# Patient Record
Sex: Male | Born: 1980
Health system: Southern US, Community
[De-identification: ages and names within clinical notes are randomized; demographics above are authoritative.]

## PROBLEM LIST (undated history)

## (undated) DIAGNOSIS — M109 Gout, unspecified: Secondary | ICD-10-CM

## (undated) DIAGNOSIS — K589 Irritable bowel syndrome without diarrhea: Secondary | ICD-10-CM

## (undated) DIAGNOSIS — Z87442 Personal history of urinary calculi: Secondary | ICD-10-CM

## (undated) DIAGNOSIS — M2550 Pain in unspecified joint: Secondary | ICD-10-CM

## (undated) DIAGNOSIS — I10 Essential (primary) hypertension: Secondary | ICD-10-CM

## (undated) DIAGNOSIS — G4730 Sleep apnea, unspecified: Secondary | ICD-10-CM

## (undated) DIAGNOSIS — N289 Disorder of kidney and ureter, unspecified: Secondary | ICD-10-CM

## (undated) DIAGNOSIS — Z8739 Personal history of other diseases of the musculoskeletal system and connective tissue: Secondary | ICD-10-CM

## (undated) DIAGNOSIS — F419 Anxiety disorder, unspecified: Secondary | ICD-10-CM

## (undated) DIAGNOSIS — N200 Calculus of kidney: Secondary | ICD-10-CM

## (undated) DIAGNOSIS — R600 Localized edema: Secondary | ICD-10-CM

## (undated) DIAGNOSIS — M5126 Other intervertebral disc displacement, lumbar region: Secondary | ICD-10-CM

## (undated) HISTORY — DX: Sleep apnea, unspecified: G47.30

## (undated) HISTORY — DX: Irritable bowel syndrome, unspecified: K58.9

## (undated) HISTORY — DX: Disorder of kidney and ureter, unspecified: N28.9

## (undated) HISTORY — DX: Essential (primary) hypertension: I10

## (undated) HISTORY — DX: Anxiety disorder, unspecified: F41.9

## (undated) HISTORY — PX: KIDNEY STONE SURGERY: SHX686

## (undated) HISTORY — DX: Calculus of kidney: N20.0

## (undated) HISTORY — DX: Pain in unspecified joint: M25.50

## (undated) HISTORY — DX: Personal history of other diseases of the musculoskeletal system and connective tissue: Z87.39

## (undated) HISTORY — PX: BACK SURGERY: SHX140

## (undated) HISTORY — DX: Gout, unspecified: M10.9

## (undated) HISTORY — DX: Localized edema: R60.0

---

## 2008-01-14 ENCOUNTER — Emergency Department (HOSPITAL_COMMUNITY): Admission: EM | Admit: 2008-01-14 | Discharge: 2008-01-14 | Payer: Self-pay | Admitting: Emergency Medicine

## 2008-01-22 ENCOUNTER — Ambulatory Visit (HOSPITAL_COMMUNITY): Admission: RE | Admit: 2008-01-22 | Discharge: 2008-01-22 | Payer: Self-pay | Admitting: Internal Medicine

## 2008-07-19 ENCOUNTER — Ambulatory Visit (HOSPITAL_COMMUNITY): Admission: RE | Admit: 2008-07-19 | Discharge: 2008-07-19 | Payer: Self-pay | Admitting: Family Medicine

## 2008-08-01 ENCOUNTER — Encounter (HOSPITAL_COMMUNITY): Admission: RE | Admit: 2008-08-01 | Discharge: 2008-08-31 | Payer: Self-pay | Admitting: Neurosurgery

## 2008-11-04 ENCOUNTER — Emergency Department (HOSPITAL_COMMUNITY): Admission: EM | Admit: 2008-11-04 | Discharge: 2008-11-04 | Payer: Self-pay | Admitting: Emergency Medicine

## 2009-01-19 ENCOUNTER — Emergency Department (HOSPITAL_COMMUNITY): Admission: EM | Admit: 2009-01-19 | Discharge: 2009-01-19 | Payer: Self-pay | Admitting: Emergency Medicine

## 2009-04-25 ENCOUNTER — Ambulatory Visit (HOSPITAL_COMMUNITY): Admission: RE | Admit: 2009-04-25 | Discharge: 2009-04-25 | Payer: Self-pay | Admitting: Neurosurgery

## 2009-11-04 ENCOUNTER — Ambulatory Visit (HOSPITAL_COMMUNITY): Admission: RE | Admit: 2009-11-04 | Discharge: 2009-11-04 | Payer: Self-pay | Admitting: Family Medicine

## 2010-03-30 ENCOUNTER — Other Ambulatory Visit: Payer: Self-pay | Admitting: Dental General Practice

## 2010-03-30 ENCOUNTER — Other Ambulatory Visit (HOSPITAL_COMMUNITY)
Admission: RE | Admit: 2010-03-30 | Discharge: 2010-03-30 | Disposition: A | Discharge status: Home / Self Care | Payer: Commercial Managed Care - PPO | Source: Ambulatory Visit | Attending: Dental General Practice | Admitting: Dental General Practice

## 2010-03-30 DIAGNOSIS — D1039 Benign neoplasm of other parts of mouth: Secondary | ICD-10-CM | POA: Insufficient documentation

## 2012-03-21 ENCOUNTER — Ambulatory Visit (HOSPITAL_COMMUNITY)
Admission: RE | Admit: 2012-03-21 | Discharge: 2012-03-21 | Disposition: A | Discharge status: Home / Self Care | Payer: 59 | Source: Ambulatory Visit | Attending: Family Medicine | Admitting: Family Medicine

## 2012-03-21 ENCOUNTER — Other Ambulatory Visit: Payer: Self-pay | Admitting: Family Medicine

## 2012-03-21 DIAGNOSIS — R9389 Abnormal findings on diagnostic imaging of other specified body structures: Secondary | ICD-10-CM | POA: Insufficient documentation

## 2012-03-21 DIAGNOSIS — R300 Dysuria: Secondary | ICD-10-CM | POA: Insufficient documentation

## 2012-03-21 DIAGNOSIS — N200 Calculus of kidney: Secondary | ICD-10-CM | POA: Insufficient documentation

## 2012-03-21 DIAGNOSIS — R319 Hematuria, unspecified: Secondary | ICD-10-CM | POA: Insufficient documentation

## 2012-06-26 ENCOUNTER — Other Ambulatory Visit: Payer: Self-pay | Admitting: *Deleted

## 2012-06-26 DIAGNOSIS — N200 Calculus of kidney: Secondary | ICD-10-CM

## 2012-06-27 ENCOUNTER — Encounter: Payer: Self-pay | Admitting: *Deleted

## 2012-06-30 LAB — STONE ANALYSIS

## 2012-07-06 ENCOUNTER — Telehealth: Payer: Self-pay | Admitting: Family Medicine

## 2012-07-06 NOTE — Telephone Encounter (Signed)
Message left to call office

## 2012-07-06 NOTE — Telephone Encounter (Signed)
Pt is calling to check on kidney stone that was sent off to see if lab had gotten back with you yet because he is concerned on what he needs to change in his diet.

## 2013-03-15 ENCOUNTER — Encounter: Payer: Self-pay | Admitting: Family Medicine

## 2013-03-15 ENCOUNTER — Ambulatory Visit (INDEPENDENT_AMBULATORY_CARE_PROVIDER_SITE_OTHER): Payer: 59 | Admitting: Family Medicine

## 2013-03-15 VITALS — BP 134/82 | Temp 99.9°F | Ht 69.0 in | Wt 263.0 lb

## 2013-03-15 DIAGNOSIS — B9789 Other viral agents as the cause of diseases classified elsewhere: Secondary | ICD-10-CM

## 2013-03-15 DIAGNOSIS — J209 Acute bronchitis, unspecified: Secondary | ICD-10-CM

## 2013-03-15 DIAGNOSIS — B349 Viral infection, unspecified: Secondary | ICD-10-CM

## 2013-03-15 MED ORDER — BENZONATATE 200 MG PO CAPS: 200.0000 mg | ORAL_CAPSULE | Freq: Three times a day (TID) | ORAL | Status: DC | PRN

## 2013-03-15 MED ORDER — HYDROCODONE-HOMATROPINE 5-1.5 MG/5ML PO SYRP: 5.0000 mL | ORAL_SOLUTION | Freq: Three times a day (TID) | ORAL | Status: DC | PRN

## 2013-03-15 MED ORDER — AZITHROMYCIN 250 MG PO TABS
ORAL_TABLET | ORAL | Status: DC
Start: 2013-03-15 — End: 2013-03-19

## 2013-03-15 NOTE — Progress Notes (Signed)
   Subjective:    Patient ID: [name] West, male    DOB: Apr 01, 1980, 33 y.o.   MRN: [id number]  Cough This is a new problem. The current episode started in the past 7 days. Associated symptoms include a fever. Associated symptoms comments: congestion.   Some wheeze Tight in lungs No chills No n v Mild headache   Review of Systems  Constitutional: Positive for fever.  Respiratory: Positive for cough.    He denies headaches wheezing difficulty breathing he denies shortness of breath he does relate fever    Objective:   Physical Exam Eardrums normal throat is normal neck supple lungs clear there is some upper bronchial chest congestion noted.       Assessment & Plan:  Viral syndrome with secondary bronchitis antibiotics prescribed warning signs discussed  Cough medication prescribed cautioned drowsiness best to use syrup only at nighttime

## 2013-03-19 ENCOUNTER — Ambulatory Visit (INDEPENDENT_AMBULATORY_CARE_PROVIDER_SITE_OTHER): Payer: 59 | Admitting: Family Medicine

## 2013-03-19 ENCOUNTER — Encounter: Payer: Self-pay | Admitting: Family Medicine

## 2013-03-19 VITALS — BP 138/90 | Temp 98.7°F | Ht 69.0 in | Wt 264.0 lb

## 2013-03-19 DIAGNOSIS — S39012A Strain of muscle, fascia and tendon of lower back, initial encounter: Secondary | ICD-10-CM

## 2013-03-19 DIAGNOSIS — M542 Cervicalgia: Secondary | ICD-10-CM

## 2013-03-19 DIAGNOSIS — J209 Acute bronchitis, unspecified: Secondary | ICD-10-CM

## 2013-03-19 MED ORDER — METHOCARBAMOL 500 MG PO TABS: 500.0000 mg | ORAL_TABLET | Freq: Four times a day (QID) | ORAL | Status: DC | PRN

## 2013-03-19 MED ORDER — LEVOFLOXACIN 500 MG PO TABS: 500.0000 mg | ORAL_TABLET | Freq: Every day | ORAL | Status: DC

## 2013-03-19 NOTE — Progress Notes (Signed)
   Subjective:    Patient ID: [name] West, male    DOB: July 24, 1980, 33 y.o.   MRN: [id number]  HPI Pt is here today for a f/u on 3/5. He was seen for bronchitis.   Last night he had a low grade fever. Please see below  He does now have back pain from coughing so much. The cough is getting better though.  He also relates straining a muscle in his back causes pain discomfort spasms. Denies any injury otherwise. States it happened innocently.   Review of Systems Denies high fevers chills sweats he does relate congestion coughing drainage. He had flulike illness last week him through the weekend increased congestion and coughing no wheezing or difficulty breathing    Objective:   Physical Exam Eardrums normal sinus normal throat normal neck supple lungs clear heart regular low back mild tenderness slight decreased range of motion due to pain       Assessment & Plan:  Muscle strain/ligament strain-muscle relaxer was prescribed. He will use this it should help cautioned drowsiness. Stretching exercises recommended as well. He was warned that muscle relaxers is not something he should be taking ongoing.  Mild progressive bronchitis with sinus infections which antibiotics should gradually get better with this.

## 2014-01-02 ENCOUNTER — Ambulatory Visit: Payer: 59 | Admitting: Family Medicine

## 2014-09-13 ENCOUNTER — Ambulatory Visit (INDEPENDENT_AMBULATORY_CARE_PROVIDER_SITE_OTHER): Payer: PRIVATE HEALTH INSURANCE | Admitting: Nurse Practitioner

## 2014-09-13 ENCOUNTER — Encounter: Payer: Self-pay | Admitting: Nurse Practitioner

## 2014-09-13 VITALS — BP 132/76 | Ht 69.0 in | Wt 283.4 lb

## 2014-09-13 DIAGNOSIS — R609 Edema, unspecified: Secondary | ICD-10-CM | POA: Diagnosis not present

## 2014-09-13 DIAGNOSIS — R002 Palpitations: Secondary | ICD-10-CM

## 2014-09-13 DIAGNOSIS — R5383 Other fatigue: Secondary | ICD-10-CM

## 2014-09-13 DIAGNOSIS — Z13220 Encounter for screening for lipoid disorders: Secondary | ICD-10-CM

## 2014-09-13 DIAGNOSIS — F419 Anxiety disorder, unspecified: Secondary | ICD-10-CM | POA: Diagnosis not present

## 2014-09-13 DIAGNOSIS — R0683 Snoring: Secondary | ICD-10-CM | POA: Diagnosis not present

## 2014-09-13 DIAGNOSIS — F430 Acute stress reaction: Secondary | ICD-10-CM

## 2014-09-13 DIAGNOSIS — F411 Generalized anxiety disorder: Secondary | ICD-10-CM

## 2014-09-13 MED ORDER — CITALOPRAM HYDROBROMIDE 20 MG PO TABS: ORAL_TABLET | ORAL | Status: DC

## 2014-09-13 MED ORDER — HYDROCHLOROTHIAZIDE 25 MG PO TABS: 25.0000 mg | ORAL_TABLET | Freq: Every day | ORAL | Status: DC

## 2014-09-13 MED ORDER — CLONAZEPAM 0.5 MG PO TABS: ORAL_TABLET | ORAL | Status: DC

## 2014-09-14 ENCOUNTER — Encounter: Payer: Self-pay | Admitting: Nurse Practitioner

## 2014-09-14 DIAGNOSIS — F430 Acute stress reaction: Secondary | ICD-10-CM

## 2014-09-14 DIAGNOSIS — F411 Generalized anxiety disorder: Secondary | ICD-10-CM | POA: Insufficient documentation

## 2014-09-14 NOTE — Progress Notes (Signed)
Subjective:  Presents for c/o trouble going to sleep. May take several hours. Wakes up 1-2 times per night but goes back to sleep easily. Fatigue during the day especially in the afternoon. Job requires significant driving. Wife present today. Loud snoring with pauses. Steady weight gain over past 5 years. Under tremendous stress with his job. Has a recent episode where he got upset one morning at work, immediately became flushed with pounding heart rate and mildly increased heart rate which lasted for hours. Later in the day had good news and symptoms immediately subsided. No CP/ischemic type pain or SOB with activity. Edema lower legs at end of work day. No cough or orthopnea. No acid reflux or abd pain. Mild emotional lability. No added salt to diet but eats out frequently. His father probably has heart disease but has refused work up. [place] questionnaire indicates symptoms of OSA.   Objective:   BP 132/76 mmHg  Ht 5\' 9"  (1.753 m)  Wt 283 lb 6 oz (128.538 kg)  BMI 41.83 kg/m2 NAD. Alert, oriented. Significant central obesity noted with large waist circumference. Lungs clear. Heart RRR. No murmur or gallop. EKG normal. Carotids no bruits or thrills. Lower extremities no edema. Abdomen obese, non distended, non tender.   Assessment:  Problem List Items Addressed This Visit      Other   Anxiety as acute reaction to exceptional stress   Relevant Medications   citalopram (CELEXA) 20 MG tablet   Other Relevant Orders   EKG 12-Lead    Other Visit Diagnoses    Palpitations    -  Primary    Relevant Orders    EKG 12-Lead    Peripheral edema        Relevant Orders    Hepatic function panel    Basic metabolic panel    Snoring        Relevant Orders    Ambulatory referral to Sleep Studies    TSH    Other fatigue        Relevant Orders    Ambulatory referral to Sleep Studies    Lipid screening        Relevant Orders    Lipid panel      Plan:  Meds ordered this encounter  Medications   . citalopram (CELEXA) 20 MG tablet    Sig: 1/2 tab po qhs x 6 d then one po qhs    Dispense:  30 tablet    Refill:  0    Order Specific Question:  Supervising Provider    Answer:  Mikey Kirschner [[id number]]  . hydrochlorothiazide (HYDRODIURIL) 25 MG tablet    Sig: Take 1 tablet (25 mg total) by mouth daily. For fluid and BP    Dispense:  30 tablet    Refill:  2    Order Specific Question:  Supervising Provider    Answer:  Mikey Kirschner [[id number]]  . clonazePAM (KLONOPIN) 0.5 MG tablet    Sig: 1/2-1 tab po BID prn anxiety    Dispense:  30 tablet    Refill:  0    Order Specific Question:  Supervising Provider    Answer:  Mikey Kirschner [[id number]]   Discussed importance of weight loss esp around abd area. Limit sodium in diet. Discussed importance of stress reduction. Referred for HBST for probable OSA. Cautioned patient about risk of falling asleep while driving with untreated OSA.  Return in about 1 month (around 10/13/2014).

## 2014-09-15 LAB — BASIC METABOLIC PANEL
BUN / CREAT RATIO: 13 (ref 8–19)
BUN: 12 mg/dL (ref 6–20)
CO2: 26 mmol/L (ref 18–29)
CREATININE: 0.92 mg/dL (ref 0.76–1.27)
Calcium: 9.5 mg/dL (ref 8.7–10.2)
Chloride: 99 mmol/L (ref 97–108)
GFR calc Af Amer: 124 mL/min/{1.73_m2} (ref >59)
GFR, EST NON AFRICAN AMERICAN: 107 mL/min/{1.73_m2} (ref >59)
Glucose: 93 mg/dL (ref 65–99)
Potassium: 5 mmol/L (ref 3.5–5.2)
SODIUM: 140 mmol/L (ref 134–144)

## 2014-09-15 LAB — HEPATIC FUNCTION PANEL
ALBUMIN: 4 g/dL (ref 3.5–5.5)
ALK PHOS: 97 IU/L (ref 39–117)
ALT: 29 IU/L (ref 0–44)
AST: 18 IU/L (ref 0–40)
BILIRUBIN, DIRECT: 0.16 mg/dL (ref 0.00–0.40)
Bilirubin Total: 0.5 mg/dL (ref 0.0–1.2)
TOTAL PROTEIN: 6.6 g/dL (ref 6.0–8.5)

## 2014-09-15 LAB — LIPID PANEL
Chol/HDL Ratio: 4.7 ratio units (ref 0.0–5.0)
Cholesterol, Total: 178 mg/dL (ref 100–199)
HDL: 38 mg/dL — ABNORMAL LOW (ref >39)
LDL Calculated: 126 mg/dL — ABNORMAL HIGH (ref 0–99)
Triglycerides: 69 mg/dL (ref 0–149)
VLDL Cholesterol Cal: 14 mg/dL (ref 5–40)

## 2014-09-15 LAB — TSH: TSH: 2.18 u[IU]/mL (ref 0.450–4.500)

## 2014-09-17 ENCOUNTER — Encounter: Payer: Self-pay | Admitting: Nurse Practitioner

## 2014-10-04 ENCOUNTER — Other Ambulatory Visit (HOSPITAL_COMMUNITY): Payer: Self-pay | Admitting: Respiratory Therapy

## 2014-10-04 DIAGNOSIS — R0683 Snoring: Secondary | ICD-10-CM

## 2014-10-04 DIAGNOSIS — S058X9A Other injuries of unspecified eye and orbit, initial encounter: Secondary | ICD-10-CM

## 2014-10-14 ENCOUNTER — Encounter: Payer: Self-pay | Admitting: Nurse Practitioner

## 2014-10-14 ENCOUNTER — Ambulatory Visit (INDEPENDENT_AMBULATORY_CARE_PROVIDER_SITE_OTHER): Payer: PRIVATE HEALTH INSURANCE | Admitting: Nurse Practitioner

## 2014-10-14 VITALS — BP 122/76 | Ht 69.0 in | Wt 273.2 lb

## 2014-10-14 DIAGNOSIS — F411 Generalized anxiety disorder: Secondary | ICD-10-CM

## 2014-10-14 DIAGNOSIS — R609 Edema, unspecified: Secondary | ICD-10-CM | POA: Diagnosis not present

## 2014-10-14 DIAGNOSIS — F419 Anxiety disorder, unspecified: Secondary | ICD-10-CM | POA: Diagnosis not present

## 2014-10-14 DIAGNOSIS — F430 Acute stress reaction: Principal | ICD-10-CM

## 2014-10-14 MED ORDER — HYDROCHLOROTHIAZIDE 25 MG PO TABS: 25.0000 mg | ORAL_TABLET | Freq: Every day | ORAL | Status: DC

## 2014-10-14 MED ORDER — CITALOPRAM HYDROBROMIDE 20 MG PO TABS: ORAL_TABLET | ORAL | Status: DC

## 2014-10-14 MED ORDER — CLONAZEPAM 0.5 MG PO TABS: ORAL_TABLET | ORAL | Status: DC

## 2014-10-14 NOTE — Progress Notes (Signed)
Subjective:  Presents for recheck. Doing well on Celexa. Has only taken 2 klonopin at the very beginning before the Celexa started working. No further palpitations. No CP/ischemic type pain or SOB. Sleeping better. Has improved his diet. Has been called about his HBST but has not responded at this point.   Objective:   BP 122/76 mmHg  Ht 5\' 9"  (1.753 m)  Wt 273 lb 4 oz (123.945 kg)  BMI 40.33 kg/m2 NAD. Alert, oriented. Lungs clear. Heart RRR. Weight loss 10 lbs since last visit.   Assessment:  Problem List Items Addressed This Visit      Other   Anxiety as acute reaction to exceptional stress - Primary   Relevant Medications   citalopram (CELEXA) 20 MG tablet    Other Visit Diagnoses    Peripheral edema          Plan:  Meds ordered this encounter  Medications  . citalopram (CELEXA) 20 MG tablet    Sig: one po qhs    Dispense:  30 tablet    Refill:  5    Order Specific Question:  Supervising Provider    Answer:  Mikey Kirschner [[id number]]  . clonazePAM (KLONOPIN) 0.5 MG tablet    Sig: 1/2-1 tab po BID prn anxiety    Dispense:  30 tablet    Refill:  0    Order Specific Question:  Supervising Provider    Answer:  Mikey Kirschner [[id number]]  . hydrochlorothiazide (HYDRODIURIL) 25 MG tablet    Sig: Take 1 tablet (25 mg total) by mouth daily. For fluid and BP    Dispense:  30 tablet    Refill:  5    Order Specific Question:  Supervising Provider    Answer:  Mikey Kirschner [[id number]]   Continue current meds and weight loss efforts.  Return in about 6 months (around 04/14/2015) for recheck. Call back sooner if needed.

## 2015-03-12 ENCOUNTER — Other Ambulatory Visit: Payer: Self-pay | Admitting: *Deleted

## 2015-03-12 MED ORDER — OSELTAMIVIR PHOSPHATE 75 MG PO CAPS: ORAL_CAPSULE | ORAL | Status: DC

## 2015-04-14 ENCOUNTER — Encounter: Payer: Self-pay | Admitting: Family Medicine

## 2015-04-14 ENCOUNTER — Ambulatory Visit (INDEPENDENT_AMBULATORY_CARE_PROVIDER_SITE_OTHER): Payer: PRIVATE HEALTH INSURANCE | Admitting: Family Medicine

## 2015-04-14 VITALS — BP 126/78 | Ht 69.0 in | Wt 269.5 lb

## 2015-04-14 DIAGNOSIS — F419 Anxiety disorder, unspecified: Secondary | ICD-10-CM

## 2015-04-14 DIAGNOSIS — R609 Edema, unspecified: Secondary | ICD-10-CM | POA: Diagnosis not present

## 2015-04-14 DIAGNOSIS — F411 Generalized anxiety disorder: Secondary | ICD-10-CM

## 2015-04-14 DIAGNOSIS — F430 Acute stress reaction: Secondary | ICD-10-CM

## 2015-04-14 MED ORDER — CLONAZEPAM 0.5 MG PO TABS: ORAL_TABLET | ORAL | Status: DC

## 2015-04-14 MED ORDER — CITALOPRAM HYDROBROMIDE 20 MG PO TABS: ORAL_TABLET | ORAL | Status: DC

## 2015-04-14 MED ORDER — HYDROCHLOROTHIAZIDE 25 MG PO TABS: 25.0000 mg | ORAL_TABLET | Freq: Every day | ORAL | Status: DC

## 2015-04-14 NOTE — Progress Notes (Signed)
Subjective:    Patient ID: [name] West, male    DOB: 1980/10/22, 35 y.o.   MRN: [id number]  Anxiety Presents for follow-up visit. Patient reports no chest pain or confusion. Symptoms occur occasionally. The severity of symptoms is mild. The quality of sleep is fair.   Compliance with medications is 76-100%.   Patient states that he has no concerns at this time.   Stress levels overall doing well trying to eat healthy try to lose weight try to exercise some in addition to this sleeping okay denies any chest tightness pressure pain shortness breath. Patient does use HCTZ for pedal edema states his pressure and edema is been doing well  Review of Systems  Constitutional: Negative for activity change, appetite change and fatigue.  HENT: Negative for congestion.   Respiratory: Negative for cough.   Cardiovascular: Negative for chest pain.  Gastrointestinal: Negative for abdominal pain.  Endocrine: Negative for polydipsia and polyphagia.  Neurological: Negative for weakness.  Psychiatric/Behavioral: Negative for confusion.       Objective:   Physical Exam  Constitutional: He appears well-nourished. No distress.  Cardiovascular: Normal rate, regular rhythm and normal heart sounds.   No murmur heard. Pulmonary/Chest: Effort normal and breath sounds normal. No respiratory distress.  Musculoskeletal: He exhibits no edema.  Lymphadenopathy:    He has no cervical adenopathy.  Neurological: He is alert.  Psychiatric: His behavior is normal.  Vitals reviewed.         Assessment & Plan:  Pedal edema-HCTZ helping patient taking it every day but not taking potassium check met 7  Intermittent dizziness only happened twice precautions on how to avoid this were discussed. Follow-up if ongoing troubles.  Hyperlipidemia recheck in the fall  Anxiety issues Celexa-takes daily- and Klonopin-does not use often- doing well continue these

## 2015-04-21 ENCOUNTER — Other Ambulatory Visit: Payer: Self-pay | Admitting: Family Medicine

## 2015-04-21 LAB — BASIC METABOLIC PANEL
BUN: 14 mg/dL (ref 7–25)
CHLORIDE: 105 mmol/L (ref 98–110)
CO2: 27 mmol/L (ref 20–31)
CREATININE: 0.73 mg/dL (ref 0.60–1.35)
Calcium: 8.9 mg/dL (ref 8.6–10.3)
GLUCOSE: 99 mg/dL (ref 65–99)
Potassium: 4.5 mmol/L (ref 3.5–5.3)
Sodium: 140 mmol/L (ref 135–146)

## 2015-10-14 ENCOUNTER — Ambulatory Visit: Payer: PRIVATE HEALTH INSURANCE | Admitting: Family Medicine

## 2015-11-07 ENCOUNTER — Ambulatory Visit (INDEPENDENT_AMBULATORY_CARE_PROVIDER_SITE_OTHER): Payer: 59 | Admitting: Family Medicine

## 2015-11-07 ENCOUNTER — Encounter: Payer: Self-pay | Admitting: Family Medicine

## 2015-11-07 VITALS — BP 130/82 | Ht 69.0 in | Wt 267.4 lb

## 2015-11-07 DIAGNOSIS — E7849 Other hyperlipidemia: Secondary | ICD-10-CM

## 2015-11-07 DIAGNOSIS — E784 Other hyperlipidemia: Secondary | ICD-10-CM

## 2015-11-07 DIAGNOSIS — Z87442 Personal history of urinary calculi: Secondary | ICD-10-CM

## 2015-11-07 DIAGNOSIS — G4733 Obstructive sleep apnea (adult) (pediatric): Secondary | ICD-10-CM

## 2015-11-07 MED ORDER — CLONAZEPAM 0.5 MG PO TABS: ORAL_TABLET | ORAL | 5 refills | Status: DC

## 2015-11-07 MED ORDER — CITALOPRAM HYDROBROMIDE 20 MG PO TABS: ORAL_TABLET | ORAL | 5 refills | Status: DC

## 2015-11-07 NOTE — Progress Notes (Signed)
   Subjective:    Patient ID: [name] West, male    DOB: Jun 04, 1980, 35 y.o.   MRN: [id number]  Anxiety  Presents for follow-up visit.    Patient would like to discuss HCTZ, and discuss blood pressure being elevated during periods of pain and have sleep study rescheduled, and tonsil stones.  He states his moods overall doing good denies being depressed states medication helps keep him even Patient does take take HCTZ to prevent kidney stones he has calcium oxalate stones we talked at length about how to prevent knees Patient states blood pressure does go up during times of pain but other times blood pressures good Patient does state he gets stones in his, so region that bothers him but does not cause any pain or discomfort he just wonders if this is normal Patient does state he pauses breathing when he sleeps plus also snores he had recent surgery and they told him during recovery he had apnea spells. Patient needs sleep study. Never got the other sleep study.   Review of Systems     Objective:   Physical Exam  Lungs clear no crackles heart regular pulse normal moderate obesity extremities no edema skin warm dry      Assessment & Plan:  Tonsillar stones-supportive measures only no need for any type of surgery or antibiotics gargle when necessary  Probable sleep apnea him in his history. Recommend sleep study split protocol if possible more than likely will need CPAP  Kidney stones-we went over dietary measures adequate fluid intake plus continue HCTZ lab work ordered  Patient was told is normal for blood pressure go up during times of pain, I do not recommend antihypertensive  Stress-related issues doing well overall moods are doing good continue current measures  Obesity patient was encouraged to lose weight exercise watch diet  This patient would benefit from a sleep study in the facility. Not home sleep study.

## 2015-11-11 ENCOUNTER — Encounter: Payer: Self-pay | Admitting: Family Medicine

## 2015-11-11 NOTE — Progress Notes (Signed)
So be it 

## 2015-11-14 ENCOUNTER — Encounter: Payer: Self-pay | Admitting: Family Medicine

## 2015-11-14 ENCOUNTER — Other Ambulatory Visit: Payer: Self-pay | Admitting: Family Medicine

## 2015-11-14 LAB — BASIC METABOLIC PANEL WITH GFR
BUN: 13 mg/dL (ref 7–25)
CO2: 26 mmol/L (ref 20–31)
Calcium: 8.9 mg/dL (ref 8.6–10.3)
Chloride: 103 mmol/L (ref 98–110)
Creat: 1.01 mg/dL (ref 0.60–1.35)
Glucose, Bld: 91 mg/dL (ref 65–99)
Potassium: 4.2 mmol/L (ref 3.5–5.3)
Sodium: 140 mmol/L (ref 135–146)

## 2015-11-14 LAB — HEPATIC FUNCTION PANEL
ALT: 18 U/L (ref 9–46)
AST: 16 U/L (ref 10–40)
Albumin: 4 g/dL (ref 3.6–5.1)
Alkaline Phosphatase: 91 U/L (ref 40–115)
Bilirubin, Direct: 0.1 mg/dL
Indirect Bilirubin: 0.5 mg/dL (ref 0.2–1.2)
Total Bilirubin: 0.6 mg/dL (ref 0.2–1.2)
Total Protein: 6.8 g/dL (ref 6.1–8.1)

## 2015-11-14 LAB — LIPID PANEL
Cholesterol: 173 mg/dL (ref 125–200)
HDL: 40 mg/dL
LDL Cholesterol: 121 mg/dL
Total CHOL/HDL Ratio: 4.3 ratio
Triglycerides: 60 mg/dL
VLDL: 12 mg/dL

## 2016-01-15 ENCOUNTER — Other Ambulatory Visit (HOSPITAL_BASED_OUTPATIENT_CLINIC_OR_DEPARTMENT_OTHER): Payer: Self-pay

## 2016-01-15 DIAGNOSIS — R0683 Snoring: Secondary | ICD-10-CM

## 2016-01-15 DIAGNOSIS — G4730 Sleep apnea, unspecified: Secondary | ICD-10-CM

## 2016-02-01 NOTE — Procedures (Deleted)
NAME: [name] West DATE OF BIRTH:  [birth year] MEDICAL RECORD NUMBER [id number]  LOCATION: [facility]  PHYSICIAN: Deetta Perla  DATE OF STUDY: 02/01/2016  SLEEP STUDY TYPE: Out of Center Sleep Test                REFERRING PHYSICIAN: Kathyrn Drown, MD  INDICATION FOR STUDY: ***  EPWORTH SLEEPINESS SCORE:   HEIGHT:    WEIGHT:      There is no height or weight on file to calculate BMI.  NECK SIZE:   in.  MEDICATIONS: ***  IMPRESSION:  ***    RECOMMENDATION:  ***   [name], [place], American Board of Sleep Medicine  ELECTRONICALLY SIGNED ON:  02/01/2016, 7:43 PM [place] PH: [phone]   FX: [phone] [place]

## 2016-02-15 ENCOUNTER — Ambulatory Visit: Payer: Commercial Managed Care - PPO

## 2016-02-22 ENCOUNTER — Ambulatory Visit: Payer: 59 | Attending: Family Medicine

## 2016-02-22 DIAGNOSIS — R0683 Snoring: Secondary | ICD-10-CM | POA: Diagnosis not present

## 2016-02-22 DIAGNOSIS — G4733 Obstructive sleep apnea (adult) (pediatric): Secondary | ICD-10-CM | POA: Insufficient documentation

## 2016-02-22 DIAGNOSIS — G4730 Sleep apnea, unspecified: Secondary | ICD-10-CM

## 2016-02-27 ENCOUNTER — Telehealth: Payer: Self-pay | Admitting: Family Medicine

## 2016-02-27 MED ORDER — AMOXICILLIN 500 MG PO CAPS: 500.0000 mg | ORAL_CAPSULE | Freq: Three times a day (TID) | ORAL | 0 refills | Status: DC

## 2016-02-27 NOTE — Telephone Encounter (Signed)
Nurse's-I did discuss this case with the wife patient having head congestion drainage but no fever or chills some sore throat. Hard to know for certain what the patient has more than likely a virus. I did let the family know that we would send a prescription to [facility]-amoxicillin 500 mg 1 3 times a day for 10 days-please put on the note to pharmacy for them to have prescription on hold for the next 7 days and family will call if they need. Family also knows to follow-up with [place] if having further trouble

## 2016-02-27 NOTE — Telephone Encounter (Signed)
Wife called, she was here with her son Eulas Post on Monday and Dr. Nicki Reaper said to call if other family members got sick too.  Mali having congestion, and sorethroat. Doesn't think any fever.  Wasn't sure if he was getting flu or strep?  Can we call in rx to [facility].  Wife can be reached at [phone].

## 2016-02-27 NOTE — Telephone Encounter (Signed)
Son has the flu and strep-[place]

## 2016-02-27 NOTE — Telephone Encounter (Signed)
Prescription sent electronically to pharmacy. Patient notified. 

## 2016-03-19 NOTE — Procedures (Unsigned)
  [place] A. Merlene Laughter, MD     [email or web address]             HOME SLEEP STUDY  LOCATION: [facility]    LOCATION: SLEEP LAB FACILITY: [facility]   PHYSICIAN: Bianco Cange A. Merlene Laughter, M.D.   DATE OF STUDY: 02/22/16    INDICATIONS: Snoring and fatigue   MEDICATIONS:  Prior to Admission medications   Medication Sig Start Date End Date Taking? Authorizing Provider  amoxicillin (AMOXIL) 500 MG capsule Take 1 capsule (500 mg total) by mouth 3 (three) times daily. 02/27/16   Kathyrn Drown, MD  citalopram (CELEXA) 20 MG tablet one po qhs 11/07/15   Kathyrn Drown, MD  clonazePAM (KLONOPIN) 0.5 MG tablet 1/2-1 tab po BID prn anxiety 11/07/15   Kathyrn Drown, MD  hydrochlorothiazide (HYDRODIURIL) 25 MG tablet Take 1 tablet (25 mg total) by mouth daily. For fluid and BP 04/14/15   Kathyrn Drown, MD  methocarbamol (ROBAXIN) 500 MG tablet Take 1 tablet (500 mg total) by mouth every 6 (six) hours as needed for muscle spasms. Patient not taking: Reported on 11/07/2015 03/19/13   Kathyrn Drown, MD          RESPIRATORY DATA:  Baseline oxygen saturation is 99 %. The lowest saturation is 82 %. The diagnostic AHI is 20.5.   IMPRESSION:  Moderate sleep apnea is noted. A formal CPAP titration is recommended.   Thanks for this referral.  Ladislaus Repsher A. Merlene Laughter, M.D. Diplomat, Tax adviser of Sleep Medicine.     Delano Metz, MD Diplomate, American Board of Sleep Medicine.

## 2016-04-22 ENCOUNTER — Telehealth: Payer: Self-pay | Admitting: Family Medicine

## 2016-04-22 DIAGNOSIS — G4730 Sleep apnea, unspecified: Secondary | ICD-10-CM

## 2016-04-22 NOTE — Telephone Encounter (Signed)
See results under encounters

## 2016-04-22 NOTE — Telephone Encounter (Signed)
'  s sleep study show sleep apnea. It is recommended for a titration study. I recommend that this be done next. Typically it takes 3 weeks to get the results of that. If the patient has not heard within 4 weeks of his tests please have the patient call back. So he can get the results of the tests.

## 2016-04-22 NOTE — Telephone Encounter (Signed)
Pt is wanting to know the results to his sleep study. Please call pt after 3 due to work hours.

## 2016-04-23 NOTE — Telephone Encounter (Signed)
Results discussed with patient. Patient advised sleep study show sleep apnea. It is recommended for a titration study. Dr Nicki Reaper recommends that this be done next. Typically it takes 3 weeks to get the results of that. If the patient has not heard within 4 weeks of his tests please have the patient call back. So he can get the results of the tests.  Patient verbalized understanding. Referral for titration study ordered in [facility].

## 2016-05-07 ENCOUNTER — Ambulatory Visit (INDEPENDENT_AMBULATORY_CARE_PROVIDER_SITE_OTHER): Payer: 59 | Admitting: Family Medicine

## 2016-05-07 ENCOUNTER — Encounter: Payer: Self-pay | Admitting: Family Medicine

## 2016-05-07 VITALS — BP 134/90 | Ht 69.0 in | Wt 268.2 lb

## 2016-05-07 DIAGNOSIS — F411 Generalized anxiety disorder: Secondary | ICD-10-CM | POA: Diagnosis not present

## 2016-05-07 DIAGNOSIS — E669 Obesity, unspecified: Secondary | ICD-10-CM | POA: Diagnosis not present

## 2016-05-07 DIAGNOSIS — F430 Acute stress reaction: Secondary | ICD-10-CM

## 2016-05-07 MED ORDER — SERTRALINE HCL 100 MG PO TABS: 100.0000 mg | ORAL_TABLET | Freq: Every day | ORAL | 5 refills | Status: DC

## 2016-05-07 MED ORDER — CLONAZEPAM 0.5 MG PO TABS: ORAL_TABLET | ORAL | 2 refills | Status: DC

## 2016-05-07 NOTE — Progress Notes (Signed)
   Subjective:    Patient ID: [name] West, male    DOB: [birth year], 36 y.o.   MRN: [id number]  Anxiety  Presents for follow-up visit. Symptoms occur most days.    Patient denies getting depressed. Denies being suicidal. States his overall energy level is fair. He is now working a new job starts 5:30 PM at night and goes to 4 AM Monday through Friday this causes him not to be around his children or his wife during the week which adds to stress unable to exercise try to watch how he eats Patient states Anxiety is getting worse. He relates he gets stressed at times and gets on edge. Patient not suicidal denies being depressed  Review of Systems Denies fever chills vomiting diarrhea headaches    Objective:   Physical Exam Lungs clear heart regular pulse normal BP good       Assessment & Plan:  Stress-related issues/mild anxiety-no depression-stopped Celexa. Try Zoloft 100 mg start off one half tablet a day for the first week then 1 daily if problems let us know. Klonopin only necessary. Do not use frequently caution drowsiness  Gave him information on mindfulness with anger he will be looking at adding a book  He will give [place] update in 6 weeks how this is doing he does not want to do any counseling currently follow-up in 6 months

## 2016-05-25 ENCOUNTER — Encounter: Payer: Self-pay | Admitting: Family Medicine

## 2016-05-25 ENCOUNTER — Ambulatory Visit (INDEPENDENT_AMBULATORY_CARE_PROVIDER_SITE_OTHER): Payer: 59 | Admitting: Family Medicine

## 2016-05-25 VITALS — BP 122/82 | Ht 69.0 in | Wt 268.0 lb

## 2016-05-25 DIAGNOSIS — M25572 Pain in left ankle and joints of left foot: Secondary | ICD-10-CM

## 2016-05-25 MED ORDER — HYDROCODONE-ACETAMINOPHEN 10-325 MG PO TABS: 1.0000 | ORAL_TABLET | ORAL | 0 refills | Status: DC | PRN

## 2016-05-25 MED ORDER — COLCHICINE 0.6 MG PO TABS: 0.6000 mg | ORAL_TABLET | Freq: Two times a day (BID) | ORAL | 2 refills | Status: DC

## 2016-05-25 MED ORDER — PREDNISONE 20 MG PO TABS: ORAL_TABLET | ORAL | 0 refills | Status: DC

## 2016-05-25 NOTE — Progress Notes (Signed)
   Subjective:    Patient ID: [name] West, male    DOB: 17-Feb-1980, 36 y.o.   MRN: [id number]  HPI Patient arrives with c/o left ankle pain- new onset with no know injury. Patient relates swelling in the ankle along with tenderness pain discomfort denies fever chills sweats denies any injury denies any scratches states has a family history gout. Symptoms been present over the past few days only.  Review of Systems    see above. Objective:   Physical Exam  Fine normal knee normal calf normal ankle does have mild swelling and tenderness lateral aspect foot is normal  Lungs clear heart regular    Assessment & Plan:  Gout-probable-lab work ordered, prednisone taper, colchicine when necessary, pain medication when necessary caution drowsiness home use only

## 2016-05-25 NOTE — Patient Instructions (Signed)

## 2016-05-26 LAB — CBC WITH DIFFERENTIAL/PLATELET
BASOS: 0 %
Basophils Absolute: 0 10*3/uL (ref 0.0–0.2)
EOS (ABSOLUTE): 0.3 10*3/uL (ref 0.0–0.4)
EOS: 3 %
HEMATOCRIT: 40.8 % (ref 37.5–51.0)
Hemoglobin: 13.4 g/dL (ref 13.0–17.7)
Immature Grans (Abs): 0 10*3/uL (ref 0.0–0.1)
Immature Granulocytes: 0 %
LYMPHS ABS: 2.4 10*3/uL (ref 0.7–3.1)
Lymphs: 22 %
MCH: 27.3 pg (ref 26.6–33.0)
MCHC: 32.8 g/dL (ref 31.5–35.7)
MCV: 83 fL (ref 79–97)
MONOS ABS: 0.7 10*3/uL (ref 0.1–0.9)
Monocytes: 7 %
Neutrophils Absolute: 7.4 10*3/uL — ABNORMAL HIGH (ref 1.4–7.0)
Neutrophils: 68 %
PLATELETS: 274 10*3/uL (ref 150–379)
RBC: 4.9 x10E6/uL (ref 4.14–5.80)
RDW: 14.7 % (ref 12.3–15.4)
WBC: 10.8 10*3/uL (ref 3.4–10.8)

## 2016-05-26 LAB — URIC ACID: URIC ACID: 6.8 mg/dL (ref 3.7–8.6)

## 2016-05-26 LAB — SEDIMENTATION RATE: Sed Rate: 17 mm/hr — ABNORMAL HIGH (ref 0–15)

## 2016-05-27 ENCOUNTER — Other Ambulatory Visit: Payer: Self-pay | Admitting: Family Medicine

## 2016-07-08 ENCOUNTER — Encounter (HOSPITAL_BASED_OUTPATIENT_CLINIC_OR_DEPARTMENT_OTHER): Payer: Self-pay

## 2016-07-08 DIAGNOSIS — G4733 Obstructive sleep apnea (adult) (pediatric): Secondary | ICD-10-CM

## 2016-08-04 DIAGNOSIS — L02415 Cutaneous abscess of right lower limb: Secondary | ICD-10-CM | POA: Diagnosis not present

## 2016-10-31 ENCOUNTER — Ambulatory Visit: Payer: 59 | Attending: Family Medicine | Admitting: Neurology

## 2016-10-31 DIAGNOSIS — G4733 Obstructive sleep apnea (adult) (pediatric): Secondary | ICD-10-CM

## 2016-11-13 NOTE — Procedures (Signed)
[place] A. Merlene Laughter, MD     [email or web address]             NOCTURNAL POLYSOMNOGRAPHY   LOCATION: [facility]   Patient Name: West West Study Date: 10/31/2016 Gender: Male D.O.B: June 30, 1980 Age (years): 36 Referring Provider: Sallee Lange Height (inches): 69 Interpreting Physician: Phillips Odor MD, ABSM Weight (lbs): 275 RPSGT: Rosebud Poles BMI: 41 MRN: [id number] Neck Size: 18.50 CLINICAL INFORMATION The patient is referred for a CPAP titration to treat sleep apnea.     Date of NPSG, Split Night or HST:  SLEEP STUDY TECHNIQUE As per the AASM Manual for the Scoring of Sleep and Associated Events v2.3 (April 2016) with a hypopnea requiring 4% desaturations.  The channels recorded and monitored were frontal, central and occipital EEG, electrooculogram (EOG), submentalis EMG (chin), nasal and oral airflow, thoracic and abdominal wall motion, anterior tibialis EMG, snore microphone, electrocardiogram, and pulse oximetry. Continuous positive airway pressure (CPAP) was initiated at the beginning of the study and titrated to treat sleep-disordered breathing.  MEDICATIONS Medications self-administered by patient taken the night of the study : N/A  Current Outpatient Prescriptions:  .  clonazePAM (KLONOPIN) 0.5 MG tablet, 1/2-1 tab po BID prn anxiety, Disp: 20 tablet, Rfl: 2 .  colchicine 0.6 MG tablet, Take 1 tablet (0.6 mg total) by mouth 2 (two) times daily., Disp: 20 tablet, Rfl: 2 .  hydrochlorothiazide (HYDRODIURIL) 25 MG tablet, TAKE 1 TABLET DAILY FOR FLUID AND BLOOD PRESSURE., Disp: 30 tablet, Rfl: 5 .  HYDROcodone-acetaminophen (NORCO) 10-325 MG tablet, Take 1 tablet by mouth every 4 (four) hours as needed., Disp: 35 tablet, Rfl: 0 .  methocarbamol (ROBAXIN) 500 MG tablet, Take 1 tablet (500 mg total) by mouth every 6 (six) hours as needed for muscle spasms., Disp: 30 tablet, Rfl: 1 .  predniSONE (DELTASONE) 20 MG tablet, 3qd for 3d then 2qd  for 3d then 1qd for 3d, Disp: 18 tablet, Rfl: 0 .  sertraline (ZOLOFT) 100 MG tablet, Take 1 tablet (100 mg total) by mouth daily., Disp: 30 tablet, Rfl: 5   TECHNICIAN COMMENTS Comments added by technician: Patient tolerated CPAP well. CPAP titration started at 5 cm of H20 and increased to 12 cm of H20 due to events and snoring. Suboptimal pressure was obtained due to patient inability to maintain sleep in supine position. REM rebound noticed throughout . Bradycardia noticed throughout study  Comments added by scorer: N/A RESPIRATORY PARAMETERS Optimal PAP Pressure (cm):  AHI at Optimal Pressure (/hr): N/A Overall Minimal O2 (%): 80.00 Supine % at Optimal Pressure (%): N/A Minimal O2 at Optimal Pressure (%): 80.00   SLEEP ARCHITECTURE The study was initiated at 9:43:47 PM and ended at 6:12:38 AM.  Sleep onset time was 2.8 minutes and the sleep efficiency was 96.1%. The total sleep time was 489.0 minutes.  The patient spent 1.84% of the night in stage N1 sleep, 34.76% in stage N2 sleep, 19.84% in stage N3 and 43.56% in REM.Stage REM latency was 154.5 minutes  Wake after sleep onset was 17.0. Alpha intrusion was absent. Supine sleep was 11.66%.  CARDIAC DATA The 2 lead EKG demonstrated sinus rhythm. The mean heart rate was N/A beats per minute. Other EKG findings include: None. LEG MOVEMENT DATA The total Periodic Limb Movements of Sleep (PLMS) were 0. The PLMS index was 0.00. A PLMS index of <15 is considered normal in adults.  IMPRESSIONS 1. This is a successful CPAP titration study with the patient doing well on pressures between 10-12.  I recommend 10 as [name] West pressure that is effective. 2. Abnormal sleep architecture is observed with increase in the REM percentage suggestive of REM rebound phenomena.  This can be typically seen in medication (stimulants or antidepressants)  withdrawal or possibly CPAP titration.  Delano Metz, MD Diplomate, American Board of Sleep  Medicine.  ELECTRONICALLY SIGNED ON:  11/13/2016, 6:31 PM [place] PH: [phone]   FX: [phone] [place]

## 2016-11-25 ENCOUNTER — Telehealth: Payer: Self-pay | Admitting: Family Medicine

## 2016-11-25 NOTE — Telephone Encounter (Signed)
Wife calling to see if we have to results of the sleep study he had about a month ago.  I don't see a DPR signed but patient can be reached after 5:00 at home #.

## 2016-11-25 NOTE — Telephone Encounter (Signed)
I did call regarding getting machine etc. phone number that was listed and left the message.  Sleep study was posted over the weekend.  It shows that the titration CPAP worked best at 12 cm.  I recommend a CPAP machine 12 cm setting.  Prescription was written.  Please have [place] assist patient regarding getting CPAP machine-a copy of the titration study was made

## 2016-11-26 NOTE — Telephone Encounter (Signed)
Spoke with patient's wife and informed her per Dr.Scott Luking-CPAP does not help with restless leg symptoms then we can prescribe medication for this.  I would recommend trying CPAP first.  Also it would be helpful to make sure the patient does a follow-up visit several weeks after starting CPAP machine to see how he is doing symptomatically regarding his sleep, restless legs etc.. Patient's wife verbalized understanding and stated that they would like to get the CPAP at [facility].

## 2016-11-26 NOTE — Telephone Encounter (Signed)
Spoke with patient's wife and informed her per Dr.Scott Luking-  Sleep study was posted over the weekend.  It shows that the titration CPAP worked best at 12 cm.  I recommend a CPAP machine 12 cm setting.  Prescription was written. Patient's wife verbalized understanding and asked if the CPAP would help with patient's restless leg syndrome. Patient wife stated when they did his sleep study they told him that he had restless leg syndrome. She wants to know if CPAP does not help then what can he do for his restless leg? Please advise?

## 2016-11-26 NOTE — Telephone Encounter (Signed)
If the CPAP does not help with restless leg symptoms then we can prescribe medication for this.  I would recommend trying CPAP first.  Also it would be helpful to make sure the patient does a follow-up visit several weeks after starting CPAP machine to see how he is doing symptomatically regarding his sleep, restless legs etc.

## 2016-11-29 NOTE — Telephone Encounter (Signed)
Gave order to Dr. Nicki Reaper to sign Printed all required documentation to fax once order is signed

## 2016-12-07 NOTE — Telephone Encounter (Signed)
CPAP ordered through [employer]

## 2016-12-22 ENCOUNTER — Encounter: Payer: Self-pay | Admitting: Family Medicine

## 2016-12-22 DIAGNOSIS — G4730 Sleep apnea, unspecified: Secondary | ICD-10-CM | POA: Insufficient documentation

## 2016-12-28 DIAGNOSIS — G4733 Obstructive sleep apnea (adult) (pediatric): Secondary | ICD-10-CM | POA: Diagnosis not present

## 2017-01-25 ENCOUNTER — Ambulatory Visit (INDEPENDENT_AMBULATORY_CARE_PROVIDER_SITE_OTHER): Payer: 59 | Admitting: Family Medicine

## 2017-01-25 VITALS — BP 142/100 | Ht 69.0 in | Wt 297.0 lb

## 2017-01-25 DIAGNOSIS — R29898 Other symptoms and signs involving the musculoskeletal system: Secondary | ICD-10-CM | POA: Diagnosis not present

## 2017-01-25 DIAGNOSIS — M5432 Sciatica, left side: Secondary | ICD-10-CM

## 2017-01-25 DIAGNOSIS — M5440 Lumbago with sciatica, unspecified side: Secondary | ICD-10-CM | POA: Diagnosis not present

## 2017-01-25 MED ORDER — DICLOFENAC SODIUM 75 MG PO TBEC: 75.0000 mg | DELAYED_RELEASE_TABLET | Freq: Two times a day (BID) | ORAL | 5 refills | Status: DC

## 2017-01-25 MED ORDER — PREDNISONE 20 MG PO TABS: ORAL_TABLET | ORAL | 0 refills | Status: DC

## 2017-01-25 NOTE — Progress Notes (Signed)
   Subjective:    Patient ID: [name] West, male    DOB: April 11, 1980, 37 y.o.   MRN: [id number]  Back Pain  This is a new problem. Episode onset: 5 days. The pain is present in the gluteal and lumbar spine (left leg). Pertinent negatives include no abdominal pain or weakness. Treatments tried: ibuprofen and aleve.   Patient has had back trouble for years he is seen [place] multiple different times over the years he has intermittent sciatica down the leg Over the past several days severe pain down the left leg along with weakness he states he is also had numbness in the leg and diminished range of motion because of the pain it is affecting his sleep as well as daytime activities he is tried anti-inflammatories with some success he rates the pain as a 7 out of 10 when sitting still 10 out of 10 with activity   Review of Systems  Constitutional: Negative for activity change.  Gastrointestinal: Negative for abdominal pain and vomiting.  Musculoskeletal: Positive for back pain.  Neurological: Negative for weakness.  Psychiatric/Behavioral: Negative for confusion.       Objective:   Physical Exam  Constitutional: He appears well-nourished.  Cardiovascular: Normal rate, regular rhythm and normal heart sounds.  No murmur heard. Pulmonary/Chest: Effort normal and breath sounds normal.  Musculoskeletal: He exhibits no edema.  Lymphadenopathy:    He has no cervical adenopathy.  Neurological: He is alert.  Psychiatric: His behavior is normal.  Vitals reviewed.  He has brisk patellar reflex on the left side normal on the right side he has diminished strength in the great toe extensor he is able to walk on his heels with some difficulty The right side has no weakness Positive straight leg raise on the left side      Assessment & Plan:  Sciatica with evidence of muscle weakness certainly concerning for a possibility of a severe impingement of the nerve because of the muscle weakness it is  imperative to move forward with getting a MRI as well as consultation with neurosurgery he will use anti-inflammatories does not want pain medicine

## 2017-01-25 NOTE — Patient Instructions (Signed)
DASH Eating Plan DASH stands for "Dietary Approaches to Stop Hypertension." The DASH eating plan is a healthy eating plan that has been shown to reduce high blood pressure (hypertension). It may also reduce your risk for type 2 diabetes, heart disease, and stroke. The DASH eating plan may also help with weight loss. What are tips for following this plan? General guidelines  Avoid eating more than 2,300 mg (milligrams) of salt (sodium) a day. If you have hypertension, you may need to reduce your sodium intake to 1,500 mg a day.  Limit alcohol intake to no more than 1 drink a day for nonpregnant women and 2 drinks a day for men. One drink equals 12 oz of beer, 5 oz of wine, or 1 oz of hard liquor.  Work with your health care provider to maintain a healthy body weight or to lose weight. Ask what an ideal weight is for you.  Get at least 30 minutes of exercise that causes your heart to beat faster (aerobic exercise) most days of the week. Activities may include walking, swimming, or biking.  Work with your health care provider or diet and nutrition specialist (dietitian) to adjust your eating plan to your individual calorie needs. Reading food labels  Check food labels for the amount of sodium per serving. Choose foods with less than 5 percent of the Daily Value of sodium. Generally, foods with less than 300 mg of sodium per serving fit into this eating plan.  To find whole grains, look for the word "whole" as the first word in the ingredient list. Shopping  Buy products labeled as "low-sodium" or "no salt added."  Buy fresh foods. Avoid canned foods and premade or frozen meals. Cooking  Avoid adding salt when cooking. Use salt-free seasonings or herbs instead of table salt or sea salt. Check with your health care provider or pharmacist before using salt substitutes.  Do not fry foods. Cook foods using healthy methods such as baking, boiling, grilling, and broiling instead.  Cook with  heart-healthy oils, such as olive, canola, soybean, or sunflower oil. Meal planning   Eat a balanced diet that includes: ? 5 or more servings of fruits and vegetables each day. At each meal, try to fill half of your plate with fruits and vegetables. ? Up to 6-8 servings of whole grains each day. ? Less than 6 oz of lean meat, poultry, or fish each day. A 3-oz serving of meat is about the same size as a deck of cards. One egg equals 1 oz. ? 2 servings of low-fat dairy each day. ? A serving of nuts, seeds, or beans 5 times each week. ? Heart-healthy fats. Healthy fats called Omega-3 fatty acids are found in foods such as flaxseeds and coldwater fish, like sardines, salmon, and mackerel.  Limit how much you eat of the following: ? Canned or prepackaged foods. ? Food that is high in trans fat, such as fried foods. ? Food that is high in saturated fat, such as fatty meat. ? Sweets, desserts, sugary drinks, and other foods with added sugar. ? Full-fat dairy products.  Do not salt foods before eating.  Try to eat at least 2 vegetarian meals each week.  Eat more home-cooked food and less restaurant, buffet, and fast food.  When eating at a restaurant, ask that your food be prepared with less salt or no salt, if possible. What foods are recommended? The items listed may not be a complete list. Talk with your dietitian about what   dietary choices are best for you. Grains Whole-grain or whole-wheat bread. Whole-grain or whole-wheat pasta. Brown rice. Oatmeal. Quinoa. Bulgur. Whole-grain and low-sodium cereals. Pita bread. Low-fat, low-sodium crackers. Whole-wheat flour tortillas. Vegetables Fresh or frozen vegetables (raw, steamed, roasted, or grilled). Low-sodium or reduced-sodium tomato and vegetable juice. Low-sodium or reduced-sodium tomato sauce and tomato paste. Low-sodium or reduced-sodium canned vegetables. Fruits All fresh, dried, or frozen fruit. Canned fruit in natural juice (without  added sugar). Meat and other protein foods Skinless chicken or turkey. Ground chicken or turkey. Pork with fat trimmed off. Fish and seafood. Egg whites. Dried beans, peas, or lentils. Unsalted nuts, nut butters, and seeds. Unsalted canned beans. Lean cuts of beef with fat trimmed off. Low-sodium, lean deli meat. Dairy Low-fat (1%) or fat-free (skim) milk. Fat-free, low-fat, or reduced-fat cheeses. Nonfat, low-sodium ricotta or cottage cheese. Low-fat or nonfat yogurt. Low-fat, low-sodium cheese. Fats and oils Soft margarine without trans fats. Vegetable oil. Low-fat, reduced-fat, or light mayonnaise and salad dressings (reduced-sodium). Canola, safflower, olive, soybean, and sunflower oils. Avocado. Seasoning and other foods Herbs. Spices. Seasoning mixes without salt. Unsalted popcorn and pretzels. Fat-free sweets. What foods are not recommended? The items listed may not be a complete list. Talk with your dietitian about what dietary choices are best for you. Grains Baked goods made with fat, such as croissants, muffins, or some breads. Dry pasta or rice meal packs. Vegetables Creamed or fried vegetables. Vegetables in a cheese sauce. Regular canned vegetables (not low-sodium or reduced-sodium). Regular canned tomato sauce and paste (not low-sodium or reduced-sodium). Regular tomato and vegetable juice (not low-sodium or reduced-sodium). Pickles. Olives. Fruits Canned fruit in a light or heavy syrup. Fried fruit. Fruit in cream or butter sauce. Meat and other protein foods Fatty cuts of meat. Ribs. Fried meat. Bacon. Sausage. Bologna and other processed lunch meats. Salami. Fatback. Hotdogs. Bratwurst. Salted nuts and seeds. Canned beans with added salt. Canned or smoked fish. Whole eggs or egg yolks. Chicken or turkey with skin. Dairy Whole or 2% milk, cream, and half-and-half. Whole or full-fat cream cheese. Whole-fat or sweetened yogurt. Full-fat cheese. Nondairy creamers. Whipped toppings.  Processed cheese and cheese spreads. Fats and oils Butter. Stick margarine. Lard. Shortening. Ghee. Bacon fat. Tropical oils, such as coconut, palm kernel, or palm oil. Seasoning and other foods Salted popcorn and pretzels. Onion salt, garlic salt, seasoned salt, table salt, and sea salt. Worcestershire sauce. Tartar sauce. Barbecue sauce. Teriyaki sauce. Soy sauce, including reduced-sodium. Steak sauce. Canned and packaged gravies. Fish sauce. Oyster sauce. Cocktail sauce. Horseradish that you find on the shelf. Ketchup. Mustard. Meat flavorings and tenderizers. Bouillon cubes. Hot sauce and Tabasco sauce. Premade or packaged marinades. Premade or packaged taco seasonings. Relishes. Regular salad dressings. Where to find more information:  National Heart, Lung, and Blood Institute: www.nhlbi.nih.gov  American Heart Association: www.heart.org Summary  The DASH eating plan is a healthy eating plan that has been shown to reduce high blood pressure (hypertension). It may also reduce your risk for type 2 diabetes, heart disease, and stroke.  With the DASH eating plan, you should limit salt (sodium) intake to 2,300 mg a day. If you have hypertension, you may need to reduce your sodium intake to 1,500 mg a day.  When on the DASH eating plan, aim to eat more fresh fruits and vegetables, whole grains, lean proteins, low-fat dairy, and heart-healthy fats.  Work with your health care provider or diet and nutrition specialist (dietitian) to adjust your eating plan to your individual   calorie needs. This information is not intended to replace advice given to you by your health care provider. Make sure you discuss any questions you have with your health care provider. Document Released: 12/17/2010 Document Revised: 12/22/2015 Document Reviewed: 12/22/2015 Elsevier Interactive Patient Education  2018 [employer]  

## 2017-01-28 DIAGNOSIS — G4733 Obstructive sleep apnea (adult) (pediatric): Secondary | ICD-10-CM | POA: Diagnosis not present

## 2017-01-31 ENCOUNTER — Ambulatory Visit (HOSPITAL_COMMUNITY)
Admission: RE | Admit: 2017-01-31 | Discharge: 2017-01-31 | Disposition: A | Discharge status: Home / Self Care | Payer: 59 | Source: Ambulatory Visit | Attending: Family Medicine | Admitting: Family Medicine

## 2017-01-31 DIAGNOSIS — M545 Low back pain: Secondary | ICD-10-CM | POA: Diagnosis not present

## 2017-01-31 DIAGNOSIS — M5126 Other intervertebral disc displacement, lumbar region: Secondary | ICD-10-CM | POA: Insufficient documentation

## 2017-01-31 DIAGNOSIS — R531 Weakness: Secondary | ICD-10-CM | POA: Insufficient documentation

## 2017-01-31 DIAGNOSIS — M5442 Lumbago with sciatica, left side: Secondary | ICD-10-CM | POA: Insufficient documentation

## 2017-01-31 DIAGNOSIS — R29898 Other symptoms and signs involving the musculoskeletal system: Secondary | ICD-10-CM

## 2017-01-31 DIAGNOSIS — M5440 Lumbago with sciatica, unspecified side: Secondary | ICD-10-CM

## 2017-01-31 DIAGNOSIS — M5432 Sciatica, left side: Secondary | ICD-10-CM

## 2017-02-01 ENCOUNTER — Other Ambulatory Visit: Payer: Self-pay

## 2017-02-01 DIAGNOSIS — M519 Unspecified thoracic, thoracolumbar and lumbosacral intervertebral disc disorder: Secondary | ICD-10-CM

## 2017-02-03 ENCOUNTER — Encounter: Payer: Self-pay | Admitting: Family Medicine

## 2017-02-09 ENCOUNTER — Telehealth: Payer: Self-pay | Admitting: Family Medicine

## 2017-02-09 ENCOUNTER — Encounter: Payer: Self-pay | Admitting: Family Medicine

## 2017-02-09 NOTE — Telephone Encounter (Signed)
See MyChart message

## 2017-02-09 NOTE — Telephone Encounter (Signed)
Nurses please see phone message

## 2017-02-09 NOTE — Telephone Encounter (Signed)
Pt is wanting to know if he can get a refill on the prednisone. Pt sees neuro on march 4. Please advise.

## 2017-02-10 MED ORDER — PREDNISONE 20 MG PO TABS: ORAL_TABLET | ORAL | 0 refills | Status: DC

## 2017-02-10 NOTE — Telephone Encounter (Signed)
3qd for 3d then 2qd for 3d then 1qd for 7d #22-I do not recommend prednisone beyond this length

## 2017-02-10 NOTE — Telephone Encounter (Signed)
See message.

## 2017-02-10 NOTE — Telephone Encounter (Signed)
Prescription sent electronically to pharmacy.  Patient notified an verbalized understanding

## 2017-02-28 DIAGNOSIS — G4733 Obstructive sleep apnea (adult) (pediatric): Secondary | ICD-10-CM | POA: Diagnosis not present

## 2017-03-14 ENCOUNTER — Other Ambulatory Visit: Payer: Self-pay | Admitting: Family Medicine

## 2017-03-14 ENCOUNTER — Other Ambulatory Visit: Payer: Self-pay | Admitting: Nurse Practitioner

## 2017-03-14 DIAGNOSIS — M5126 Other intervertebral disc displacement, lumbar region: Secondary | ICD-10-CM | POA: Diagnosis not present

## 2017-03-14 DIAGNOSIS — M5416 Radiculopathy, lumbar region: Secondary | ICD-10-CM | POA: Diagnosis not present

## 2017-03-14 DIAGNOSIS — M545 Low back pain: Secondary | ICD-10-CM | POA: Diagnosis not present

## 2017-03-15 NOTE — Telephone Encounter (Signed)
Patient may have this +1 additional refill the patient will need a follow-up office visit regarding this medication

## 2017-03-18 DIAGNOSIS — M5116 Intervertebral disc disorders with radiculopathy, lumbar region: Secondary | ICD-10-CM | POA: Diagnosis not present

## 2017-03-18 DIAGNOSIS — M5126 Other intervertebral disc displacement, lumbar region: Secondary | ICD-10-CM | POA: Diagnosis not present

## 2017-03-18 DIAGNOSIS — M4726 Other spondylosis with radiculopathy, lumbar region: Secondary | ICD-10-CM | POA: Diagnosis not present

## 2017-03-18 DIAGNOSIS — M5136 Other intervertebral disc degeneration, lumbar region: Secondary | ICD-10-CM | POA: Diagnosis not present

## 2017-03-28 DIAGNOSIS — G4733 Obstructive sleep apnea (adult) (pediatric): Secondary | ICD-10-CM | POA: Diagnosis not present

## 2017-04-15 DIAGNOSIS — G4733 Obstructive sleep apnea (adult) (pediatric): Secondary | ICD-10-CM | POA: Diagnosis not present

## 2017-04-26 ENCOUNTER — Ambulatory Visit (INDEPENDENT_AMBULATORY_CARE_PROVIDER_SITE_OTHER): Payer: 59 | Admitting: *Deleted

## 2017-04-26 DIAGNOSIS — Z23 Encounter for immunization: Secondary | ICD-10-CM | POA: Diagnosis not present

## 2017-04-28 DIAGNOSIS — G4733 Obstructive sleep apnea (adult) (pediatric): Secondary | ICD-10-CM | POA: Diagnosis not present

## 2017-06-28 DIAGNOSIS — G4733 Obstructive sleep apnea (adult) (pediatric): Secondary | ICD-10-CM | POA: Diagnosis not present

## 2017-07-23 ENCOUNTER — Other Ambulatory Visit: Payer: Self-pay | Admitting: Family Medicine

## 2017-07-25 NOTE — Telephone Encounter (Signed)
3 rf needs ov

## 2017-07-28 DIAGNOSIS — G4733 Obstructive sleep apnea (adult) (pediatric): Secondary | ICD-10-CM | POA: Diagnosis not present

## 2017-08-28 DIAGNOSIS — G4733 Obstructive sleep apnea (adult) (pediatric): Secondary | ICD-10-CM | POA: Diagnosis not present

## 2017-09-19 ENCOUNTER — Ambulatory Visit: Payer: 59 | Admitting: Family Medicine

## 2017-09-20 ENCOUNTER — Ambulatory Visit (INDEPENDENT_AMBULATORY_CARE_PROVIDER_SITE_OTHER): Payer: 59 | Admitting: Family Medicine

## 2017-09-20 ENCOUNTER — Encounter: Payer: Self-pay | Admitting: Family Medicine

## 2017-09-20 VITALS — BP 132/80 | Temp 99.1°F | Ht 69.0 in | Wt 300.8 lb

## 2017-09-20 DIAGNOSIS — R109 Unspecified abdominal pain: Secondary | ICD-10-CM

## 2017-09-20 NOTE — Progress Notes (Signed)
   Subjective:    Patient ID: [name] West, male    DOB: Jun 11, 1980, 37 y.o.   MRN: [id number]  Abdominal Pain  This is a new problem. Episode onset: 3 days. He has tried nothing for the symptoms.  pt sneezed real hard and felt something pop in abdomen on left side. Now every time he coughs he feels pain in that area.   Patient relates severe pain in the abdomen hurts with movement hurts with lifting denies any internal pain denies nausea vomiting diarrhea sweats chills.  Patient is heavyset. Very nice patient  Review of Systems  Gastrointestinal: Positive for abdominal pain.  Denies any chest tightness pressure pain shortness breath nausea vomiting diarrhea bloody stools.  Denies any other problems other than what stated above.     Objective:   Physical Exam  Lungs are clear respiratory rate is normal heart regular no murmurs abdomen soft there is tenderness in the left mid abdominal quadrant region no guarding or rebound.  Patient does have increased pain with certain movements and rotation as well as with Valsalva maneuver I do not find evidence of hernia  15 minutes was spent with patient today discussing healthcare issues which they came.  More than 50% of this visit-total duration of visit-was spent in counseling and coordination of care.  Please see diagnosis regarding the focus of this coordination and care     Assessment & Plan:  Very nice patient Significant abdominal muscle strain Avoid any heavy lifting over the course of next 1 to 2 weeks Could take up to a couple weeks go away I do not recommend any type of MRI or x-ray at this time I doubt any type of hernia. May use anti-inflammatory as needed over the next 7 to 14 days Follow-up if ongoing trouble

## 2017-09-28 DIAGNOSIS — G4733 Obstructive sleep apnea (adult) (pediatric): Secondary | ICD-10-CM | POA: Diagnosis not present

## 2017-10-10 ENCOUNTER — Ambulatory Visit (INDEPENDENT_AMBULATORY_CARE_PROVIDER_SITE_OTHER): Payer: 59 | Admitting: Family Medicine

## 2017-10-10 ENCOUNTER — Encounter: Payer: Self-pay | Admitting: Family Medicine

## 2017-10-10 VITALS — BP 138/88 | Temp 98.8°F | Ht 69.0 in | Wt 296.0 lb

## 2017-10-10 DIAGNOSIS — R21 Rash and other nonspecific skin eruption: Secondary | ICD-10-CM

## 2017-10-10 MED ORDER — TERBINAFINE HCL 250 MG PO TABS: ORAL_TABLET | ORAL | 0 refills | Status: DC

## 2017-10-10 MED ORDER — KETOCONAZOLE 2 % EX CREA: 1.0000 "application " | TOPICAL_CREAM | Freq: Two times a day (BID) | CUTANEOUS | 4 refills | Status: DC

## 2017-10-10 NOTE — Progress Notes (Signed)
   Subjective:    Patient ID: [name] West, male    DOB: 11-02-1980, 37 y.o.   MRN: [id number]  HPI  Patient is here today with a rash with blisters under stomach. He works outside and sweats all day long.  Works in [employer] rash   Outside dock working hard  Rash pruritic at times.  Has tried over-the-counter medications without substantial helping frustrating the patient.  Gets sweaty a lot with his job.      Review of Systems No headache, no major weight loss or weight gain, no chest pain no back pain abdominal pain no change in bowel habits complete ROS otherwise negative     Objective:   Physical Exam  Alert vitals stable, NAD. Blood pressure good on repeat. HEENT normal. Lungs clear. Heart regular rate and rhythm. Intertrigo rash present with low abdominal panniculus.  Impression tinea versus Candida rash discussed.  Ketoconazole cream.  Oral agents also prescribed due to severity.  Rationale discussed.      Assessment & Plan:

## 2017-10-20 ENCOUNTER — Encounter (INDEPENDENT_AMBULATORY_CARE_PROVIDER_SITE_OTHER): Payer: 59

## 2017-10-24 ENCOUNTER — Ambulatory Visit (INDEPENDENT_AMBULATORY_CARE_PROVIDER_SITE_OTHER): Payer: 59 | Admitting: Family Medicine

## 2017-10-24 ENCOUNTER — Encounter (INDEPENDENT_AMBULATORY_CARE_PROVIDER_SITE_OTHER): Payer: Self-pay | Admitting: Family Medicine

## 2017-10-24 VITALS — BP 130/84 | HR 55 | Temp 98.0°F | Ht 67.0 in | Wt 291.0 lb

## 2017-10-24 DIAGNOSIS — Z0289 Encounter for other administrative examinations: Secondary | ICD-10-CM

## 2017-10-24 DIAGNOSIS — I10 Essential (primary) hypertension: Secondary | ICD-10-CM

## 2017-10-24 DIAGNOSIS — R5383 Other fatigue: Secondary | ICD-10-CM

## 2017-10-24 DIAGNOSIS — Z9189 Other specified personal risk factors, not elsewhere classified: Secondary | ICD-10-CM

## 2017-10-24 DIAGNOSIS — R0602 Shortness of breath: Secondary | ICD-10-CM

## 2017-10-24 DIAGNOSIS — Z1331 Encounter for screening for depression: Secondary | ICD-10-CM | POA: Diagnosis not present

## 2017-10-24 DIAGNOSIS — Z87442 Personal history of urinary calculi: Secondary | ICD-10-CM | POA: Diagnosis not present

## 2017-10-24 DIAGNOSIS — Z6841 Body Mass Index (BMI) 40.0 and over, adult: Secondary | ICD-10-CM

## 2017-10-25 LAB — CBC WITH DIFFERENTIAL/PLATELET
BASOS ABS: 0 10*3/uL (ref 0.0–0.2)
Basos: 0 %
EOS (ABSOLUTE): 0.3 10*3/uL (ref 0.0–0.4)
Eos: 3 %
Hematocrit: 39.7 % (ref 37.5–51.0)
Hemoglobin: 12.8 g/dL — ABNORMAL LOW (ref 13.0–17.7)
IMMATURE GRANULOCYTES: 1 %
Immature Grans (Abs): 0.1 10*3/uL (ref 0.0–0.1)
LYMPHS ABS: 2.2 10*3/uL (ref 0.7–3.1)
Lymphs: 20 %
MCH: 27 pg (ref 26.6–33.0)
MCHC: 32.2 g/dL (ref 31.5–35.7)
MCV: 84 fL (ref 79–97)
MONOS ABS: 0.6 10*3/uL (ref 0.1–0.9)
Monocytes: 6 %
NEUTROS PCT: 70 %
Neutrophils Absolute: 7.9 10*3/uL — ABNORMAL HIGH (ref 1.4–7.0)
PLATELETS: 281 10*3/uL (ref 150–450)
RBC: 4.74 x10E6/uL (ref 4.14–5.80)
RDW: 14.5 % (ref 12.3–15.4)
WBC: 11.1 10*3/uL — AB (ref 3.4–10.8)

## 2017-10-25 LAB — VITAMIN B12: Vitamin B-12: 388 pg/mL (ref 232–1245)

## 2017-10-25 LAB — COMPREHENSIVE METABOLIC PANEL
ALBUMIN: 3.9 g/dL (ref 3.5–5.5)
ALK PHOS: 99 IU/L (ref 39–117)
ALT: 31 IU/L (ref 0–44)
AST: 17 IU/L (ref 0–40)
Albumin/Globulin Ratio: 1.4 (ref 1.2–2.2)
BILIRUBIN TOTAL: 0.3 mg/dL (ref 0.0–1.2)
BUN / CREAT RATIO: 18 (ref 9–20)
BUN: 13 mg/dL (ref 6–20)
CHLORIDE: 102 mmol/L (ref 96–106)
CO2: 24 mmol/L (ref 20–29)
Calcium: 9.2 mg/dL (ref 8.7–10.2)
Creatinine, Ser: 0.71 mg/dL — ABNORMAL LOW (ref 0.76–1.27)
GFR calc Af Amer: 138 mL/min/{1.73_m2} (ref >59)
GFR calc non Af Amer: 119 mL/min/{1.73_m2} (ref >59)
GLOBULIN, TOTAL: 2.7 g/dL (ref 1.5–4.5)
GLUCOSE: 84 mg/dL (ref 65–99)
Potassium: 4 mmol/L (ref 3.5–5.2)
SODIUM: 140 mmol/L (ref 134–144)
Total Protein: 6.6 g/dL (ref 6.0–8.5)

## 2017-10-25 LAB — LIPID PANEL
CHOL/HDL RATIO: 4.3 ratio (ref 0.0–5.0)
Cholesterol, Total: 163 mg/dL (ref 100–199)
HDL: 38 mg/dL — AB (ref >39)
LDL Calculated: 105 mg/dL — ABNORMAL HIGH (ref 0–99)
Triglycerides: 99 mg/dL (ref 0–149)
VLDL Cholesterol Cal: 20 mg/dL (ref 5–40)

## 2017-10-25 LAB — VITAMIN D 25 HYDROXY (VIT D DEFICIENCY, FRACTURES): Vit D, 25-Hydroxy: 18 ng/mL — ABNORMAL LOW (ref 30.0–100.0)

## 2017-10-25 LAB — TSH: TSH: 1.79 u[IU]/mL (ref 0.450–4.500)

## 2017-10-25 LAB — FOLATE: Folate: 6.5 ng/mL (ref >3.0)

## 2017-10-25 LAB — T4, FREE: FREE T4: 1.21 ng/dL (ref 0.82–1.77)

## 2017-10-25 LAB — HEMOGLOBIN A1C
ESTIMATED AVERAGE GLUCOSE: 114 mg/dL
HEMOGLOBIN A1C: 5.6 % (ref 4.8–5.6)

## 2017-10-25 LAB — T3: T3 TOTAL: 106 ng/dL (ref 71–180)

## 2017-10-25 LAB — INSULIN, RANDOM: INSULIN: 24.8 u[IU]/mL (ref 2.6–24.9)

## 2017-10-25 NOTE — Progress Notes (Signed)
.  Office: [phone]  /  Fax: [phone]   HPI:   Chief Complaint: OBESITY  [name] West (MR# [id number]) is a 37 y.o. male who presents on 10/25/2017 for obesity evaluation and treatment. Current BMI is Body mass index is 45.58 kg/m.[name] West [name] has struggled with obesity for years and has been unsuccessful in either losing weight or maintaining long term weight loss. [name] lives with his wife and two sons. [name] attended our information session and states he is currently in the action stage of change and ready to dedicate time achieving and maintaining a healthier weight. His wife will be joining our program today with him. 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 [name] states his family eats meals together he thinks his family will eat healthier with  him his desired weight loss is 96 lbs he has been heavy most of  his life he started gaining weight after high school his heaviest weight ever was 301 lbs. he skips meals frequently he is frequently drinking liquids with calories he has binge eating behaviors he struggles with emotional eating    Fatigue [name] feels his energy is lower than it should be. This has worsened with weight gain and has not worsened recently. [name] admits to daytime somnolence and admits to waking up still tired. Patient is at risk for obstructive sleep apnea. Patent has a history of symptoms of daytime fatigue, morning fatigue and hypertension. Patient generally gets 5 to 7 hours of sleep per night, and states they generally have restless sleep. Snoring is present. Apneic episodes are present. Epworth Sleepiness Score is 7  Dyspnea on exertion [name] notes increasing shortness of breath with exercising and seems to be worsening over time with weight gain. He notes getting out of breath sooner with activity than he used to. This has not gotten worse recently. [name]  denies orthopnea.  Hypertension [name] A West is a 37 y.o. male with hypertension. His blood pressure is stable on medications. [name] West denies chest pain He is attempting to work on weight loss to help control his blood pressure with the goal to control his hypertension with diet.  At risk for cardiovascular disease [name] is at a higher than average risk for cardiovascular disease due to obesity and hypertension. He currently denies any chest pain.  History of Nephrolithiasis [name] has a history of nephrolithiasis, with the last stone being very large and had to be surgically removed.  Depression Screen Isa's Food and Mood (modified PHQ-9) score was  Depression screen PHQ 2/9 10/24/2017  Decreased Interest 3  Down, Depressed, Hopeless 1  PHQ - 2 Score 4  Altered sleeping 3  Tired, decreased energy 3  Change in appetite 1  Feeling bad or failure about yourself  1  Trouble concentrating 0  Moving slowly or fidgety/restless 0  Suicidal thoughts 0  PHQ-9 Score 12  Difficult doing work/chores Not difficult at all    ALLERGIES: No Known Allergies  MEDICATIONS: Current Outpatient Medications on File Prior to Visit  Medication Sig Dispense Refill  . diclofenac (VOLTAREN) 75 MG EC tablet Take 1 tablet (75 mg total) by mouth 2 (two) times daily. 60 tablet 5  . hydrochlorothiazide (HYDRODIURIL) 25 MG tablet TAKE 1 TABLET ONCE DAILY FOR FLUID AND BLOOD PRESSURE. 30 tablet 2  . sertraline (ZOLOFT) 50 MG tablet Take 50 mg by mouth daily.  No current facility-administered medications on file prior to visit.     PAST MEDICAL HISTORY: Past Medical History:  Diagnosis Date  . Anxiety   . Gout   . HTN (hypertension)   . Hx of low back pain   . IBS (irritable bowel syndrome)   . Joint pain   . Kidney problem   . Kidney stones   . Leg edema   . Sleep apnea     PAST SURGICAL HISTORY: Past Surgical History:  Procedure Laterality Date  . BACK SURGERY    . KIDNEY STONE  SURGERY      SOCIAL HISTORY: Social History   Tobacco Use  . Smoking status: Never Smoker  . Smokeless tobacco: Never Used  Substance Use Topics  . Alcohol use: Yes    Comment: occas social  . Drug use: No    FAMILY HISTORY: Family History  Problem Relation Age of Onset  . Cancer Mother        Lung  . Hyperlipidemia Father   . Heart disease Father   . Kidney Stones Father   . Heart disease Paternal Grandfather 38       died of MI    ROS: Review of Systems  Constitutional: Positive for malaise/fatigue.  HENT: Positive for hearing loss and tinnitus.   Eyes:       + Floaters   Respiratory: Positive for shortness of breath (on exertion).   Cardiovascular: Negative for chest pain.  Gastrointestinal: Positive for diarrhea.       + Rectal Bleeding   Neurological: Positive for weakness.  Psychiatric/Behavioral: The patient has insomnia.        + Stress    PHYSICAL EXAM: Blood pressure 130/84, pulse (!) 55, temperature 98 F (36.7 C), temperature source Oral, height 5\' 7"  (1.702 m), weight 291 lb (132 kg), SpO2 98 %. Body mass index is 45.58 kg/m. Physical Exam  Constitutional: He is oriented to person, place, and time. He appears well-developed and well-nourished.  HENT:  Head: Normocephalic and atraumatic.  Nose: Nose normal.  Eyes: EOM are normal.  Neck: Normal range of motion. Neck supple. No thyromegaly present.  Cardiovascular: Regular rhythm. Bradycardia present.  Pulmonary/Chest: Effort normal. No respiratory distress.  Abdominal: Soft. There is no tenderness.  Musculoskeletal: Normal range of motion.  Neurological: He is alert and oriented to person, place, and time.  Skin: Skin is warm and dry.  Psychiatric: He has a normal mood and affect. His behavior is normal.  Vitals reviewed.   RECENT LABS AND TESTS: BMET    Component Value Date/Time   NA 140 10/24/2017 1015   K 4.0 10/24/2017 1015   CL 102 10/24/2017 1015   CO2 24 10/24/2017 1015    GLUCOSE 84 10/24/2017 1015   GLUCOSE 91 11/14/2015 0738   BUN 13 10/24/2017 1015   CREATININE 0.71 (L) 10/24/2017 1015   CREATININE 1.01 11/14/2015 0738   CALCIUM 9.2 10/24/2017 1015   GFRNONAA 119 10/24/2017 1015   GFRAA 138 10/24/2017 1015   Lab Results  Component Value Date   HGBA1C 5.6 10/24/2017   Lab Results  Component Value Date   INSULIN 24.8 10/24/2017   CBC    Component Value Date/Time   WBC 11.1 (H) 10/24/2017 1015   RBC 4.74 10/24/2017 1015   HGB 12.8 (L) 10/24/2017 1015   HCT 39.7 10/24/2017 1015   PLT 281 10/24/2017 1015   MCV 84 10/24/2017 1015   MCH 27.0 10/24/2017 1015   MCHC 32.2 10/24/2017  1015   RDW 14.5 10/24/2017 1015   LYMPHSABS 2.2 10/24/2017 1015   EOSABS 0.3 10/24/2017 1015   BASOSABS 0.0 10/24/2017 1015   Iron/TIBC/Ferritin/ %Sat No results found for: IRON, TIBC, FERRITIN, IRONPCTSAT Lipid Panel     Component Value Date/Time   CHOL 163 10/24/2017 1015   TRIG 99 10/24/2017 1015   HDL 38 (L) 10/24/2017 1015   CHOLHDL 4.3 10/24/2017 1015   CHOLHDL 4.3 11/14/2015 0738   VLDL 12 11/14/2015 0738   LDLCALC 105 (H) 10/24/2017 1015   Hepatic Function Panel     Component Value Date/Time   PROT 6.6 10/24/2017 1015   ALBUMIN 3.9 10/24/2017 1015   AST 17 10/24/2017 1015   ALT 31 10/24/2017 1015   ALKPHOS 99 10/24/2017 1015   BILITOT 0.3 10/24/2017 1015   BILIDIR 0.1 11/14/2015 0738   BILIDIR 0.16 09/14/2014 0958   IBILI 0.5 11/14/2015 0738      Component Value Date/Time   TSH 1.790 10/24/2017 1015   Vitamin D There are no recent lab results  ECG  shows NSR with a rate of 58 BPM INDIRECT CALORIMETER done today shows a VO2 of 455 and a REE of 3167. His calculated basal metabolic rate is 1660 thus his basal metabolic rate is better than expected.    ASSESSMENT AND PLAN: Other fatigue - Plan: EKG 12-Lead, CBC with Differential/Platelet, Hemoglobin A1c, Insulin, random, Lipid panel, VITAMIN D 25 Hydroxy (Vit-D Deficiency, Fractures),  Vitamin B12, Folate, T3, T4, free, TSH  Shortness of breath on exertion  Essential hypertension - Plan: Comprehensive metabolic panel  History of nephrolithiasis  At risk for heart disease  Screening for depression  Class 3 severe obesity with serious comorbidity and body mass index (BMI) of 45.0 to 49.9 in adult, unspecified obesity type (HCC)  PLAN:  Fatigue [name] was informed that his fatigue may be related to obesity, depression or many other causes. Labs will be ordered, and in the meanwhile [name] has agreed to work on diet, exercise and weight loss to help with fatigue. Proper sleep hygiene was discussed including the need for 7-8 hours of quality sleep each night. A sleep study was not ordered based on symptoms and Epworth score.  Dyspnea on exertion Bach's shortness of breath appears to be obesity related and exercise induced. He has agreed to work on weight loss and gradually increase exercise to treat his exercise induced shortness of breath. If [name] follows our instructions and loses weight without improvement of his shortness of breath, we will plan to refer to pulmonology. We will monitor this condition regularly. [name] agrees to this plan.  Hypertension We discussed sodium restriction, working on healthy weight loss, and a regular exercise program as the means to achieve improved blood pressure control. [name] agreed with this plan and agreed to follow up as directed. We will check labs and will  continue to monitor his blood pressure as well as his progress with the above lifestyle modifications. He will continue his medications as prescribed and will watch for signs of hypotension as he continues his lifestyle modifications.  Cardiovascular risk counseling [name] was given extended (15 minutes) coronary artery disease prevention counseling today. He is 37 y.o. male and has risk factors for heart disease including obesity and hypertension. We discussed intensive lifestyle  modifications today with an emphasis on specific weight loss instructions and strategies. Pt was also informed of the importance of increasing exercise and decreasing saturated fats to help prevent heart disease.  History of Nephrolithiasis  [name] is to work on increasing his H2O intake and avoid foods that encourage kidney stones.  Depression Screen [name] had a moderately positive depression screening. Depression is commonly associated with obesity and often results in emotional eating behaviors. We will monitor this closely and work on CBT to help improve the non-hunger eating patterns. Referral to Psychology may be required if no improvement is seen as he continues in our clinic.  Obesity [name] is currently in the action stage of change and his goal is to continue with weight loss efforts He has agreed to follow the Category 4 plan [name] has been instructed to work up to a goal of 150 minutes of combined cardio and strengthening exercise per week for weight loss and overall health benefits. We discussed the following Behavioral Modification Strategies today: increase H2O intake  [name] has agreed to follow up with our clinic in 2 weeks. He was informed of the importance of frequent follow up visits to maximize his success with intensive lifestyle modifications for his multiple health conditions. He was informed we would discuss his lab results at his next visit unless there is a critical issue that needs to be addressed sooner. [name] agreed to keep his next visit at the agreed upon time to discuss these results.    OBESITY BEHAVIORAL INTERVENTION VISIT  Today's visit was # 1   Starting weight: 291 lbs Starting date: 10/24/17 Today's weight : 291 lbs  Today's date: 10/24/2017 Total lbs lost to date: 0   ASK: We discussed the diagnosis of obesity with [name] A Sellen today and [name] agreed to give [place] permission to discuss obesity behavioral modification therapy today.  ASSESS: [name] has the  diagnosis of obesity and his BMI today is 93.57 [name] is in the action stage of change   ADVISE: [name] was educated on the multiple health risks of obesity as well as the benefit of weight loss to improve his health. He was advised of the need for long term treatment and the importance of lifestyle modifications to improve his current health and to decrease his risk of future health problems.  AGREE: Multiple dietary modification options and treatment options were discussed and  [name] agreed to follow the recommendations documented in the above note.  ARRANGE: [name] was educated on the importance of frequent visits to treat obesity as outlined per CMS and USPSTF guidelines and agreed to schedule his next follow up appointment today.   I, Doreene Nest, am acting as transcriptionist for Dennard Nip, MD   I have reviewed the above documentation for accuracy and completeness, and I agree with the above. -Dennard Nip, MD

## 2017-10-28 DIAGNOSIS — G4733 Obstructive sleep apnea (adult) (pediatric): Secondary | ICD-10-CM | POA: Diagnosis not present

## 2017-11-07 ENCOUNTER — Ambulatory Visit (INDEPENDENT_AMBULATORY_CARE_PROVIDER_SITE_OTHER): Payer: 59 | Admitting: Family Medicine

## 2017-11-07 VITALS — BP 126/74 | HR 59 | Temp 98.3°F | Ht 67.0 in | Wt 287.0 lb

## 2017-11-07 DIAGNOSIS — E559 Vitamin D deficiency, unspecified: Secondary | ICD-10-CM

## 2017-11-07 DIAGNOSIS — E782 Mixed hyperlipidemia: Secondary | ICD-10-CM | POA: Diagnosis not present

## 2017-11-07 DIAGNOSIS — D508 Other iron deficiency anemias: Secondary | ICD-10-CM

## 2017-11-07 DIAGNOSIS — Z9189 Other specified personal risk factors, not elsewhere classified: Secondary | ICD-10-CM | POA: Diagnosis not present

## 2017-11-07 DIAGNOSIS — R7303 Prediabetes: Secondary | ICD-10-CM

## 2017-11-07 DIAGNOSIS — Z6841 Body Mass Index (BMI) 40.0 and over, adult: Secondary | ICD-10-CM

## 2017-11-07 MED ORDER — VITAMIN D (ERGOCALCIFEROL) 1.25 MG (50000 UNIT) PO CAPS: 50000.0000 [IU] | ORAL_CAPSULE | ORAL | 0 refills | Status: DC

## 2017-11-07 MED ORDER — PANTOPRAZOLE SODIUM 40 MG PO TBEC: 40.0000 mg | DELAYED_RELEASE_TABLET | Freq: Every day | ORAL | 3 refills | Status: DC

## 2017-11-08 NOTE — Progress Notes (Signed)
Office: [phone]  /  Fax: [phone]   HPI:   Chief Complaint: OBESITY [name] West is here to discuss his progress with his obesity treatment plan. He is following the Category 4 plan and is following his eating plan approximately 90 % of the time. He states he is exercising 0 minutes 0 times per week. [name] West did very well with weight loss on the Category 4 plan. He states that hunger is controlled and he did not have any severe cravings. He struggled to eat all his dinner.   His weight is 287 lb (130.2 kg) today and has had a weight loss of 4 pounds over a period of 2 weeks since his last visit. He has lost 4 lbs since starting treatment with [place].  Hyperlipidemia Mixed [name] West has hyperlipidemia, with LDL mildly elevated and HDL decreased and triglycerides within normal limits. [name] West has been trying to improve his cholesterol levels with intensive lifestyle modification including a low saturated fat diet, exercise and weight loss. He denies any chest pain, claudication or myalgias. [name] West would like to try diet control.   Vitamin D Deficiency [name] West was informed that low vitamin D levels contributes to fatigue and are associated with obesity, breast, and colon cancer. He is not currently taking Vit D. [name] West will follow up for routine testing of vitamin D, at least 2-3 times per year. He denies any nausea, vomiting or muscle weakness.   Pre-Diabetes (New Dx) [name] West has a diagnosis of prediabetes. His HgA1c which is slightly increased but his fasting insulin is elevated. He does have some polyphagia but this has improved since with decreasing his simple carb intake. [name] West was informed this puts him at greater risk of developing diabetes. He is not taking metformin currently and continues to work on diet and exercise to decrease risk of diabetes. He denies nausea or hypoglycemia.  At risk for diabetes [name] West is at higher than averagerisk for developing diabetes due to his obesity and pre diabetes. He currently  denies polyuria or polydipsia.  Anemia [name] West has a diagnosis of anemia.  He notes no symptoms and is not on iron supplementation. [name] West eats iron rich foods regularly but he is taking Diclofenac twice daily often on an empty stomach. He has no family history of colon cancer. Denies melena and hematemesis.     ALLERGIES: No Known Allergies  MEDICATIONS: Current Outpatient Medications on File Prior to Visit  Medication Sig Dispense Refill  . diclofenac (VOLTAREN) 75 MG EC tablet Take 1 tablet (75 mg total) by mouth 2 (two) times daily. 60 tablet 5  . hydrochlorothiazide (HYDRODIURIL) 25 MG tablet TAKE 1 TABLET ONCE DAILY FOR FLUID AND BLOOD PRESSURE. 30 tablet 2  . sertraline (ZOLOFT) 50 MG tablet Take 50 mg by mouth daily.     No current facility-administered medications on file prior to visit.     PAST MEDICAL HISTORY: Past Medical History:  Diagnosis Date  . Anxiety   . Gout   . HTN (hypertension)   . Hx of low back pain   . IBS (irritable bowel syndrome)   . Joint pain   . Kidney problem   . Kidney stones   . Leg edema   . Sleep apnea     PAST SURGICAL HISTORY: Past Surgical History:  Procedure Laterality Date  . BACK SURGERY    . KIDNEY STONE SURGERY      SOCIAL HISTORY: Social History   Tobacco Use  . Smoking status: Never Smoker  . Smokeless tobacco: Never  Used  Substance Use Topics  . Alcohol use: Yes    Comment: occas social  . Drug use: No    FAMILY HISTORY: Family History  Problem Relation Age of Onset  . Cancer Mother        Lung  . Hyperlipidemia Father   . Heart disease Father   . Kidney Stones Father   . Heart disease Paternal Grandfather 55       died of MI    ROS: Review of Systems  Constitutional: Positive for weight loss.  Cardiovascular: Negative for chest pain and claudication.  Gastrointestinal: Negative for melena, nausea and vomiting.       Negative for hematochezia  Musculoskeletal: Negative for myalgias.       Negative for  muscle weakness  Endo/Heme/Allergies: Negative for polydipsia.       Negative for hypoglycemia Positive for polyphasia Negative for polyuria    PHYSICAL EXAM: Blood pressure 126/74, pulse (!) 59, temperature 98.3 F (36.8 C), temperature source Oral, height 5\' 7"  (1.702 m), weight 287 lb (130.2 kg), SpO2 99 %. Body mass index is 44.95 kg/m. Physical Exam  Constitutional: He is oriented to person, place, and time. He appears well-developed and well-nourished.  Cardiovascular: Normal rate.  Pulmonary/Chest: Effort normal.  Musculoskeletal: Normal range of motion.  Neurological: He is alert and oriented to person, place, and time.  Skin: Skin is warm and dry.  Psychiatric: He has a normal mood and affect. His behavior is normal.  Vitals reviewed.   RECENT LABS AND TESTS: BMET    Component Value Date/Time   NA 140 10/24/2017 1015   K 4.0 10/24/2017 1015   CL 102 10/24/2017 1015   CO2 24 10/24/2017 1015   GLUCOSE 84 10/24/2017 1015   GLUCOSE 91 11/14/2015 0738   BUN 13 10/24/2017 1015   CREATININE 0.71 (L) 10/24/2017 1015   CREATININE 1.01 11/14/2015 0738   CALCIUM 9.2 10/24/2017 1015   GFRNONAA 119 10/24/2017 1015   GFRAA 138 10/24/2017 1015   Lab Results  Component Value Date   HGBA1C 5.6 10/24/2017   Lab Results  Component Value Date   INSULIN 24.8 10/24/2017   CBC    Component Value Date/Time   WBC 11.1 (H) 10/24/2017 1015   RBC 4.74 10/24/2017 1015   HGB 12.8 (L) 10/24/2017 1015   HCT 39.7 10/24/2017 1015   PLT 281 10/24/2017 1015   MCV 84 10/24/2017 1015   MCH 27.0 10/24/2017 1015   MCHC 32.2 10/24/2017 1015   RDW 14.5 10/24/2017 1015   LYMPHSABS 2.2 10/24/2017 1015   EOSABS 0.3 10/24/2017 1015   BASOSABS 0.0 10/24/2017 1015   Iron/TIBC/Ferritin/ %Sat No results found for: IRON, TIBC, FERRITIN, IRONPCTSAT Lipid Panel     Component Value Date/Time   CHOL 163 10/24/2017 1015   TRIG 99 10/24/2017 1015   HDL 38 (L) 10/24/2017 1015   CHOLHDL 4.3  10/24/2017 1015   CHOLHDL 4.3 11/14/2015 0738   VLDL 12 11/14/2015 0738   LDLCALC 105 (H) 10/24/2017 1015   Hepatic Function Panel     Component Value Date/Time   PROT 6.6 10/24/2017 1015   ALBUMIN 3.9 10/24/2017 1015   AST 17 10/24/2017 1015   ALT 31 10/24/2017 1015   ALKPHOS 99 10/24/2017 1015   BILITOT 0.3 10/24/2017 1015   BILIDIR 0.1 11/14/2015 0738   BILIDIR 0.16 09/14/2014 0958   IBILI 0.5 11/14/2015 0738      Component Value Date/Time   TSH 1.790 10/24/2017 1015  TSH 2.180 09/14/2014 0958   Results for Benjamin, [name] West A (MRN [id number]) as of 11/08/2017 13:58  Ref. Range 10/24/2017 10:15  Vitamin D, 25-Hydroxy Latest Ref Range: 30.0 - 100.0 ng/mL 18.0 (L)   ASSESSMENT AND PLAN: Vitamin D deficiency - Plan: Vitamin D, Ergocalciferol, (DRISDOL) 50000 units CAPS capsule  Prediabetes  Mixed hyperlipidemia  Other iron deficiency anemia - Plan: pantoprazole (PROTONIX) 40 MG tablet  At risk for diabetes mellitus  Class 3 severe obesity with serious comorbidity and body mass index (BMI) of 45.0 to 49.9 in adult, unspecified obesity type ([place])  PLAN: Hyperlipidemia [name] West was informed of the American Heart Association Guidelines emphasizing intensive lifestyle modifications as the first line treatment for hyperlipidemia. He will work on diet and exercise and we will recheck labs in 3 months. We discussed many lifestyle modifications today in depth, and [name] West will continue to work on decreasing saturated fats such as fatty red meat, butter and many fried foods. He will also increase vegetables and lean protein in his diet and continue to work on exercise and weight loss efforts. [name] West agrees to follow up in our office in 2 weeks.   Vitamin D Deficiency [name] West was informed that low vitamin D levels contributes to fatigue and are associated with obesity, breast, and colon cancer. He agrees to start taking prescription Vit D @50 ,000 IU every week #4 with no refills and will follow up  for routine testing of vitamin D, at least 2-3 times per year. We will recheck his levels in 3 months. He was informed of the risk of over-replacement of vitamin D and agrees to not increase his dose unless he discusses this with [place] first. [name] West agrees to follow up in our office in 2 weeks.   Pre-Diabetes [name] West will continue to work on weight loss, exercise, and decreasing simple carbohydrates in his diet to help decrease the risk of diabetes. We dicussed metformin including benefits and risks. He was informed that eating too many simple carbohydrates or too many calories at one sitting increases the likelihood of GI side effects. [name] West declined metformin for now and a prescription was not written today. [name] West agreed to follow up with [place] as directed to monitor his progress. [name] West agrees to follow up with our office in 2 weeks.   Diabetes risk counselling [name] West was given extended (30 minutes) diabetes prevention counseling today. He is 37 y.o. male and has risk factors for diabetes including obesity. We discussed intensive lifestyle modifications today with an emphasis on weight loss as well as increasing exercise and decreasing simple carbohydrates in his diet.  Anemia The diagnosis of Iron deficiency anemia was discussed with [name] West and was explained in detail. He was given suggestions of iron rich foods and and iron supplement was not prescribed. Labs will be rechecked to follow up. [name] West was advised to take the Diclofenac after a full meal. He agreed to start Protonix 40 mg qd #30 with no refills. [name] West agrees to follow up in our office in 2 weeks  Obesity [name] West is currently in the action stage of change. As such, his goal is to continue with weight loss efforts He has agreed to follow the Category 4 plan [name] West has been instructed to work up to a goal of 150 minutes of combined cardio and strengthening exercise per week for weight loss and overall health benefits. We discussed the following Behavioral  Modification Stratagies today: increasing lean protein intake and decreasing simple carbohydrates   [name] West has agreed to follow up  with our clinic in 2 weeks. He was informed of the importance of frequent follow up visits to maximize his success with intensive lifestyle modifications for his multiple health conditions.   OBESITY BEHAVIORAL INTERVENTION VISIT  Today's visit was # 2   Starting weight: 291 lbs Starting date: 10/24/2017   Today's weight : Weight: 287 lb (130.2 kg)  Today's date: 11/07/2017  Total lbs lost to date: 4    ASK: We discussed the diagnosis of obesity with [name] West A Wisener today and [name] West agreed to give [place] permission to discuss obesity behavioral modification therapy today.  ASSESS: [name] West has the diagnosis of obesity and his BMI today is 56.94 [name] West is in the action stage of change   ADVISE: [name] West was educated on the multiple health risks of obesity as well as the benefit of weight loss to improve his health. He was advised of the need for long term treatment and the importance of lifestyle modifications to improve his current health and to decrease his risk of future health problems.  AGREE: Multiple dietary modification options and treatment options were discussed and  [name] West agreed to follow the recommendations documented in the above note.  ARRANGE: [name] West was educated on the importance of frequent visits to treat obesity as outlined per CMS and USPSTF guidelines and agreed to schedule his next follow up appointment today.  I, Remi Deter, CMA, am acting as transcriptionist for Dennard Nip, MD  I have reviewed the above documentation for accuracy and completeness, and I agree with the above. -Dennard Nip, MD

## 2017-11-09 ENCOUNTER — Encounter (INDEPENDENT_AMBULATORY_CARE_PROVIDER_SITE_OTHER): Payer: Self-pay | Admitting: Family Medicine

## 2017-11-14 ENCOUNTER — Other Ambulatory Visit: Payer: Self-pay | Admitting: Family Medicine

## 2017-11-21 ENCOUNTER — Ambulatory Visit (INDEPENDENT_AMBULATORY_CARE_PROVIDER_SITE_OTHER): Payer: 59 | Admitting: Family Medicine

## 2017-11-21 ENCOUNTER — Telehealth (INDEPENDENT_AMBULATORY_CARE_PROVIDER_SITE_OTHER): Payer: Self-pay | Admitting: Family Medicine

## 2017-11-21 NOTE — Telephone Encounter (Signed)
Patient's wife called.  Stated she left a msg for a nurse last week but didn't get a call back.  The pharmacy has contacted her regarding a prescription for Protonics.  She stated that medication was not discussed at their visit so she isn't sure if [place] is to take it or not.  Please advise. Thank you.

## 2017-11-21 NOTE — Telephone Encounter (Signed)
Spoke with the patient who now does recall the discussion around the Protonix being started. Informed the patient if he wanted to wait until his next appt before taking it he can. Patient states he will inform his wife of the conversation he and Dr Leafy Ro had regarding this medication.   April, [place]

## 2017-11-22 ENCOUNTER — Ambulatory Visit (INDEPENDENT_AMBULATORY_CARE_PROVIDER_SITE_OTHER): Payer: 59 | Admitting: Family Medicine

## 2017-11-22 ENCOUNTER — Encounter (INDEPENDENT_AMBULATORY_CARE_PROVIDER_SITE_OTHER): Payer: Self-pay | Admitting: Family Medicine

## 2017-11-22 VITALS — BP 150/87 | HR 78 | Temp 97.9°F | Ht 67.0 in | Wt 282.0 lb

## 2017-11-22 DIAGNOSIS — E559 Vitamin D deficiency, unspecified: Secondary | ICD-10-CM

## 2017-11-22 DIAGNOSIS — D508 Other iron deficiency anemias: Secondary | ICD-10-CM | POA: Diagnosis not present

## 2017-11-22 DIAGNOSIS — Z6841 Body Mass Index (BMI) 40.0 and over, adult: Secondary | ICD-10-CM

## 2017-11-22 DIAGNOSIS — Z9189 Other specified personal risk factors, not elsewhere classified: Secondary | ICD-10-CM | POA: Diagnosis not present

## 2017-11-22 DIAGNOSIS — R7303 Prediabetes: Secondary | ICD-10-CM | POA: Diagnosis not present

## 2017-11-22 MED ORDER — PANTOPRAZOLE SODIUM 40 MG PO TBEC: 40.0000 mg | DELAYED_RELEASE_TABLET | Freq: Every day | ORAL | 0 refills | Status: DC

## 2017-11-24 ENCOUNTER — Encounter (INDEPENDENT_AMBULATORY_CARE_PROVIDER_SITE_OTHER): Payer: Self-pay | Admitting: Family Medicine

## 2017-11-24 NOTE — Progress Notes (Signed)
Office: [phone]  /  Fax: [phone]   HPI:   Chief Complaint: OBESITY [name] West is here to discuss his progress with his obesity treatment plan. He is on the Category 4 plan and is following his eating plan approximately 85 % of the time. He states he is exercising 0 minutes 0 times per week. [name] West does not feel that he has done well on the plan. He has had recent social functions. He likes the food and he eats it all, but he gets bored.  His weight is 282 lb (127.9 kg) today and has had a weight loss of 5 pounds over a period of 3 weeks since his last visit. He has lost 9 lbs since starting treatment with [place].  Vitamin D Deficiency [name] West has a diagnosis of vitamin D deficiency. He is currently taking vit D, but is not at goal. He denies nausea, vomiting, or muscle weakness.  Iron Deficiency Anemia [name] West has a diagnosis of anemia. He had been taking diclofenac without food was found to be slightly anemic recently. [name] He was prescribed Protonix at his most recent visit but was confused about the reason for the medicine and did not begin taking it. He notes mild fatigue in the afternoon. He has a hemorrhoid and admits hematochezia occasionally. He denies chest pain, shortness of breath, or abdominal pain.  Pre-Diabetes [name] West has a diagnosis of pre-diabetes based on his elevated Hgb A1c and was informed this puts him at greater risk of developing diabetes. He is not taking metformin currently and continues to work on diet and exercise to decrease risk of diabetes. He denies hypoglycemia.  At risk for diabetes [name] West is at higher than average risk for developing diabetes due to his pre-diabetes and obesity. He currently denies polyuria or polydipsia.  ALLERGIES: No Known Allergies  MEDICATIONS: Current Outpatient Medications on File Prior to Visit  Medication Sig Dispense Refill  . diclofenac (VOLTAREN) 75 MG EC tablet Take 1 tablet (75 mg total) by mouth 2 (two) times daily. 60 tablet 5  .  hydrochlorothiazide (HYDRODIURIL) 25 MG tablet TAKE 1 TABLET ONCE DAILY FOR FLUID AND BLOOD PRESSURE. 30 tablet 4  . sertraline (ZOLOFT) 50 MG tablet Take 50 mg by mouth daily.    . Vitamin D, Ergocalciferol, (DRISDOL) 50000 units CAPS capsule Take 1 capsule (50,000 Units total) by mouth every 7 (seven) days. 4 capsule 0   No current facility-administered medications on file prior to visit.     PAST MEDICAL HISTORY: Past Medical History:  Diagnosis Date  . Anxiety   . Gout   . HTN (hypertension)   . Hx of low back pain   . IBS (irritable bowel syndrome)   . Joint pain   . Kidney problem   . Kidney stones   . Leg edema   . Sleep apnea     PAST SURGICAL HISTORY: Past Surgical History:  Procedure Laterality Date  . BACK SURGERY    . KIDNEY STONE SURGERY      SOCIAL HISTORY: Social History   Tobacco Use  . Smoking status: Never Smoker  . Smokeless tobacco: Never Used  Substance Use Topics  . Alcohol use: Yes    Comment: occas social  . Drug use: No    FAMILY HISTORY: Family History  Problem Relation Age of Onset  . Cancer Mother        Lung  . Hyperlipidemia Father   . Heart disease Father   . Kidney Stones Father   .  Heart disease Paternal Grandfather 55       died of MI    ROS: Review of Systems  Constitutional: Positive for malaise/fatigue and weight loss.  Respiratory: Negative for shortness of breath.   Cardiovascular: Negative for chest pain.  Gastrointestinal: Negative for abdominal pain, nausea and vomiting.       Positive for hematochezia.  Genitourinary:       Negative for polyuria.  Musculoskeletal:       Negative for muscle weakness.  Endo/Heme/Allergies: Negative for polydipsia.       Negative for hyperglycemia.    PHYSICAL EXAM: Blood pressure (!) 150/87, pulse 78, temperature 97.9 F (36.6 C), temperature source Oral, height 5\' 7"  (1.702 m), weight 282 lb (127.9 kg), SpO2 98 %. Body mass index is 44.17 kg/m. Physical Exam    Constitutional: He is oriented to person, place, and time. He appears well-developed and well-nourished.  Cardiovascular: Normal rate.  Pulmonary/Chest: Effort normal.  Musculoskeletal: Normal range of motion.  Neurological: He is oriented to person, place, and time.  Skin: Skin is warm and dry.  Psychiatric: He has a normal mood and affect. His behavior is normal.    RECENT LABS AND TESTS: BMET    Component Value Date/Time   NA 140 10/24/2017 1015   K 4.0 10/24/2017 1015   CL 102 10/24/2017 1015   CO2 24 10/24/2017 1015   GLUCOSE 84 10/24/2017 1015   GLUCOSE 91 11/14/2015 0738   BUN 13 10/24/2017 1015   CREATININE 0.71 (L) 10/24/2017 1015   CREATININE 1.01 11/14/2015 0738   CALCIUM 9.2 10/24/2017 1015   GFRNONAA 119 10/24/2017 1015   GFRAA 138 10/24/2017 1015   Lab Results  Component Value Date   HGBA1C 5.6 10/24/2017   Lab Results  Component Value Date   INSULIN 24.8 10/24/2017   CBC    Component Value Date/Time   WBC 11.1 (H) 10/24/2017 1015   RBC 4.74 10/24/2017 1015   HGB 12.8 (L) 10/24/2017 1015   HCT 39.7 10/24/2017 1015   PLT 281 10/24/2017 1015   MCV 84 10/24/2017 1015   MCH 27.0 10/24/2017 1015   MCHC 32.2 10/24/2017 1015   RDW 14.5 10/24/2017 1015   LYMPHSABS 2.2 10/24/2017 1015   EOSABS 0.3 10/24/2017 1015   BASOSABS 0.0 10/24/2017 1015   Iron/TIBC/Ferritin/ %Sat No results found for: IRON, TIBC, FERRITIN, IRONPCTSAT Lipid Panel     Component Value Date/Time   CHOL 163 10/24/2017 1015   TRIG 99 10/24/2017 1015   HDL 38 (L) 10/24/2017 1015   CHOLHDL 4.3 10/24/2017 1015   CHOLHDL 4.3 11/14/2015 0738   VLDL 12 11/14/2015 0738   LDLCALC 105 (H) 10/24/2017 1015   Hepatic Function Panel     Component Value Date/Time   PROT 6.6 10/24/2017 1015   ALBUMIN 3.9 10/24/2017 1015   AST 17 10/24/2017 1015   ALT 31 10/24/2017 1015   ALKPHOS 99 10/24/2017 1015   BILITOT 0.3 10/24/2017 1015   BILIDIR 0.1 11/14/2015 0738   BILIDIR 0.16 09/14/2014  0958   IBILI 0.5 11/14/2015 0738      Component Value Date/Time   TSH 1.790 10/24/2017 1015   TSH 2.180 09/14/2014 0958   Results for Tarnowski, [name] West A (MRN [id number]) as of 11/24/2017 12:07  Ref. Range 10/24/2017 10:15  Vitamin D, 25-Hydroxy Latest Ref Range: 30.0 - 100.0 ng/mL 18.0 (L)   ASSESSMENT AND PLAN: Vitamin D deficiency  Other iron deficiency anemia  Prediabetes  At risk for diabetes mellitus  Class 3 severe obesity with serious comorbidity and body mass index (BMI) of 40.0 to 44.9 in adult, unspecified obesity type ([place])  PLAN:  Vitamin D Deficiency [name] West was informed that low vitamin D levels contributes to fatigue and are associated with obesity, breast, and colon cancer. He agrees to continue to take prescription Vit D @50 ,000 IU every week and will follow up for routine testing of vitamin D, at least 2-3 times per year. He was informed of the risk of over-replacement of vitamin D and agrees to not increase his dose unless he discusses this with [place] first. We will recheck his vitamin D level in 2 months and he agrees to follow up in 2 weeks.  Iron Deficiency Anemia The diagnosis of Iron deficiency anemia was discussed with [name] West. The reason for the Protonix was explained in detail. He agrees to start Protonix. A CBC will be rechecked in 2 months. [name] West agrees to follow up in 2 weeks.  Pre-Diabetes [name] West will continue to work on weight loss, exercise, and decreasing simple carbohydrates in his diet to help decrease the risk of diabetes.He was informed that eating too many simple carbohydrates or too many calories at one sitting increases the likelihood of GI side effects. [name] West agreed to continue with his diet  follow up with [place] as directed to monitor his progress.  Diabetes risk counseling [name] West was given extended (15 minutes) diabetes prevention counseling today. He is 36 y.o. male and has risk factors for diabetes including pre-diabetes and obesity. We discussed intensive  lifestyle modifications today with an emphasis on weight loss as well as increasing exercise and decreasing simple carbohydrates in his diet.  Obesity [name] West is currently in the action stage of change. As such, his goal is to continue with weight loss efforts. He has agreed to follow the Category 4 plan. We discussed the following Behavioral Modification Strategies today: planning for success.  [name] West has agreed to follow up with our clinic in 2 weeks. He was informed of the importance of frequent follow up visits to maximize his success with intensive lifestyle modifications for his multiple health conditions.   OBESITY BEHAVIORAL INTERVENTION VISIT  Today's visit was # 3   Starting weight: 291 lbs Starting date: 10/24/17 Today's weight : Weight: 282 lb (127.9 kg)  Today's date: 11/22/2017 Total lbs lost to date: 9  ASK: We discussed the diagnosis of obesity with [name] West A Nevel today and [name] West agreed to give [place] permission to discuss obesity behavioral modification therapy today.  ASSESS: [name] West has the diagnosis of obesity and his BMI today is 44.16. [name] West is in the action stage of change.   ADVISE: [name] West was educated on the multiple health risks of obesity as well as the benefit of weight loss to improve his health. He was advised of the need for long term treatment and the importance of lifestyle modifications to improve his current health and to decrease his risk of future health problems.  AGREE: Multiple dietary modification options and treatment options were discussed and [name] West agreed to follow the recommendations documented in the above note.  ARRANGE: [name] West was educated on the importance of frequent visits to treat obesity as outlined per CMS and USPSTF guidelines and agreed to schedule his next follow up appointment today.  Lenward Chancellor, am acting as Location manager for Georgianne Fick, FNP.  I have reviewed the above documentation for accuracy and completeness, and I agree  with the above.  - Dawn Whitmire, FNP-C.

## 2017-11-28 DIAGNOSIS — G4733 Obstructive sleep apnea (adult) (pediatric): Secondary | ICD-10-CM | POA: Diagnosis not present

## 2017-12-06 ENCOUNTER — Ambulatory Visit (INDEPENDENT_AMBULATORY_CARE_PROVIDER_SITE_OTHER): Payer: 59 | Admitting: Family Medicine

## 2017-12-12 ENCOUNTER — Ambulatory Visit (INDEPENDENT_AMBULATORY_CARE_PROVIDER_SITE_OTHER): Payer: 59 | Admitting: Family Medicine

## 2017-12-12 VITALS — BP 128/80 | HR 60 | Temp 98.5°F | Ht 67.0 in | Wt 277.0 lb

## 2017-12-12 DIAGNOSIS — E559 Vitamin D deficiency, unspecified: Secondary | ICD-10-CM | POA: Diagnosis not present

## 2017-12-12 DIAGNOSIS — D508 Other iron deficiency anemias: Secondary | ICD-10-CM

## 2017-12-12 DIAGNOSIS — Z9189 Other specified personal risk factors, not elsewhere classified: Secondary | ICD-10-CM

## 2017-12-12 DIAGNOSIS — Z6841 Body Mass Index (BMI) 40.0 and over, adult: Secondary | ICD-10-CM

## 2017-12-12 MED ORDER — VITAMIN D (ERGOCALCIFEROL) 1.25 MG (50000 UNIT) PO CAPS: 50000.0000 [IU] | ORAL_CAPSULE | ORAL | 0 refills | Status: DC

## 2017-12-14 NOTE — Progress Notes (Signed)
Office: [phone]  /  Fax: [phone]   HPI:   Chief Complaint: OBESITY [name] West is here to discuss his progress with his obesity treatment plan. He is following the Category 4 plan and is following his eating plan approximately 60-65 % of the time. He states he is exercising 0 minutes 0 times per week. [name] West is down 24 lbs all together. He started his weight loss journey in mid September before coming to our clinic. His weight is 277 lb (125.6 kg) today and has had a weight loss of 5 pounds over a period of 2 weeks since his last visit. He has lost 14 lbs since starting treatment with [place].  Vitamin D deficiency [name] West has a diagnosis of vitamin D deficiency. He is currently taking prescription Vit D but is not yet at goal. She denies nausea, vomiting or muscle weakness.  At risk for osteopenia and osteoporosis [name] West is at higher risk of osteopenia and osteoporosis due to vitamin D deficiency.   Iron Deficiency Anemia [name] West has a diagnosis of iron deficiency anemia.  He was recently found to be anemic and was prescribed Protonix because he had been taking NSAIDS on an empty stomach.  He recently discontinued both the NSAIDS and the Protonix.   ALLERGIES: No Known Allergies  MEDICATIONS: Current Outpatient Medications on File Prior to Visit  Medication Sig Dispense Refill  . diclofenac (VOLTAREN) 75 MG EC tablet Take 1 tablet (75 mg total) by mouth 2 (two) times daily. 60 tablet 5  . hydrochlorothiazide (HYDRODIURIL) 25 MG tablet TAKE 1 TABLET ONCE DAILY FOR FLUID AND BLOOD PRESSURE. 30 tablet 4  . pantoprazole (PROTONIX) 40 MG tablet Take 1 tablet (40 mg total) by mouth daily. 30 tablet 0  . sertraline (ZOLOFT) 50 MG tablet Take 50 mg by mouth daily.     No current facility-administered medications on file prior to visit.     PAST MEDICAL HISTORY: Past Medical History:  Diagnosis Date  . Anxiety   . Gout   . HTN (hypertension)   . Hx of low back pain   . IBS (irritable bowel  syndrome)   . Joint pain   . Kidney problem   . Kidney stones   . Leg edema   . Sleep apnea     PAST SURGICAL HISTORY: Past Surgical History:  Procedure Laterality Date  . BACK SURGERY    . KIDNEY STONE SURGERY      SOCIAL HISTORY: Social History   Tobacco Use  . Smoking status: Never Smoker  . Smokeless tobacco: Never Used  Substance Use Topics  . Alcohol use: Yes    Comment: occas social  . Drug use: No    FAMILY HISTORY: Family History  Problem Relation Age of Onset  . Cancer Mother        Lung  . Hyperlipidemia Father   . Heart disease Father   . Kidney Stones Father   . Heart disease Paternal Grandfather 42       died of MI    ROS: Review of Systems  Constitutional: Positive for malaise/fatigue and weight loss.  Gastrointestinal: Negative for nausea and vomiting.  Musculoskeletal:       Negative for muscle weakness    PHYSICAL EXAM: Blood pressure 128/80, pulse 60, temperature 98.5 F (36.9 C), temperature source Oral, height 5\' 7"  (1.702 m), weight 277 lb (125.6 kg), SpO2 97 %. Body mass index is 43.38 kg/m. Physical Exam  Constitutional: He is oriented to person, place, and time.  He appears well-developed and well-nourished.  Cardiovascular: Normal rate.  Pulmonary/Chest: Effort normal.  Musculoskeletal: Normal range of motion.  Neurological: He is alert and oriented to person, place, and time.  Skin: Skin is warm and dry.  Psychiatric: He has a normal mood and affect. His behavior is normal.  Vitals reviewed.   RECENT LABS AND TESTS: BMET    Component Value Date/Time   NA 140 10/24/2017 1015   K 4.0 10/24/2017 1015   CL 102 10/24/2017 1015   CO2 24 10/24/2017 1015   GLUCOSE 84 10/24/2017 1015   GLUCOSE 91 11/14/2015 0738   BUN 13 10/24/2017 1015   CREATININE 0.71 (L) 10/24/2017 1015   CREATININE 1.01 11/14/2015 0738   CALCIUM 9.2 10/24/2017 1015   GFRNONAA 119 10/24/2017 1015   GFRAA 138 10/24/2017 1015   Lab Results  Component  Value Date   HGBA1C 5.6 10/24/2017   Lab Results  Component Value Date   INSULIN 24.8 10/24/2017   CBC    Component Value Date/Time   WBC 11.1 (H) 10/24/2017 1015   RBC 4.74 10/24/2017 1015   HGB 12.8 (L) 10/24/2017 1015   HCT 39.7 10/24/2017 1015   PLT 281 10/24/2017 1015   MCV 84 10/24/2017 1015   MCH 27.0 10/24/2017 1015   MCHC 32.2 10/24/2017 1015   RDW 14.5 10/24/2017 1015   LYMPHSABS 2.2 10/24/2017 1015   EOSABS 0.3 10/24/2017 1015   BASOSABS 0.0 10/24/2017 1015   Iron/TIBC/Ferritin/ %Sat No results found for: IRON, TIBC, FERRITIN, IRONPCTSAT Lipid Panel     Component Value Date/Time   CHOL 163 10/24/2017 1015   TRIG 99 10/24/2017 1015   HDL 38 (L) 10/24/2017 1015   CHOLHDL 4.3 10/24/2017 1015   CHOLHDL 4.3 11/14/2015 0738   VLDL 12 11/14/2015 0738   LDLCALC 105 (H) 10/24/2017 1015   Hepatic Function Panel     Component Value Date/Time   PROT 6.6 10/24/2017 1015   ALBUMIN 3.9 10/24/2017 1015   AST 17 10/24/2017 1015   ALT 31 10/24/2017 1015   ALKPHOS 99 10/24/2017 1015   BILITOT 0.3 10/24/2017 1015   BILIDIR 0.1 11/14/2015 0738   BILIDIR 0.16 09/14/2014 0958   IBILI 0.5 11/14/2015 0738      Component Value Date/Time   TSH 1.790 10/24/2017 1015   TSH 2.180 09/14/2014 0958    ASSESSMENT AND PLAN: Vitamin D deficiency - Plan: Vitamin D, Ergocalciferol, (DRISDOL) 1.25 MG (50000 UT) CAPS capsule  Other iron deficiency anemia  At risk for osteoporosis  Class 3 severe obesity with serious comorbidity and body mass index (BMI) of 40.0 to 44.9 in adult, unspecified obesity type ([place])  PLAN: Vitamin D Deficiency [name] West was informed that low vitamin D levels contributes to fatigue and are associated with obesity, breast, and colon cancer. He agrees to continue to take prescription Vit D @50 ,000 IU every week #4 with no refills. He will follow up for routine testing of vitamin D, at least 2-3 times per year. He was informed of the risk of over-replacement of  vitamin D and agrees to not increase his dose unless he discusses this with [place] first. [name] West agrees to follow up with our office in 2 weeks.   At risk for osteopenia and osteoporosis [name] West was given extended  (15 minutes) osteoporosis prevention counseling today. [name] West is at risk for osteopenia and osteoporsis due to his vitamin D deficiency. He was encouraged to take his vitamin D and follow his higher calcium diet and increase strengthening  exercise to help strengthen his bones and decrease his risk of osteopenia and osteoporosis. [name] West agrees to follow up with our office in 2 weeks.   Iron Deficiency Anemia He was advised to resume Protonix until CBC is rechecked to ensure that any ulcer that may have been caused by NSAID use is healed.  [name] West agrees and will follow up with our office in 2 weeks.   Obesity [name] West is currently in the action stage of change. As such, his goal is to continue with weight loss efforts He has agreed to Category 4 with dinner journaling as desired 550-700 calories and 45+ g protein [name] West has been instructed to work up to a goal of 150 minutes of combined cardio and strengthening exercise per week for weight loss and overall health benefits. We discussed the following Behavioral Modification Stratagies today: work on meal planning and easy cooking plans and planning for success  [name] West has agreed to follow up with our clinic in 2 weeks. He was informed of the importance of frequent follow up visits to maximize his success with intensive lifestyle modifications for his multiple health conditions.   OBESITY BEHAVIORAL INTERVENTION VISIT  Today's visit was # 4   Starting weight: 291 lbs Starting date: 10/24/2017 Today's weight : Weight: 277 lb (125.6 kg)  Today's date: 12/12/2017 Total lbs lost to date: 14 lbs At least 15 minutes were spent on discussing the following behavioral intervention visit.   ASK: We discussed the diagnosis of obesity with [name] West today  and [name] West agreed to give [place] permission to discuss obesity behavioral modification therapy today.  ASSESS: [name] West has the diagnosis of obesity and his BMI today is 43.37 [name] West is in the action stage of change   ADVISE: [name] West was educated on the multiple health risks of obesity as well as the benefit of weight loss to improve his health. He was advised of the need for long term treatment and the importance of lifestyle modifications to improve his current health and to decrease his risk of future health problems.  AGREE: Multiple dietary modification options and treatment options were discussed and  [name] West agreed to follow the recommendations documented in the above note.  ARRANGE: [name] West was educated on the importance of frequent visits to treat obesity as outlined per CMS and USPSTF guidelines and agreed to schedule his next follow up appointment today.  I, Remi Deter, CMA, am acting as Location manager for [employer].  I have reviewed the above documentation for accuracy and completeness, and I agree with the above.  - Kentrell Guettler, FNP-C.

## 2017-12-15 ENCOUNTER — Encounter (INDEPENDENT_AMBULATORY_CARE_PROVIDER_SITE_OTHER): Payer: Self-pay | Admitting: Family Medicine

## 2017-12-27 ENCOUNTER — Ambulatory Visit (INDEPENDENT_AMBULATORY_CARE_PROVIDER_SITE_OTHER): Payer: 59 | Admitting: Family Medicine

## 2017-12-27 ENCOUNTER — Encounter (INDEPENDENT_AMBULATORY_CARE_PROVIDER_SITE_OTHER): Payer: Self-pay | Admitting: Family Medicine

## 2017-12-27 VITALS — BP 117/70 | HR 59 | Temp 98.0°F | Ht 67.0 in | Wt 281.0 lb

## 2017-12-27 DIAGNOSIS — Z9189 Other specified personal risk factors, not elsewhere classified: Secondary | ICD-10-CM | POA: Diagnosis not present

## 2017-12-27 DIAGNOSIS — R7303 Prediabetes: Secondary | ICD-10-CM

## 2017-12-27 DIAGNOSIS — Z6841 Body Mass Index (BMI) 40.0 and over, adult: Secondary | ICD-10-CM

## 2017-12-27 DIAGNOSIS — E559 Vitamin D deficiency, unspecified: Secondary | ICD-10-CM

## 2017-12-27 MED ORDER — VITAMIN D (ERGOCALCIFEROL) 1.25 MG (50000 UNIT) PO CAPS: 50000.0000 [IU] | ORAL_CAPSULE | ORAL | 0 refills | Status: DC

## 2017-12-28 DIAGNOSIS — G4733 Obstructive sleep apnea (adult) (pediatric): Secondary | ICD-10-CM | POA: Diagnosis not present

## 2017-12-28 NOTE — Progress Notes (Signed)
Office: [phone]  /  Fax: [phone]   HPI:   Chief Complaint: OBESITY [name] West is here to discuss his progress with his obesity treatment plan. He is on the keep a food journal with 550-700 calories and 45+ grams of protein at supper daily and follow the Category 4 plan and is following his eating plan approximately 60% of the time. He states he is exercising 0 minutes 0 times per week. [name] West has been indulging in fudge daily and thinks this is why he has gained weight. He also admits to cooking and meal prepping less. He is not getting all of his protein in.  His weight is 281 lb (127.5 kg) West and has gained 4 pounds since his last visit. He has lost 10 lbs since starting treatment with [place].  Vitamin D Deficiency [name] West has a diagnosis of vitamin D deficiency. He is currently taking prescription Vit D, but level is not at goal. He denies nausea, vomiting or muscle weakness.  Pre-Diabetes [name] West has a diagnosis of pre-diabetes based on his elevated Hgb A1c and was informed this puts him at greater risk of developing diabetes. He is not on metformin and he notes cravings and polyphagia. He continues to work on diet and exercise to decrease risk of diabetes. He denies nausea or hypoglycemia. Lab Results  Component Value Date   HGBA1C 5.6 10/24/2017    At risk for diabetes [name] West is at higher than average risk for developing diabetes due to his obesity and pre-diabetes. He currently denies polyuria or polydipsia.  ALLERGIES: No Known Allergies  MEDICATIONS: Current Outpatient Medications on File Prior to Visit  Medication Sig Dispense Refill  . hydrochlorothiazide (HYDRODIURIL) 25 MG tablet TAKE 1 TABLET ONCE DAILY FOR FLUID AND BLOOD PRESSURE. 30 tablet 4  . pantoprazole (PROTONIX) 40 MG tablet Take 1 tablet (40 mg total) by mouth daily. 30 tablet 0  . sertraline (ZOLOFT) 50 MG tablet Take 50 mg by mouth daily.     No current facility-administered medications on file prior to visit.      PAST MEDICAL HISTORY: Past Medical History:  Diagnosis Date  . Anxiety   . Gout   . HTN (hypertension)   . Hx of low back pain   . IBS (irritable bowel syndrome)   . Joint pain   . Kidney problem   . Kidney stones   . Leg edema   . Sleep apnea     PAST SURGICAL HISTORY: Past Surgical History:  Procedure Laterality Date  . BACK SURGERY    . KIDNEY STONE SURGERY      SOCIAL HISTORY: Social History   Tobacco Use  . Smoking status: Never Smoker  . Smokeless tobacco: Never Used  Substance Use Topics  . Alcohol use: Yes    Comment: occas social  . Drug use: No    FAMILY HISTORY: Family History  Problem Relation Age of Onset  . Cancer Mother        Lung  . Hyperlipidemia Father   . Heart disease Father   . Kidney Stones Father   . Heart disease Paternal Grandfather 29       died of MI    ROS: Review of Systems  Constitutional: Negative for weight loss.  Gastrointestinal: Negative for nausea and vomiting.  Genitourinary: Negative for frequency.  Musculoskeletal:       Negative muscle weakness  Endo/Heme/Allergies: Negative for polydipsia.       Positive polyphagia    PHYSICAL EXAM: Blood pressure 117/70,  pulse (!) 59, temperature 98 F (36.7 C), temperature source Oral, height 5\' 7"  (1.702 m), weight 281 lb (127.5 kg), SpO2 97 %. Body mass index is 44.01 kg/m. Physical Exam Vitals signs reviewed.  Constitutional:      Appearance: Normal appearance. He is obese.  Cardiovascular:     Rate and Rhythm: Normal rate.     Pulses: Normal pulses.  Pulmonary:     Effort: Pulmonary effort is normal.  Musculoskeletal: Normal range of motion.  Skin:    General: Skin is warm and dry.  Neurological:     Mental Status: He is alert and oriented to person, place, and time.  Psychiatric:        Mood and Affect: Mood normal.        Behavior: Behavior normal.     RECENT LABS AND TESTS: BMET    Component Value Date/Time   NA 140 10/24/2017 1015   K 4.0  10/24/2017 1015   CL 102 10/24/2017 1015   CO2 24 10/24/2017 1015   GLUCOSE 84 10/24/2017 1015   GLUCOSE 91 11/14/2015 0738   BUN 13 10/24/2017 1015   CREATININE 0.71 (L) 10/24/2017 1015   CREATININE 1.01 11/14/2015 0738   CALCIUM 9.2 10/24/2017 1015   GFRNONAA 119 10/24/2017 1015   GFRAA 138 10/24/2017 1015   Lab Results  Component Value Date   HGBA1C 5.6 10/24/2017   Lab Results  Component Value Date   INSULIN 24.8 10/24/2017   CBC    Component Value Date/Time   WBC 11.1 (H) 10/24/2017 1015   RBC 4.74 10/24/2017 1015   HGB 12.8 (L) 10/24/2017 1015   HCT 39.7 10/24/2017 1015   PLT 281 10/24/2017 1015   MCV 84 10/24/2017 1015   MCH 27.0 10/24/2017 1015   MCHC 32.2 10/24/2017 1015   RDW 14.5 10/24/2017 1015   LYMPHSABS 2.2 10/24/2017 1015   EOSABS 0.3 10/24/2017 1015   BASOSABS 0.0 10/24/2017 1015   Iron/TIBC/Ferritin/ %Sat No results found for: IRON, TIBC, FERRITIN, IRONPCTSAT Lipid Panel     Component Value Date/Time   CHOL 163 10/24/2017 1015   TRIG 99 10/24/2017 1015   HDL 38 (L) 10/24/2017 1015   CHOLHDL 4.3 10/24/2017 1015   CHOLHDL 4.3 11/14/2015 0738   VLDL 12 11/14/2015 0738   LDLCALC 105 (H) 10/24/2017 1015   Hepatic Function Panel     Component Value Date/Time   PROT 6.6 10/24/2017 1015   ALBUMIN 3.9 10/24/2017 1015   AST 17 10/24/2017 1015   ALT 31 10/24/2017 1015   ALKPHOS 99 10/24/2017 1015   BILITOT 0.3 10/24/2017 1015   BILIDIR 0.1 11/14/2015 0738   BILIDIR 0.16 09/14/2014 0958   IBILI 0.5 11/14/2015 0738      Component Value Date/Time   TSH 1.790 10/24/2017 1015   TSH 2.180 09/14/2014 0958  Results for Worthing, [name] West A (MRN [id number]) as of 12/28/2017 10:51  Ref. Range 10/24/2017 10:15  Vitamin D, 25-Hydroxy Latest Ref Range: 30.0 - 100.0 ng/mL 18.0 (L)    ASSESSMENT AND PLAN: Vitamin D deficiency - Plan: Vitamin D, Ergocalciferol, (DRISDOL) 1.25 MG (50000 UT) CAPS capsule  Prediabetes  At risk for diabetes mellitus  Class 3  severe obesity with serious comorbidity and body mass index (BMI) of 40.0 to 44.9 in adult, unspecified obesity type ([place])  PLAN:  Vitamin D Deficiency [name] West was informed that low vitamin D levels contributes to fatigue and are associated with obesity, breast, and colon cancer. [name] West agrees to continue taking prescription  Vit D @50 ,000 IU every week #4 and we will refill for 1 month. He will follow up for routine testing of vitamin D, at least 2-3 times per year. He was informed of the risk of over-replacement of vitamin D and agrees to not increase his dose unless he discusses this with [place] first. [name] West agrees to follow up with our clinic in 2 to 3 weeks. We will recheck vitamin D level in 1 month.  Pre-Diabetes [name] West will continue meal plan, and will continue to work on weight loss, exercise, and decreasing simple carbohydrates in his diet to help decrease the risk of diabetes.  [name] West declined metformin for now and a prescription was not written West. [name] West agrees to follow up with our clinic in 2 to 3 weeks as directed to monitor his progress. We will recheck labs in 1 month.  Diabetes risk counselling [name] West was given extended (15 minutes) diabetes prevention counseling West. He is 37 y.o. male and has risk factors for diabetes including obesity and pre-diabetes. We discussed intensive lifestyle modifications West with an emphasis on weight loss as well as increasing exercise and decreasing simple carbohydrates in his diet.  Obesity [name] West is currently in the action stage of change. As such, his goal is to continue with weight loss efforts He has agreed to keep a food journal with 550-700 calories and 45+ grams of protein at supper daily and follow the Category 4 plan [name] West has not been prescribed exercise at this time. We discussed the following Behavioral Modification Strategies West: increasing lean protein intake, decreasing simple carbohydrates, meal prepping,  keeping healthy foods in the home,  and planning for success    [name] West has agreed to follow up with our clinic in 2 to 3 weeks. He was informed of the importance of frequent follow up visits to maximize his success with intensive lifestyle modifications for his multiple health conditions.   OBESITY BEHAVIORAL INTERVENTION VISIT  West's visit was # 5  Starting weight: 291 lbs Starting date: 10/24/17 West's weight : 281 lbs  West's date: 12/27/2017 Total lbs lost to date: 10    ASK: We discussed the diagnosis of obesity with [name] West and [name] West agreed to give [place] permission to discuss obesity behavioral modification therapy West.  ASSESS: [name] West has the diagnosis of obesity and his BMI West is 45 [name] West is in the action stage of change   ADVISE: [name] West was educated on the multiple health risks of obesity as well as the benefit of weight loss to improve his health. He was advised of the need for long term treatment and the importance of lifestyle modifications to improve his current health and to decrease his risk of future health problems.  AGREE: Multiple dietary modification options and treatment options were discussed and  [name] West agreed to follow the recommendations documented in the above note.  ARRANGE: [name] West was educated on the importance of frequent visits to treat obesity as outlined per CMS and USPSTF guidelines and agreed to schedule his next follow up appointment West.  Wilhemena Durie, am acting as Location manager for [employer], FNP-C.  I have reviewed the above documentation for accuracy and completeness, and I agree with the above.  - Elizibeth Breau, FNP-C.

## 2017-12-29 ENCOUNTER — Encounter (INDEPENDENT_AMBULATORY_CARE_PROVIDER_SITE_OTHER): Payer: Self-pay | Admitting: Family Medicine

## 2017-12-29 DIAGNOSIS — E559 Vitamin D deficiency, unspecified: Secondary | ICD-10-CM | POA: Insufficient documentation

## 2017-12-29 DIAGNOSIS — Z6841 Body Mass Index (BMI) 40.0 and over, adult: Secondary | ICD-10-CM

## 2017-12-29 DIAGNOSIS — R7303 Prediabetes: Secondary | ICD-10-CM | POA: Insufficient documentation

## 2018-01-17 ENCOUNTER — Telehealth: Payer: Self-pay | Admitting: Family Medicine

## 2018-01-17 ENCOUNTER — Encounter: Payer: Self-pay | Admitting: Family Medicine

## 2018-01-17 ENCOUNTER — Encounter (INDEPENDENT_AMBULATORY_CARE_PROVIDER_SITE_OTHER): Payer: Self-pay | Admitting: Family Medicine

## 2018-01-17 ENCOUNTER — Ambulatory Visit: Payer: 59 | Admitting: Family Medicine

## 2018-01-17 ENCOUNTER — Ambulatory Visit (INDEPENDENT_AMBULATORY_CARE_PROVIDER_SITE_OTHER): Payer: 59 | Admitting: Family Medicine

## 2018-01-17 VITALS — BP 138/90 | Temp 98.1°F | Ht 69.0 in | Wt 287.0 lb

## 2018-01-17 VITALS — BP 142/81 | HR 64 | Temp 98.7°F | Ht 67.0 in | Wt 281.0 lb

## 2018-01-17 DIAGNOSIS — Z9189 Other specified personal risk factors, not elsewhere classified: Secondary | ICD-10-CM | POA: Diagnosis not present

## 2018-01-17 DIAGNOSIS — E559 Vitamin D deficiency, unspecified: Secondary | ICD-10-CM

## 2018-01-17 DIAGNOSIS — R7303 Prediabetes: Secondary | ICD-10-CM | POA: Diagnosis not present

## 2018-01-17 DIAGNOSIS — M21371 Foot drop, right foot: Secondary | ICD-10-CM

## 2018-01-17 DIAGNOSIS — M5431 Sciatica, right side: Secondary | ICD-10-CM

## 2018-01-17 DIAGNOSIS — Z6841 Body Mass Index (BMI) 40.0 and over, adult: Secondary | ICD-10-CM

## 2018-01-17 DIAGNOSIS — Z79899 Other long term (current) drug therapy: Secondary | ICD-10-CM

## 2018-01-17 MED ORDER — VITAMIN D (ERGOCALCIFEROL) 1.25 MG (50000 UNIT) PO CAPS: 50000.0000 [IU] | ORAL_CAPSULE | ORAL | 0 refills | Status: DC

## 2018-01-17 NOTE — Telephone Encounter (Signed)
Please change order for lumbar MRI  Needs to be Lumbar with and without contrast (due to previous surgery)

## 2018-01-17 NOTE — Telephone Encounter (Signed)
Changed.

## 2018-01-17 NOTE — Progress Notes (Signed)
   Subjective:    Patient ID: [name] West, male    DOB: August 01, 1980, 38 y.o.   MRN: [id number]  HPI Patient is here today with complaints of right leg numbness for the last month.History of left side leg pain had surgery L4-5 discectomy March 05,2019 to correct this. He states now the right side is now feeling the same.He feels this pain started a month ago while at work and he bent down and back popped. He now states the pain/numbness starts in the buttock skips the thigh area and goes from the knee down. He states it is a "shock" like pain. He states when he walks he thinks he has right foot drop.  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  HENT: Negative for congestion and rhinorrhea.   Respiratory: Negative for cough and shortness of breath.   Cardiovascular: Negative for chest pain.  Gastrointestinal: Negative for abdominal pain, diarrhea, nausea and vomiting.  Genitourinary: Negative for dysuria and hematuria.  Musculoskeletal: Positive for back pain.  Neurological: Positive for weakness and numbness. Negative for headaches.  Psychiatric/Behavioral: Negative for behavioral problems and confusion.       Objective:   Physical Exam Vitals signs reviewed.  Cardiovascular:     Rate and Rhythm: Normal rate and regular rhythm.     Heart sounds: Normal heart sounds. No murmur.  Pulmonary:     Effort: Pulmonary effort is normal.     Breath sounds: Normal breath sounds.  Lymphadenopathy:     Cervical: No cervical adenopathy.  Neurological:     Mental Status: He is alert.  Psychiatric:        Behavior: Behavior normal.    Positive straight leg raise on the right reflexes diminished strength in the right leg diminished poor great toe extension.  Poor  right foot extension.  Mild foot drop when watched with walking       Assessment & Plan:  Patient with abnormal exam Foot drop on the right side weakness in the extensor aspect of the foot and greater toe Has sciatica on the right side Consistent with an impingement of a nerve needs urgent MRI Patient has history of previous back surgery for the left side sciatica Patient has pain medicine at home he will notify us if he needs more Patient has appointment with Dr. Vertell Limber in February but this may need to be moved up

## 2018-01-17 NOTE — Telephone Encounter (Signed)
MRI prior [name] West is requiring Peer-to-Peer due to clinical criteria not met  Please call [phone], option 3 for Peer-to-Peer review    Case# [id number]

## 2018-01-18 ENCOUNTER — Encounter (INDEPENDENT_AMBULATORY_CARE_PROVIDER_SITE_OTHER): Payer: Self-pay | Admitting: Family Medicine

## 2018-01-18 NOTE — Progress Notes (Signed)
Office: [phone]  /  Fax: [phone]   HPI:   Chief Complaint: OBESITY [name] West is here to discuss his progress with his obesity treatment plan. He is on the keep a food journal with 550-700 calories and 45+ grams of protein at supper daily and follow the Category 4 plan and is following his eating plan approximately 45% of the time. He states he is exercising 0 minutes 0 times per week. [name] West is surprised he maintained his weight over the holidays. He is back on the plan and planning to get groceries for the plan today. He doesn't always eat all of the prescribed food.  His weight is 281 lb (127.5 kg) today and has not lost weight since his last visit. He has lost 10 lbs since starting treatment with [place].  Vitamin D Deficiency [name] West has a diagnosis of vitamin D deficiency. He is currently taking prescription Vit D, but level is not at goal. Last Vit D was 18 on 10/24/17. He denies nausea, vomiting or muscle weakness.  Pre-Diabetes [name] West has a diagnosis of pre-diabetes based on his elevated Hgb A1c and was informed this puts him at greater risk of developing diabetes. He is not taking metformin currently and continues to work on diet and exercise to decrease risk of diabetes. He denies polyphagia or hypoglycemia.  At risk for diabetes [name] West is at higher than average risk for developing diabetes due to his obesity and pre-diabetes. He currently denies polyuria or polydipsia.  ALLERGIES: No Known Allergies  MEDICATIONS: Current Outpatient Medications on File Prior to Visit  Medication Sig Dispense Refill  . hydrochlorothiazide (HYDRODIURIL) 25 MG tablet TAKE 1 TABLET ONCE DAILY FOR FLUID AND BLOOD PRESSURE. 30 tablet 4  . pantoprazole (PROTONIX) 40 MG tablet Take 1 tablet (40 mg total) by mouth daily. 30 tablet 0  . sertraline (ZOLOFT) 50 MG tablet Take 50 mg by mouth daily.     No current facility-administered medications on file prior to visit.     PAST MEDICAL HISTORY: Past Medical  History:  Diagnosis Date  . Anxiety   . Gout   . HTN (hypertension)   . Hx of low back pain   . IBS (irritable bowel syndrome)   . Joint pain   . Kidney problem   . Kidney stones   . Leg edema   . Sleep apnea     PAST SURGICAL HISTORY: Past Surgical History:  Procedure Laterality Date  . BACK SURGERY    . KIDNEY STONE SURGERY      SOCIAL HISTORY: Social History   Tobacco Use  . Smoking status: Never Smoker  . Smokeless tobacco: Never Used  Substance Use Topics  . Alcohol use: Yes    Comment: occas social  . Drug use: No    FAMILY HISTORY: Family History  Problem Relation Age of Onset  . Cancer Mother        Lung  . Hyperlipidemia Father   . Heart disease Father   . Kidney Stones Father   . Heart disease Paternal Grandfather 63       died of MI    ROS: Review of Systems  Constitutional: Negative for weight loss.  Gastrointestinal: Negative for nausea and vomiting.  Genitourinary: Negative for frequency.  Musculoskeletal:       Negative muscle weakness  Endo/Heme/Allergies: Negative for polydipsia.       Negative polyphagia Negative hypoglycemia    PHYSICAL EXAM: Blood pressure (!) 142/81, pulse 64, temperature 98.7 F (37.1 C), temperature  source Oral, height 5\' 7"  (1.702 m), weight 281 lb (127.5 kg), SpO2 97 %. Body mass index is 44.01 kg/m. Physical Exam Vitals signs reviewed.  Constitutional:      Appearance: Normal appearance. He is obese.  Cardiovascular:     Rate and Rhythm: Normal rate.     Pulses: Normal pulses.  Pulmonary:     Effort: Pulmonary effort is normal.  Musculoskeletal: Normal range of motion.  Skin:    General: Skin is warm and dry.  Neurological:     Mental Status: He is alert and oriented to person, place, and time.  Psychiatric:        Mood and Affect: Mood normal.        Behavior: Behavior normal.     RECENT LABS AND TESTS: BMET    Component Value Date/Time   NA 140 10/24/2017 1015   K 4.0 10/24/2017 1015    CL 102 10/24/2017 1015   CO2 24 10/24/2017 1015   GLUCOSE 84 10/24/2017 1015   GLUCOSE 91 11/14/2015 0738   BUN 13 10/24/2017 1015   CREATININE 0.71 (L) 10/24/2017 1015   CREATININE 1.01 11/14/2015 0738   CALCIUM 9.2 10/24/2017 1015   GFRNONAA 119 10/24/2017 1015   GFRAA 138 10/24/2017 1015   Lab Results  Component Value Date   HGBA1C 5.6 10/24/2017   Lab Results  Component Value Date   INSULIN 24.8 10/24/2017   CBC    Component Value Date/Time   WBC 11.1 (H) 10/24/2017 1015   RBC 4.74 10/24/2017 1015   HGB 12.8 (L) 10/24/2017 1015   HCT 39.7 10/24/2017 1015   PLT 281 10/24/2017 1015   MCV 84 10/24/2017 1015   MCH 27.0 10/24/2017 1015   MCHC 32.2 10/24/2017 1015   RDW 14.5 10/24/2017 1015   LYMPHSABS 2.2 10/24/2017 1015   EOSABS 0.3 10/24/2017 1015   BASOSABS 0.0 10/24/2017 1015   Iron/TIBC/Ferritin/ %Sat No results found for: IRON, TIBC, FERRITIN, IRONPCTSAT Lipid Panel     Component Value Date/Time   CHOL 163 10/24/2017 1015   TRIG 99 10/24/2017 1015   HDL 38 (L) 10/24/2017 1015   CHOLHDL 4.3 10/24/2017 1015   CHOLHDL 4.3 11/14/2015 0738   VLDL 12 11/14/2015 0738   LDLCALC 105 (H) 10/24/2017 1015   Hepatic Function Panel     Component Value Date/Time   PROT 6.6 10/24/2017 1015   ALBUMIN 3.9 10/24/2017 1015   AST 17 10/24/2017 1015   ALT 31 10/24/2017 1015   ALKPHOS 99 10/24/2017 1015   BILITOT 0.3 10/24/2017 1015   BILIDIR 0.1 11/14/2015 0738   BILIDIR 0.16 09/14/2014 0958   IBILI 0.5 11/14/2015 0738      Component Value Date/Time   TSH 1.790 10/24/2017 1015   TSH 2.180 09/14/2014 0958    ASSESSMENT AND PLAN: Vitamin D deficiency - Plan: Vitamin D, Ergocalciferol, (DRISDOL) 1.25 MG (50000 UT) CAPS capsule  Prediabetes  At risk for diabetes mellitus  Class 3 severe obesity with serious comorbidity and body mass index (BMI) of 40.0 to 44.9 in adult, unspecified obesity type ([place])  PLAN:  Vitamin D Deficiency [name] West was informed that low  vitamin D levels contributes to fatigue and are associated with obesity, breast, and colon cancer. [name] West agrees to continue taking prescription Vit D @50 ,000 IU every week #4 and we will refill for 1 month. He will follow up for routine testing of vitamin D, at least 2-3 times per year. He was informed of the risk of over-replacement of  vitamin D and agrees to not increase his dose unless he discusses this with [place] first. We will recheck Vit D level at next visit. [name] West agrees to follow up with our clinic in 2 weeks.  Pre-Diabetes [name] West will continue to work on weight loss, exercise, and decreasing simple carbohydrates in his diet to help decrease the risk of diabetes.  We will recheck A1c at next visit. [name] West agrees to follow up with our clinic in 2 weeks as directed to monitor his progress.  Diabetes risk counselling [name] West was given extended (15 minutes) diabetes prevention counseling today. He is 38 y.o. male and has risk factors for diabetes including obesity and pre-diabetes. We discussed intensive lifestyle modifications today with an emphasis on weight loss as well as increasing exercise and decreasing simple carbohydrates in his diet.  Obesity [name] West is currently in the action stage of change. As such, his goal is to continue with weight loss efforts He has agreed to keep a food journal with 550-700 calories and 45+ grams of protein at supper daily and follow the Category 4 plan [name] West has not been prescribed exercise at this time. We discussed the following Behavioral Modification Strategies today: increasing lean protein intake, work on meal planning and easy cooking plans, planning for success, and keep a strict food journal [name] West is to eat all the food on the plan.  [name] West has agreed to follow up with our clinic in 2 weeks. He was informed of the importance of frequent follow up visits to maximize his success with intensive lifestyle modifications for his multiple health conditions.   OBESITY  BEHAVIORAL INTERVENTION VISIT  Today's visit was # 6  Starting weight: 291 lbs Starting date: 10/24/17 Today's weight : 281 lbs Today's date: 01/17/2018 Total lbs lost to date: 10    ASK: We discussed the diagnosis of obesity with [name] West A Bacci today and [name] West agreed to give [place] permission to discuss obesity behavioral modification therapy today.  ASSESS: [name] West has the diagnosis of obesity and his BMI today is 42 [name] West is in the action stage of change   ADVISE: [name] West was educated on the multiple health risks of obesity as well as the benefit of weight loss to improve his health. He was advised of the need for long term treatment and the importance of lifestyle modifications to improve his current health and to decrease his risk of future health problems.  AGREE: Multiple dietary modification options and treatment options were discussed and  [name] West agreed to follow the recommendations documented in the above note.  ARRANGE: [name] West was educated on the importance of frequent visits to treat obesity as outlined per CMS and USPSTF guidelines and agreed to schedule his next follow up appointment today.  Wilhemena Durie, am acting as Location manager for [employer], FNP-C.  I have reviewed the above documentation for accuracy and completeness, and I agree with the above.  - [employer], FNP-C.

## 2018-01-20 NOTE — Telephone Encounter (Signed)
I spoke with peer-to-peer with [place] [facility] Approval number is as follows [id number] This order is good through 03/06/2018

## 2018-01-20 NOTE — Telephone Encounter (Signed)
Please place order for BMP - This will be drawn before MRI (due to contrast)  MRI scheduled for 01/26/2018, arrive 2:30pm - LMOM to notify pt & sent MyChart message

## 2018-01-20 NOTE — Telephone Encounter (Addendum)
Blood work ordered in Epic. 

## 2018-01-25 ENCOUNTER — Ambulatory Visit (HOSPITAL_COMMUNITY)
Admission: RE | Admit: 2018-01-25 | Discharge: 2018-01-25 | Disposition: A | Discharge status: Home / Self Care | Payer: 59 | Source: Ambulatory Visit | Attending: Family Medicine | Admitting: Family Medicine

## 2018-01-25 DIAGNOSIS — M5431 Sciatica, right side: Secondary | ICD-10-CM

## 2018-01-25 DIAGNOSIS — M48061 Spinal stenosis, lumbar region without neurogenic claudication: Secondary | ICD-10-CM | POA: Diagnosis not present

## 2018-01-25 DIAGNOSIS — M21371 Foot drop, right foot: Secondary | ICD-10-CM | POA: Insufficient documentation

## 2018-01-25 MED ORDER — GADOBUTROL 1 MMOL/ML IV SOLN
10.0000 mL | Freq: Once | INTRAVENOUS | Status: AC | PRN
  Administered 2018-01-25: 10 mL via INTRAVENOUS

## 2018-01-26 ENCOUNTER — Ambulatory Visit (HOSPITAL_COMMUNITY): Payer: 59

## 2018-01-31 ENCOUNTER — Encounter (INDEPENDENT_AMBULATORY_CARE_PROVIDER_SITE_OTHER): Payer: Self-pay

## 2018-01-31 ENCOUNTER — Ambulatory Visit (INDEPENDENT_AMBULATORY_CARE_PROVIDER_SITE_OTHER): Payer: 59 | Admitting: Family Medicine

## 2018-02-06 ENCOUNTER — Ambulatory Visit (INDEPENDENT_AMBULATORY_CARE_PROVIDER_SITE_OTHER): Payer: 59 | Admitting: Family Medicine

## 2018-02-06 ENCOUNTER — Encounter (INDEPENDENT_AMBULATORY_CARE_PROVIDER_SITE_OTHER): Payer: Self-pay | Admitting: Family Medicine

## 2018-02-06 VITALS — BP 130/81 | HR 85 | Temp 97.1°F | Ht 67.0 in | Wt 279.0 lb

## 2018-02-06 DIAGNOSIS — I10 Essential (primary) hypertension: Secondary | ICD-10-CM | POA: Diagnosis not present

## 2018-02-06 DIAGNOSIS — R7303 Prediabetes: Secondary | ICD-10-CM | POA: Diagnosis not present

## 2018-02-06 DIAGNOSIS — D508 Other iron deficiency anemias: Secondary | ICD-10-CM

## 2018-02-06 DIAGNOSIS — Z6841 Body Mass Index (BMI) 40.0 and over, adult: Secondary | ICD-10-CM

## 2018-02-06 DIAGNOSIS — E7849 Other hyperlipidemia: Secondary | ICD-10-CM

## 2018-02-06 DIAGNOSIS — F419 Anxiety disorder, unspecified: Secondary | ICD-10-CM

## 2018-02-06 DIAGNOSIS — Z9189 Other specified personal risk factors, not elsewhere classified: Secondary | ICD-10-CM

## 2018-02-06 DIAGNOSIS — E559 Vitamin D deficiency, unspecified: Secondary | ICD-10-CM | POA: Diagnosis not present

## 2018-02-06 MED ORDER — HYDROCHLOROTHIAZIDE 25 MG PO TABS: ORAL_TABLET | ORAL | 0 refills | Status: DC

## 2018-02-06 MED ORDER — SERTRALINE HCL 50 MG PO TABS: 50.0000 mg | ORAL_TABLET | Freq: Every day | ORAL | 0 refills | Status: DC

## 2018-02-06 MED ORDER — VITAMIN D (ERGOCALCIFEROL) 1.25 MG (50000 UNIT) PO CAPS: 50000.0000 [IU] | ORAL_CAPSULE | ORAL | 0 refills | Status: DC

## 2018-02-06 NOTE — Progress Notes (Signed)
Office: [phone]  /  Fax: [phone]   HPI:   Chief Complaint: OBESITY [name] West is here to discuss his progress with his obesity treatment plan. He is on the keep a food journal with 550-700 calories and 45+ grams of protein at supper daily and follow the Category 4 plan and is following his eating plan approximately 30% of the time. He states he is exercising 0 minutes 0 times per week. [name] West has been low on groceries at home and eating out more recently.  His weight is 279 lb (126.6 kg) today and has had a weight loss of 2 pounds over a period of 3 weeks since his last visit. He has lost 12 lbs since starting treatment with [place].  Vitamin D Deficiency [name] West has a diagnosis of vitamin D deficiency. He is currently taking prescription Vit D, but level is not at goal. Last Vit D level was 18 on 10/24/17. He denies nausea, vomiting or muscle weakness.  Iron Deficiency Anemia [name] West has a diagnosis of anemia. He was anemic at IOV and had been taking NSAIDS on an empty stomach. He has been taking PPI for 3 months. He is no longer on NSAIDS. He denies hematochezia or melena.  Pre-Diabetes [name] West has a diagnosis of pre-diabetes based on his elevated Hgb A1c and was informed this puts him at greater risk of developing diabetes. Last A1c was 5.6 on 10/24/17. He is not taking metformin currently and continues to work on diet and exercise to decrease risk of diabetes. He denies polyphagia or hypoglycemia.  At risk for diabetes [name] West is at higher than average risk for developing diabetes due to his obesity and pre-diabetes. He currently denies polyuria or polydipsia.  Hypertension [name] West is a 38 y.o. male with hypertension. [name] West's blood pressure is well controlled on 25 mg of HCTZ.[name] He denies chest pain or shortness of breath. He is working on weight loss to help control his blood pressure with the goal of decreasing his risk of heart attack and stroke.   Hyperlipidemia [name] West has hyperlipidemia and  has been trying to improve his cholesterol levels with intensive lifestyle modification including a low saturated fat diet, exercise and weight loss. He is not on statin and LDL is not at goal. Last LDL was 105, triglycerides were 99, and HDL was 38. He denies any chest pain or shortness of breath.  Anxiety [name] West has anxiety. His symptoms are stable on sertraline.  ALLERGIES: No Known Allergies  MEDICATIONS: Current Outpatient Medications on File Prior to Visit  Medication Sig Dispense Refill  . pantoprazole (PROTONIX) 40 MG tablet Take 1 tablet (40 mg total) by mouth daily. 30 tablet 0   No current facility-administered medications on file prior to visit.     PAST MEDICAL HISTORY: Past Medical History:  Diagnosis Date  . Anxiety   . Gout   . HTN (hypertension)   . Hx of low back pain   . IBS (irritable bowel syndrome)   . Joint pain   . Kidney problem   . Kidney stones   . Leg edema   . Sleep apnea     PAST SURGICAL HISTORY: Past Surgical History:  Procedure Laterality Date  . BACK SURGERY    . KIDNEY STONE SURGERY      SOCIAL HISTORY: Social History   Tobacco Use  . Smoking status: Never Smoker  . Smokeless tobacco: Never Used  Substance Use Topics  . Alcohol use: Yes    Comment: occas social  .  Drug use: No    FAMILY HISTORY: Family History  Problem Relation Age of Onset  . Cancer Mother        Lung  . Hyperlipidemia Father   . Heart disease Father   . Kidney Stones Father   . Heart disease Paternal Grandfather 64       died of MI    ROS: Review of Systems  Constitutional: Positive for weight loss.  Respiratory: Negative for shortness of breath.   Cardiovascular: Negative for chest pain.  Gastrointestinal: Negative for melena, nausea and vomiting.       Negative hematochezia  Genitourinary: Negative for frequency.  Musculoskeletal:       Negative muscle weakness  Endo/Heme/Allergies: Negative for polydipsia.       Negative  polyphagia Negative hypoglycemia  Psychiatric/Behavioral:       + Anxiety    PHYSICAL EXAM: Blood pressure 130/81, pulse 85, temperature (!) 97.1 F (36.2 C), temperature source Oral, height 5\' 7"  (1.702 m), weight 279 lb (126.6 kg), SpO2 96 %. Body mass index is 43.7 kg/m. Physical Exam Vitals signs reviewed.  Constitutional:      Appearance: Normal appearance. He is obese.  Cardiovascular:     Rate and Rhythm: Normal rate.     Pulses: Normal pulses.  Pulmonary:     Effort: Pulmonary effort is normal.     Breath sounds: Normal breath sounds.  Musculoskeletal: Normal range of motion.  Skin:    General: Skin is warm and dry.  Neurological:     Mental Status: He is alert and oriented to person, place, and time.  Psychiatric:        Mood and Affect: Mood normal.        Behavior: Behavior normal.     RECENT LABS AND TESTS: BMET    Component Value Date/Time   NA 140 10/24/2017 1015   K 4.0 10/24/2017 1015   CL 102 10/24/2017 1015   CO2 24 10/24/2017 1015   GLUCOSE 84 10/24/2017 1015   GLUCOSE 91 11/14/2015 0738   BUN 13 10/24/2017 1015   CREATININE 0.71 (L) 10/24/2017 1015   CREATININE 1.01 11/14/2015 0738   CALCIUM 9.2 10/24/2017 1015   GFRNONAA 119 10/24/2017 1015   GFRAA 138 10/24/2017 1015   Lab Results  Component Value Date   HGBA1C 5.6 10/24/2017   Lab Results  Component Value Date   INSULIN 24.8 10/24/2017   CBC    Component Value Date/Time   WBC 11.1 (H) 10/24/2017 1015   RBC 4.74 10/24/2017 1015   HGB 12.8 (L) 10/24/2017 1015   HCT 39.7 10/24/2017 1015   PLT 281 10/24/2017 1015   MCV 84 10/24/2017 1015   MCH 27.0 10/24/2017 1015   MCHC 32.2 10/24/2017 1015   RDW 14.5 10/24/2017 1015   LYMPHSABS 2.2 10/24/2017 1015   EOSABS 0.3 10/24/2017 1015   BASOSABS 0.0 10/24/2017 1015   Iron/TIBC/Ferritin/ %Sat No results found for: IRON, TIBC, FERRITIN, IRONPCTSAT Lipid Panel     Component Value Date/Time   CHOL 163 10/24/2017 1015   TRIG 99  10/24/2017 1015   HDL 38 (L) 10/24/2017 1015   CHOLHDL 4.3 10/24/2017 1015   CHOLHDL 4.3 11/14/2015 0738   VLDL 12 11/14/2015 0738   LDLCALC 105 (H) 10/24/2017 1015   Hepatic Function Panel     Component Value Date/Time   PROT 6.6 10/24/2017 1015   ALBUMIN 3.9 10/24/2017 1015   AST 17 10/24/2017 1015   ALT 31 10/24/2017 1015  ALKPHOS 99 10/24/2017 1015   BILITOT 0.3 10/24/2017 1015   BILIDIR 0.1 11/14/2015 0738   BILIDIR 0.16 09/14/2014 0958   IBILI 0.5 11/14/2015 0738      Component Value Date/Time   TSH 1.790 10/24/2017 1015   TSH 2.180 09/14/2014 0958    ASSESSMENT AND PLAN: Vitamin D deficiency - Plan: VITAMIN D 25 Hydroxy (Vit-D Deficiency, Fractures), Vitamin D, Ergocalciferol, (DRISDOL) 1.25 MG (50000 UT) CAPS capsule  Other iron deficiency anemia - Plan: CBC With Differential, Anemia panel  Prediabetes - Plan: Comprehensive metabolic panel, Hemoglobin A1c, Insulin, random  Essential hypertension - Plan: hydrochlorothiazide (HYDRODIURIL) 25 MG tablet  Other hyperlipidemia - Plan: Lipid Panel With LDL/HDL Ratio  Anxiety - Plan: sertraline (ZOLOFT) 50 MG tablet  At risk for diabetes mellitus  Class 3 severe obesity with serious comorbidity and body mass index (BMI) of 40.0 to 44.9 in adult, unspecified obesity type (HCC)  PLAN:  Vitamin D Deficiency [name] West was informed that low vitamin D levels contributes to fatigue and are associated with obesity, breast, and colon cancer. [name] West agrees to continue taking prescription Vit D @50 ,000 IU every week #4 and we will refill for 1 month. He will follow up for routine testing of vitamin D, at least 2-3 times per year. He was informed of the risk of over-replacement of vitamin D and agrees to not increase his dose unless he discusses this with [place] first. We will check Vit D level today. [name] West agrees to follow up with our clinic in 2 weeks.  Iron Deficiency Anemia The diagnosis of Iron deficiency anemia was discussed with [name] West  and was explained in detail. He was given suggestions of iron rich foods and iron supplement was not prescribed. We will check CBC and anemia panel today. [name] West agrees to follow up with our clinic in 2 weeks.  Pre-Diabetes [name] West will continue to work on weight loss, exercise, and decreasing simple carbohydrates in his diet to help decrease the risk of diabetes.  We will check A1c, fasting glucose, and insulin today. [name] West agrees to follow up with our clinic in 2 weeks as directed to monitor his progress.  Diabetes risk counselling [name] West was given extended (15 minutes) diabetes prevention counseling today. He is 38 y.o. male and has risk factors for diabetes including obesity and pre-diabetes. We discussed intensive lifestyle modifications today with an emphasis on weight loss as well as increasing exercise and decreasing simple carbohydrates in his diet.  Hypertension We discussed sodium restriction, working on healthy weight loss, and a regular exercise program as the means to achieve improved blood pressure control. [name] West agreed with this plan and agreed to follow up as directed. We will continue to monitor his blood pressure as well as his progress with the above lifestyle modifications. [name] West agrees to continue taking hydrochlorothiazide 25 mg q daily #30 and we will refill for 1 month. He will watch for signs of hypotension as he continues his lifestyle modifications. We will check CMP today. [name] West agrees to follow up with our clinic in 2 weeks.  Hyperlipidemia [name] West was informed of the American Heart Association Guidelines emphasizing intensive lifestyle modifications as the first line treatment for hyperlipidemia. We discussed many lifestyle modifications today in depth, and [name] West will continue to work on decreasing saturated fats such as fatty red meat, butter and many fried foods. He will also increase vegetables and lean protein in his diet and continue to work on exercise and weight loss efforts. We  will check FLP today. [name] West  agrees to follow up with our clinic in 2 weeks.  Anxiety [name] West agrees to continue taking sertraline 50 mg q daily #30 and we will refill for 1 month. [name] West agrees to follow up with our clinic in 2 weeks.  Obesity [name] West is currently in the action stage of change. As such, his goal is to continue with weight loss efforts He has agreed to follow the Category 4 plan [name] West has not been prescribed exercise at this time. We discussed the following Behavioral Modification Strategies today: work on meal planning and easy cooking plans, keeping healthy foods in the home, and planning for success   [name] West has agreed to follow up with our clinic in 2 weeks. He was informed of the importance of frequent follow up visits to maximize his success with intensive lifestyle modifications for his multiple health conditions.   OBESITY BEHAVIORAL INTERVENTION VISIT  Today's visit was # 7  Starting weight: 291 lbs Starting date: 10/24/17 Today's weight : 279 lbs  Today's date: 02/06/2018 Total lbs lost to date: 12    ASK: We discussed the diagnosis of obesity with [name] West today and [name] West agreed to give [place] permission to discuss obesity behavioral modification therapy today.  ASSESS: [name] West has the diagnosis of obesity and his BMI today is 65.69 [name] West is in the action stage of change   ADVISE: [name] West was educated on the multiple health risks of obesity as well as the benefit of weight loss to improve his health. He was advised of the need for long term treatment and the importance of lifestyle modifications to improve his current health and to decrease his risk of future health problems.  AGREE: Multiple dietary modification options and treatment options were discussed and  [name] West agreed to follow the recommendations documented in the above note.  ARRANGE: [name] West was educated on the importance of frequent visits to treat obesity as outlined per CMS and USPSTF guidelines and agreed to  schedule his next follow up appointment today.  Wilhemena Durie, am acting as Location manager for [employer], FNP-C.  I have reviewed the above documentation for accuracy and completeness, and I agree with the above.  - Mikiala Fugett, FNP-C.

## 2018-02-07 ENCOUNTER — Encounter (INDEPENDENT_AMBULATORY_CARE_PROVIDER_SITE_OTHER): Payer: Self-pay | Admitting: Family Medicine

## 2018-02-07 DIAGNOSIS — I10 Essential (primary) hypertension: Secondary | ICD-10-CM | POA: Insufficient documentation

## 2018-02-07 DIAGNOSIS — E7849 Other hyperlipidemia: Secondary | ICD-10-CM | POA: Insufficient documentation

## 2018-02-07 DIAGNOSIS — D649 Anemia, unspecified: Secondary | ICD-10-CM | POA: Insufficient documentation

## 2018-02-08 LAB — CBC WITH DIFFERENTIAL
Basophils Absolute: 0.1 10*3/uL (ref 0.0–0.2)
Basos: 1 %
EOS (ABSOLUTE): 0.3 10*3/uL (ref 0.0–0.4)
Eos: 3 %
Hemoglobin: 12.8 g/dL — ABNORMAL LOW (ref 13.0–17.7)
Immature Grans (Abs): 0.1 10*3/uL (ref 0.0–0.1)
Immature Granulocytes: 1 %
Lymphocytes Absolute: 2 10*3/uL (ref 0.7–3.1)
Lymphs: 22 %
MCH: 26.5 pg — ABNORMAL LOW (ref 26.6–33.0)
MCHC: 32.5 g/dL (ref 31.5–35.7)
MCV: 82 fL (ref 79–97)
Monocytes Absolute: 0.6 10*3/uL (ref 0.1–0.9)
Monocytes: 6 %
Neutrophils Absolute: 6.4 10*3/uL (ref 1.4–7.0)
Neutrophils: 67 %
RBC: 4.83 x10E6/uL (ref 4.14–5.80)
RDW: 14.7 % (ref 11.6–15.4)
WBC: 9.3 10*3/uL (ref 3.4–10.8)

## 2018-02-08 LAB — ANEMIA PANEL
Ferritin: 51 ng/mL (ref 30–400)
Folate, Hemolysate: 462 ng/mL
Folate, RBC: 1173 ng/mL (ref >498)
Hematocrit: 39.4 % (ref 37.5–51.0)
Iron Saturation: 15 % (ref 15–55)
Iron: 47 ug/dL (ref 38–169)
Retic Ct Pct: 1.6 % (ref 0.6–2.6)
Total Iron Binding Capacity: 319 ug/dL (ref 250–450)
UIBC: 272 ug/dL (ref 111–343)
Vitamin B-12: 426 pg/mL (ref 232–1245)

## 2018-02-08 LAB — COMPREHENSIVE METABOLIC PANEL
ALT: 24 IU/L (ref 0–44)
AST: 18 IU/L (ref 0–40)
Albumin/Globulin Ratio: 1.6 (ref 1.2–2.2)
Albumin: 3.8 g/dL — ABNORMAL LOW (ref 4.0–5.0)
Alkaline Phosphatase: 93 IU/L (ref 39–117)
BUN/Creatinine Ratio: 20 (ref 9–20)
BUN: 14 mg/dL (ref 6–20)
Bilirubin Total: 0.4 mg/dL (ref 0.0–1.2)
CO2: 22 mmol/L (ref 20–29)
Calcium: 9.1 mg/dL (ref 8.7–10.2)
Chloride: 108 mmol/L — ABNORMAL HIGH (ref 96–106)
Creatinine, Ser: 0.7 mg/dL — ABNORMAL LOW (ref 0.76–1.27)
GFR calc Af Amer: 137 mL/min/{1.73_m2} (ref >59)
GFR calc non Af Amer: 119 mL/min/{1.73_m2} (ref >59)
Globulin, Total: 2.4 g/dL (ref 1.5–4.5)
Glucose: 84 mg/dL (ref 65–99)
Potassium: 4.2 mmol/L (ref 3.5–5.2)
Sodium: 146 mmol/L — ABNORMAL HIGH (ref 134–144)
Total Protein: 6.2 g/dL (ref 6.0–8.5)

## 2018-02-08 LAB — LIPID PANEL WITH LDL/HDL RATIO
Cholesterol, Total: 144 mg/dL (ref 100–199)
HDL: 40 mg/dL (ref >39)
LDL Calculated: 91 mg/dL (ref 0–99)
LDl/HDL Ratio: 2.3 ratio (ref 0.0–3.6)
Triglycerides: 64 mg/dL (ref 0–149)
VLDL Cholesterol Cal: 13 mg/dL (ref 5–40)

## 2018-02-08 LAB — VITAMIN D 25 HYDROXY (VIT D DEFICIENCY, FRACTURES): Vit D, 25-Hydroxy: 39.9 ng/mL (ref 30.0–100.0)

## 2018-02-08 LAB — HEMOGLOBIN A1C
Est. average glucose Bld gHb Est-mCnc: 108 mg/dL
Hgb A1c MFr Bld: 5.4 % (ref 4.8–5.6)

## 2018-02-08 LAB — INSULIN, RANDOM: INSULIN: 14.7 u[IU]/mL (ref 2.6–24.9)

## 2018-02-15 DIAGNOSIS — M5416 Radiculopathy, lumbar region: Secondary | ICD-10-CM | POA: Diagnosis not present

## 2018-02-15 DIAGNOSIS — M545 Low back pain: Secondary | ICD-10-CM | POA: Diagnosis not present

## 2018-02-15 DIAGNOSIS — M5126 Other intervertebral disc displacement, lumbar region: Secondary | ICD-10-CM | POA: Diagnosis not present

## 2018-02-16 ENCOUNTER — Other Ambulatory Visit: Payer: Self-pay | Admitting: Neurosurgery

## 2018-02-17 NOTE — Pre-Procedure Instructions (Signed)
[name] West  02/17/2018      [place], [place] [address] Phone: [phone] Fax: [phone]    Your procedure is scheduled on Tuesday, February 11th.  Report to [facility] Admitting at 12:50 P.M.  Call this number if you have problems the morning of surgery:  [phone]   Remember:  Do not eat or drink after midnight.    Take these medicines the morning of surgery with A SIP OF WATER  pantoprazole (PROTONIX)  sertraline (ZOLOFT)   As of today, STOP taking any Aspirin (unless otherwise instructed by your surgeon), Aleve, Naproxen, Ibuprofen, Motrin, Advil, Goody's, BC's, all herbal medications, fish oil, and all vitamins.     Do not wear jewelry.  Do not wear lotions, powders, or colognes, or deodorant.  Men may shave face and neck.  Do not bring valuables to the hospital.  [facility] is not responsible for any belongings or valuables.  Contacts, dentures or bridgework may not be worn into surgery.  Leave your suitcase in the car.  After surgery it may be brought to your room.  For patients admitted to the hospital, discharge time will be determined by your treatment team.  Patients discharged the day of surgery will not be allowed to drive home.   Special instructions:   [place]- Preparing For Surgery  Before surgery, you can play an important role. Because skin is not sterile, your skin needs to be as free of germs as possible. You can reduce the number of germs on your skin by washing with CHG (chlorahexidine gluconate) Soap before surgery.  CHG is an antiseptic cleaner which kills germs and bonds with the skin to continue killing germs even after washing.    Oral Hygiene is also important to reduce your risk of infection.  Remember - BRUSH YOUR TEETH THE MORNING OF SURGERY WITH YOUR REGULAR TOOTHPASTE  Please do not use if you have an allergy to CHG or antibacterial soaps. If  your skin becomes reddened/irritated stop using the CHG.  Do not shave (including legs and underarms) for at least 48 hours prior to first CHG shower. It is OK to shave your face.  Please follow these instructions carefully.   1. Shower the NIGHT BEFORE SURGERY and the MORNING OF SURGERY with CHG.   2. If you chose to wash your hair, wash your hair first as usual with your normal shampoo.  3. After you shampoo, rinse your hair and body thoroughly to remove the shampoo.  4. Use CHG as you would any other liquid soap. You can apply CHG directly to the skin and wash gently with a scrungie or a clean washcloth.   5. Apply the CHG Soap to your body ONLY FROM THE NECK DOWN.  Do not use on open wounds or open sores. Avoid contact with your eyes, ears, mouth and genitals (private parts). Wash Face and genitals (private parts)  with your normal soap.  6. Wash thoroughly, paying special attention to the area where your surgery will be performed.  7. Thoroughly rinse your body with warm water from the neck down.  8. DO NOT shower/wash with your normal soap after using and rinsing off the CHG Soap.  9. Pat yourself dry with a CLEAN TOWEL.  10. Wear CLEAN PAJAMAS to bed the night before surgery, wear comfortable clothes the morning of surgery  11. Place CLEAN SHEETS  on your bed the night of your first shower and DO NOT SLEEP WITH PETS.    Day of Surgery:  Do not apply any deodorants/lotions.  Please wear clean clothes to the hospital/surgery center.   Remember to brush your teeth WITH YOUR REGULAR TOOTHPASTE.   Please read over the following fact sheets that you were given.

## 2018-02-20 ENCOUNTER — Encounter (INDEPENDENT_AMBULATORY_CARE_PROVIDER_SITE_OTHER): Payer: Self-pay

## 2018-02-20 ENCOUNTER — Encounter (HOSPITAL_COMMUNITY)
Admission: RE | Admit: 2018-02-20 | Discharge: 2018-02-20 | Disposition: A | Discharge status: Home / Self Care | Payer: 59 | Source: Ambulatory Visit | Attending: Neurosurgery | Admitting: Neurosurgery

## 2018-02-20 ENCOUNTER — Other Ambulatory Visit: Payer: Self-pay

## 2018-02-20 ENCOUNTER — Ambulatory Visit (INDEPENDENT_AMBULATORY_CARE_PROVIDER_SITE_OTHER): Payer: 59 | Admitting: Family Medicine

## 2018-02-20 ENCOUNTER — Encounter (HOSPITAL_COMMUNITY): Payer: Self-pay

## 2018-02-20 DIAGNOSIS — Z01812 Encounter for preprocedural laboratory examination: Secondary | ICD-10-CM

## 2018-02-20 DIAGNOSIS — Z79899 Other long term (current) drug therapy: Secondary | ICD-10-CM | POA: Diagnosis not present

## 2018-02-20 DIAGNOSIS — M4726 Other spondylosis with radiculopathy, lumbar region: Secondary | ICD-10-CM | POA: Diagnosis not present

## 2018-02-20 DIAGNOSIS — M5116 Intervertebral disc disorders with radiculopathy, lumbar region: Secondary | ICD-10-CM | POA: Diagnosis not present

## 2018-02-20 DIAGNOSIS — F419 Anxiety disorder, unspecified: Secondary | ICD-10-CM | POA: Diagnosis not present

## 2018-02-20 DIAGNOSIS — G4730 Sleep apnea, unspecified: Secondary | ICD-10-CM | POA: Diagnosis not present

## 2018-02-20 DIAGNOSIS — M48061 Spinal stenosis, lumbar region without neurogenic claudication: Secondary | ICD-10-CM | POA: Diagnosis not present

## 2018-02-20 DIAGNOSIS — Z6841 Body Mass Index (BMI) 40.0 and over, adult: Secondary | ICD-10-CM | POA: Diagnosis not present

## 2018-02-20 DIAGNOSIS — I10 Essential (primary) hypertension: Secondary | ICD-10-CM | POA: Diagnosis not present

## 2018-02-20 LAB — BASIC METABOLIC PANEL
Anion gap: 11 (ref 5–15)
BUN: 12 mg/dL (ref 6–20)
CHLORIDE: 105 mmol/L (ref 98–111)
CO2: 24 mmol/L (ref 22–32)
Calcium: 9.1 mg/dL (ref 8.9–10.3)
Creatinine, Ser: 0.72 mg/dL (ref 0.61–1.24)
GFR calc Af Amer: >60 mL/min (ref >60)
GFR calc non Af Amer: >60 mL/min (ref >60)
Glucose, Bld: 102 mg/dL — ABNORMAL HIGH (ref 70–99)
Potassium: 4 mmol/L (ref 3.5–5.1)
Sodium: 140 mmol/L (ref 135–145)

## 2018-02-20 LAB — CBC
HCT: 42.6 % (ref 39.0–52.0)
Hemoglobin: 13.2 g/dL (ref 13.0–17.0)
MCH: 25.5 pg — ABNORMAL LOW (ref 26.0–34.0)
MCHC: 31 g/dL (ref 30.0–36.0)
MCV: 82.4 fL (ref 80.0–100.0)
Platelets: 260 10*3/uL (ref 150–400)
RBC: 5.17 MIL/uL (ref 4.22–5.81)
RDW: 15 % (ref 11.5–15.5)
WBC: 12.2 10*3/uL — ABNORMAL HIGH (ref 4.0–10.5)
nRBC: 0 % (ref 0.0–0.2)

## 2018-02-20 LAB — SURGICAL PCR SCREEN
MRSA, PCR: POSITIVE — AB
Staphylococcus aureus: POSITIVE — AB

## 2018-02-20 MED ORDER — DEXTROSE 5 % IV SOLN
3.0000 g | INTRAVENOUS | Status: AC
  Administered 2018-02-21: 3 g via INTRAVENOUS
  Filled 2018-02-20: qty 3

## 2018-02-20 NOTE — Progress Notes (Signed)
PCP - Sallee Lange, MD Cardiologist - denies  Chest x-ray - N/A EKG - 10/24/17 Stress Test - denies ECHO - denies Cardiac Cath - denies  Sleep Study - OSA +; uses CPAP nightly   Blood Thinner Instructions: N/A Aspirin Instructions:N/A  Anesthesia review: No  Patient denies shortness of breath, fever, cough and chest pain at PAT appointment   Patient verbalized understanding of instructions that were given to them at the PAT appointment. Patient was also instructed that they will need to review over the PAT instructions again at home before surgery.

## 2018-02-21 ENCOUNTER — Ambulatory Visit (HOSPITAL_COMMUNITY): Payer: 59

## 2018-02-21 ENCOUNTER — Ambulatory Visit (HOSPITAL_COMMUNITY): Payer: 59 | Admitting: Anesthesiology

## 2018-02-21 ENCOUNTER — Observation Stay (HOSPITAL_COMMUNITY)
Admission: RE | Admit: 2018-02-21 | Discharge: 2018-02-22 | Disposition: A | Discharge status: Home / Self Care | Payer: 59 | Attending: Neurosurgery | Admitting: Neurosurgery

## 2018-02-21 ENCOUNTER — Encounter (HOSPITAL_COMMUNITY): Payer: Self-pay

## 2018-02-21 ENCOUNTER — Encounter (HOSPITAL_COMMUNITY)
Admission: RE | Disposition: A | Discharge status: Home / Self Care | Payer: Self-pay | Source: Home / Self Care | Attending: Neurosurgery

## 2018-02-21 DIAGNOSIS — Z79899 Other long term (current) drug therapy: Secondary | ICD-10-CM | POA: Insufficient documentation

## 2018-02-21 DIAGNOSIS — M5116 Intervertebral disc disorders with radiculopathy, lumbar region: Secondary | ICD-10-CM | POA: Diagnosis not present

## 2018-02-21 DIAGNOSIS — I10 Essential (primary) hypertension: Secondary | ICD-10-CM | POA: Insufficient documentation

## 2018-02-21 DIAGNOSIS — M48061 Spinal stenosis, lumbar region without neurogenic claudication: Secondary | ICD-10-CM | POA: Diagnosis not present

## 2018-02-21 DIAGNOSIS — M4726 Other spondylosis with radiculopathy, lumbar region: Secondary | ICD-10-CM | POA: Insufficient documentation

## 2018-02-21 DIAGNOSIS — Z981 Arthrodesis status: Secondary | ICD-10-CM | POA: Diagnosis not present

## 2018-02-21 DIAGNOSIS — Z419 Encounter for procedure for purposes other than remedying health state, unspecified: Secondary | ICD-10-CM

## 2018-02-21 DIAGNOSIS — Z6841 Body Mass Index (BMI) 40.0 and over, adult: Secondary | ICD-10-CM | POA: Insufficient documentation

## 2018-02-21 DIAGNOSIS — G4730 Sleep apnea, unspecified: Secondary | ICD-10-CM | POA: Insufficient documentation

## 2018-02-21 DIAGNOSIS — M5126 Other intervertebral disc displacement, lumbar region: Secondary | ICD-10-CM | POA: Diagnosis present

## 2018-02-21 DIAGNOSIS — F419 Anxiety disorder, unspecified: Secondary | ICD-10-CM | POA: Insufficient documentation

## 2018-02-21 HISTORY — DX: Other intervertebral disc displacement, lumbar region: M51.26

## 2018-02-21 HISTORY — PX: LUMBAR LAMINECTOMY/DECOMPRESSION MICRODISCECTOMY: SHX5026

## 2018-02-21 SURGERY — LUMBAR LAMINECTOMY/DECOMPRESSION MICRODISCECTOMY 2 LEVELS
Anesthesia: General | Laterality: Right

## 2018-02-21 MED ORDER — FENTANYL CITRATE (PF) 250 MCG/5ML IJ SOLN
INTRAMUSCULAR | Status: AC
  Filled 2018-02-21: qty 5

## 2018-02-21 MED ORDER — MORPHINE SULFATE (PF) 2 MG/ML IV SOLN: 2.0000 mg | INTRAVENOUS | Status: DC | PRN

## 2018-02-21 MED ORDER — DOCUSATE SODIUM 100 MG PO CAPS
100.0000 mg | ORAL_CAPSULE | Freq: Two times a day (BID) | ORAL | Status: DC
  Administered 2018-02-21: 100 mg via ORAL
  Filled 2018-02-21: qty 1

## 2018-02-21 MED ORDER — ACETAMINOPHEN 650 MG RE SUPP: 650.0000 mg | RECTAL | Status: DC | PRN

## 2018-02-21 MED ORDER — THROMBIN 5000 UNITS EX SOLR
CUTANEOUS | Status: AC
  Filled 2018-02-21: qty 10000

## 2018-02-21 MED ORDER — VITAMIN D (ERGOCALCIFEROL) 1.25 MG (50000 UNIT) PO CAPS: 50000.0000 [IU] | ORAL_CAPSULE | ORAL | Status: DC

## 2018-02-21 MED ORDER — ONDANSETRON HCL 4 MG/2ML IJ SOLN
INTRAMUSCULAR | Status: AC
  Filled 2018-02-21: qty 2

## 2018-02-21 MED ORDER — CHLORHEXIDINE GLUCONATE CLOTH 2 % EX PADS
6.0000 | MEDICATED_PAD | Freq: Every day | CUTANEOUS | Status: DC
  Administered 2018-02-22: 6 via TOPICAL

## 2018-02-21 MED ORDER — PANTOPRAZOLE SODIUM 40 MG IV SOLR: 40.0000 mg | Freq: Every day | INTRAVENOUS | Status: DC

## 2018-02-21 MED ORDER — POLYETHYLENE GLYCOL 3350 17 G PO PACK: 17.0000 g | PACK | Freq: Every day | ORAL | Status: DC | PRN

## 2018-02-21 MED ORDER — SODIUM CHLORIDE 0.9 % IV SOLN: 250.0000 mL | INTRAVENOUS | Status: DC

## 2018-02-21 MED ORDER — ONDANSETRON HCL 4 MG/2ML IJ SOLN
4.0000 mg | Freq: Four times a day (QID) | INTRAMUSCULAR | Status: DC | PRN
  Administered 2018-02-21: 4 mg via INTRAVENOUS
  Filled 2018-02-21: qty 2

## 2018-02-21 MED ORDER — LIDOCAINE-EPINEPHRINE 1 %-1:100000 IJ SOLN
INTRAMUSCULAR | Status: AC
  Filled 2018-02-21: qty 1

## 2018-02-21 MED ORDER — FENTANYL CITRATE (PF) 100 MCG/2ML IJ SOLN: 25.0000 ug | INTRAMUSCULAR | Status: DC | PRN

## 2018-02-21 MED ORDER — SODIUM CHLORIDE 0.9% FLUSH: 3.0000 mL | Freq: Two times a day (BID) | INTRAVENOUS | Status: DC

## 2018-02-21 MED ORDER — MUPIROCIN 2 % EX OINT
TOPICAL_OINTMENT | CUTANEOUS | Status: AC
  Filled 2018-02-21: qty 22

## 2018-02-21 MED ORDER — MIDAZOLAM HCL 2 MG/2ML IJ SOLN
INTRAMUSCULAR | Status: AC
  Filled 2018-02-21: qty 2

## 2018-02-21 MED ORDER — LACTATED RINGERS IV SOLN
INTRAVENOUS | Status: DC
  Administered 2018-02-21: 14:00:00 via INTRAVENOUS

## 2018-02-21 MED ORDER — ROCURONIUM BROMIDE 10 MG/ML (PF) SYRINGE
PREFILLED_SYRINGE | INTRAVENOUS | Status: DC | PRN
  Administered 2018-02-21: 50 mg via INTRAVENOUS

## 2018-02-21 MED ORDER — METHOCARBAMOL 1000 MG/10ML IJ SOLN
500.0000 mg | Freq: Four times a day (QID) | INTRAVENOUS | Status: DC | PRN
  Filled 2018-02-21: qty 5

## 2018-02-21 MED ORDER — CHLORHEXIDINE GLUCONATE CLOTH 2 % EX PADS: 6.0000 | MEDICATED_PAD | Freq: Once | CUTANEOUS | Status: DC

## 2018-02-21 MED ORDER — FENTANYL CITRATE (PF) 100 MCG/2ML IJ SOLN
INTRAMUSCULAR | Status: DC | PRN
  Administered 2018-02-21: 100 ug via INTRAVENOUS

## 2018-02-21 MED ORDER — PHENOL 1.4 % MT LIQD: 1.0000 | OROMUCOSAL | Status: DC | PRN

## 2018-02-21 MED ORDER — THROMBIN 5000 UNITS EX SOLR
OROMUCOSAL | Status: DC | PRN
  Administered 2018-02-21: 16:00:00 via TOPICAL

## 2018-02-21 MED ORDER — CEFAZOLIN SODIUM-DEXTROSE 2-4 GM/100ML-% IV SOLN
2.0000 g | Freq: Three times a day (TID) | INTRAVENOUS | Status: AC
  Administered 2018-02-21 – 2018-02-22 (×2): 2 g via INTRAVENOUS
  Filled 2018-02-21 (×2): qty 100

## 2018-02-21 MED ORDER — ALUM & MAG HYDROXIDE-SIMETH 200-200-20 MG/5ML PO SUSP: 30.0000 mL | Freq: Four times a day (QID) | ORAL | Status: DC | PRN

## 2018-02-21 MED ORDER — ACETAMINOPHEN 325 MG PO TABS: 650.0000 mg | ORAL_TABLET | ORAL | Status: DC | PRN

## 2018-02-21 MED ORDER — 0.9 % SODIUM CHLORIDE (POUR BTL) OPTIME
TOPICAL | Status: DC | PRN
  Administered 2018-02-21: 1000 mL

## 2018-02-21 MED ORDER — BISACODYL 10 MG RE SUPP: 10.0000 mg | Freq: Every day | RECTAL | Status: DC | PRN

## 2018-02-21 MED ORDER — ONDANSETRON HCL 4 MG/2ML IJ SOLN
INTRAMUSCULAR | Status: DC | PRN
  Administered 2018-02-21: 8 mg via INTRAVENOUS

## 2018-02-21 MED ORDER — GLYCOPYRROLATE PF 0.2 MG/ML IJ SOSY
PREFILLED_SYRINGE | INTRAMUSCULAR | Status: AC
  Filled 2018-02-21: qty 1

## 2018-02-21 MED ORDER — BUPIVACAINE HCL (PF) 0.5 % IJ SOLN
INTRAMUSCULAR | Status: DC | PRN
  Administered 2018-02-21: 5 mL

## 2018-02-21 MED ORDER — SERTRALINE HCL 50 MG PO TABS
50.0000 mg | ORAL_TABLET | Freq: Every day | ORAL | Status: DC
  Filled 2018-02-21: qty 1

## 2018-02-21 MED ORDER — OXYCODONE HCL 5 MG PO TABS: 5.0000 mg | ORAL_TABLET | ORAL | Status: DC | PRN

## 2018-02-21 MED ORDER — PROPOFOL 10 MG/ML IV BOLUS
INTRAVENOUS | Status: DC | PRN
  Administered 2018-02-21: 200 mg via INTRAVENOUS

## 2018-02-21 MED ORDER — ZOLPIDEM TARTRATE 5 MG PO TABS: 5.0000 mg | ORAL_TABLET | Freq: Every evening | ORAL | Status: DC | PRN

## 2018-02-21 MED ORDER — SUGAMMADEX SODIUM 200 MG/2ML IV SOLN
INTRAVENOUS | Status: DC | PRN
  Administered 2018-02-21: 300 mg via INTRAVENOUS

## 2018-02-21 MED ORDER — METHYLPREDNISOLONE ACETATE 80 MG/ML IJ SUSP
INTRAMUSCULAR | Status: DC | PRN
  Administered 2018-02-21: 80 mg

## 2018-02-21 MED ORDER — SODIUM CHLORIDE 0.9% FLUSH: 3.0000 mL | INTRAVENOUS | Status: DC | PRN

## 2018-02-21 MED ORDER — MIDAZOLAM HCL 5 MG/5ML IJ SOLN
INTRAMUSCULAR | Status: DC | PRN
  Administered 2018-02-21: 2 mg via INTRAVENOUS

## 2018-02-21 MED ORDER — KCL IN DEXTROSE-NACL 20-5-0.45 MEQ/L-%-% IV SOLN: INTRAVENOUS | Status: DC

## 2018-02-21 MED ORDER — METHYLPREDNISOLONE ACETATE 80 MG/ML IJ SUSP
INTRAMUSCULAR | Status: AC
  Filled 2018-02-21: qty 1

## 2018-02-21 MED ORDER — LIDOCAINE-EPINEPHRINE 1 %-1:100000 IJ SOLN
INTRAMUSCULAR | Status: DC | PRN
  Administered 2018-02-21: 5 mL

## 2018-02-21 MED ORDER — ONDANSETRON HCL 4 MG PO TABS: 4.0000 mg | ORAL_TABLET | Freq: Four times a day (QID) | ORAL | Status: DC | PRN

## 2018-02-21 MED ORDER — FENTANYL CITRATE (PF) 100 MCG/2ML IJ SOLN
INTRAMUSCULAR | Status: AC
  Filled 2018-02-21: qty 2

## 2018-02-21 MED ORDER — FLEET ENEMA 7-19 GM/118ML RE ENEM: 1.0000 | ENEMA | Freq: Once | RECTAL | Status: DC | PRN

## 2018-02-21 MED ORDER — SUCCINYLCHOLINE CHLORIDE 200 MG/10ML IV SOSY
PREFILLED_SYRINGE | INTRAVENOUS | Status: AC
  Filled 2018-02-21: qty 10

## 2018-02-21 MED ORDER — PANTOPRAZOLE SODIUM 40 MG PO TBEC
40.0000 mg | DELAYED_RELEASE_TABLET | Freq: Every day | ORAL | Status: DC
  Administered 2018-02-21: 40 mg via ORAL
  Filled 2018-02-21: qty 1

## 2018-02-21 MED ORDER — HYDROCODONE-ACETAMINOPHEN 5-325 MG PO TABS
2.0000 | ORAL_TABLET | ORAL | Status: DC | PRN
  Administered 2018-02-21 – 2018-02-22 (×3): 2 via ORAL
  Filled 2018-02-21 (×3): qty 2

## 2018-02-21 MED ORDER — LIDOCAINE 2% (20 MG/ML) 5 ML SYRINGE
INTRAMUSCULAR | Status: DC | PRN
  Administered 2018-02-21: 100 mg via INTRAVENOUS

## 2018-02-21 MED ORDER — MENTHOL 3 MG MT LOZG: 1.0000 | LOZENGE | OROMUCOSAL | Status: DC | PRN

## 2018-02-21 MED ORDER — MUPIROCIN 2 % EX OINT
1.0000 "application " | TOPICAL_OINTMENT | Freq: Two times a day (BID) | CUTANEOUS | Status: DC
  Administered 2018-02-21: 1 via NASAL

## 2018-02-21 MED ORDER — PROPOFOL 10 MG/ML IV BOLUS
INTRAVENOUS | Status: AC
  Filled 2018-02-21: qty 40

## 2018-02-21 MED ORDER — METHOCARBAMOL 500 MG PO TABS
500.0000 mg | ORAL_TABLET | Freq: Four times a day (QID) | ORAL | Status: DC | PRN
  Administered 2018-02-21 – 2018-02-22 (×2): 500 mg via ORAL
  Filled 2018-02-21 (×2): qty 1

## 2018-02-21 MED ORDER — SUCCINYLCHOLINE CHLORIDE 200 MG/10ML IV SOSY
PREFILLED_SYRINGE | INTRAVENOUS | Status: DC | PRN
  Administered 2018-02-21: 140 mg via INTRAVENOUS

## 2018-02-21 MED ORDER — DEXAMETHASONE SODIUM PHOSPHATE 10 MG/ML IJ SOLN
INTRAMUSCULAR | Status: DC | PRN
  Administered 2018-02-21: 10 mg via INTRAVENOUS

## 2018-02-21 MED ORDER — BUPIVACAINE HCL (PF) 0.5 % IJ SOLN
INTRAMUSCULAR | Status: AC
  Filled 2018-02-21: qty 30

## 2018-02-21 MED ORDER — FENTANYL CITRATE (PF) 250 MCG/5ML IJ SOLN
INTRAMUSCULAR | Status: DC | PRN
  Administered 2018-02-21 (×2): 100 ug via INTRAVENOUS
  Administered 2018-02-21: 50 ug via INTRAVENOUS
  Administered 2018-02-21: 100 ug via INTRAVENOUS

## 2018-02-21 MED ORDER — HYDROCHLOROTHIAZIDE 25 MG PO TABS
25.0000 mg | ORAL_TABLET | Freq: Every day | ORAL | Status: DC
  Filled 2018-02-21: qty 1

## 2018-02-21 SURGICAL SUPPLY — 54 items
BLADE CLIPPER SURG (BLADE) IMPLANT
BUR MATCHSTICK NEURO 3.0 LAGG (BURR) ×2 IMPLANT
BUR ROUND FLUTED 5 RND (BURR) ×2 IMPLANT
CANISTER SUCT 3000ML PPV (MISCELLANEOUS) ×2 IMPLANT
CARTRIDGE OIL MAESTRO DRILL (MISCELLANEOUS) ×1 IMPLANT
COVER WAND RF STERILE (DRAPES) IMPLANT
DECANTER SPIKE VIAL GLASS SM (MISCELLANEOUS) ×2 IMPLANT
DERMABOND ADVANCED (GAUZE/BANDAGES/DRESSINGS) ×1
DERMABOND ADVANCED .7 DNX12 (GAUZE/BANDAGES/DRESSINGS) ×1 IMPLANT
DIFFUSER DRILL AIR PNEUMATIC (MISCELLANEOUS) ×2 IMPLANT
DRAPE LAPAROTOMY 100X72X124 (DRAPES) ×2 IMPLANT
DRAPE MICROSCOPE LEICA (MISCELLANEOUS) ×2 IMPLANT
DRAPE SURG 17X23 STRL (DRAPES) ×2 IMPLANT
DRSG OPSITE POSTOP 4X6 (GAUZE/BANDAGES/DRESSINGS) ×2 IMPLANT
DURAPREP 26ML APPLICATOR (WOUND CARE) ×2 IMPLANT
ELECT BLADE 4.0 EZ CLEAN MEGAD (MISCELLANEOUS) ×2
ELECT REM PT RETURN 9FT ADLT (ELECTROSURGICAL) ×2
ELECTRODE BLDE 4.0 EZ CLN MEGD (MISCELLANEOUS) ×1 IMPLANT
ELECTRODE REM PT RTRN 9FT ADLT (ELECTROSURGICAL) ×1 IMPLANT
GAUZE 4X4 16PLY RFD (DISPOSABLE) IMPLANT
GAUZE SPONGE 4X4 12PLY STRL (GAUZE/BANDAGES/DRESSINGS) IMPLANT
GLOVE BIO SURGEON STRL SZ8 (GLOVE) ×2 IMPLANT
GLOVE BIOGEL PI IND STRL 8 (GLOVE) ×1 IMPLANT
GLOVE BIOGEL PI IND STRL 8.5 (GLOVE) ×1 IMPLANT
GLOVE BIOGEL PI INDICATOR 8 (GLOVE) ×1
GLOVE BIOGEL PI INDICATOR 8.5 (GLOVE) ×1
GLOVE ECLIPSE 8.0 STRL XLNG CF (GLOVE) ×2 IMPLANT
GLOVE EXAM NITRILE XL STR (GLOVE) IMPLANT
GOWN STRL REUS W/ TWL LRG LVL3 (GOWN DISPOSABLE) IMPLANT
GOWN STRL REUS W/ TWL XL LVL3 (GOWN DISPOSABLE) ×1 IMPLANT
GOWN STRL REUS W/TWL 2XL LVL3 (GOWN DISPOSABLE) ×2 IMPLANT
GOWN STRL REUS W/TWL LRG LVL3 (GOWN DISPOSABLE)
GOWN STRL REUS W/TWL XL LVL3 (GOWN DISPOSABLE) ×1
HEMOSTAT POWDER KIT SURGIFOAM (HEMOSTASIS) ×2 IMPLANT
KIT BASIN OR (CUSTOM PROCEDURE TRAY) ×2 IMPLANT
KIT TURNOVER KIT B (KITS) ×2 IMPLANT
NEEDLE HYPO 18GX1.5 BLUNT FILL (NEEDLE) ×2 IMPLANT
NEEDLE HYPO 25X1 1.5 SAFETY (NEEDLE) ×2 IMPLANT
NEEDLE SPNL 18GX3.5 QUINCKE PK (NEEDLE) ×2 IMPLANT
NS IRRIG 1000ML POUR BTL (IV SOLUTION) ×2 IMPLANT
OIL CARTRIDGE MAESTRO DRILL (MISCELLANEOUS) ×2
PACK LAMINECTOMY NEURO (CUSTOM PROCEDURE TRAY) ×2 IMPLANT
PAD ARMBOARD 7.5X6 YLW CONV (MISCELLANEOUS) ×6 IMPLANT
RUBBERBAND STERILE (MISCELLANEOUS) ×4 IMPLANT
SPONGE SURGIFOAM ABS GEL SZ50 (HEMOSTASIS) IMPLANT
STAPLER SKIN PROX WIDE 3.9 (STAPLE) IMPLANT
SUT VIC AB 0 CT1 18XCR BRD8 (SUTURE) ×1 IMPLANT
SUT VIC AB 0 CT1 8-18 (SUTURE) ×1
SUT VIC AB 2-0 CT1 18 (SUTURE) ×2 IMPLANT
SUT VIC AB 3-0 SH 8-18 (SUTURE) ×2 IMPLANT
SYR 5ML LL (SYRINGE) ×2 IMPLANT
TOWEL GREEN STERILE (TOWEL DISPOSABLE) ×2 IMPLANT
TOWEL GREEN STERILE FF (TOWEL DISPOSABLE) ×2 IMPLANT
WATER STERILE IRR 1000ML POUR (IV SOLUTION) ×2 IMPLANT

## 2018-02-21 NOTE — Brief Op Note (Signed)
02/21/2018  5:37 PM  PATIENT:  [name] West  38 y.o. male  PRE-OPERATIVE DIAGNOSIS:  Herniated nucleus pulposus, Lumbar L 34 right and L 45 right with stenosis, spondylosis, lumbago, radiculopathy  POST-OPERATIVE DIAGNOSIS:  Herniated nucleus pulposus, Lumbar L 34 right and L 45 right with stenosis, spondylosis, lumbago, radiculopathy   PROCEDURE:  Procedure(s) with comments: Right Lumbar Three-Four Lumbar Four-Five Microdiscectomy (Right) - Right Lumbar Three-Four Lumbar Four-Five Microdiscectomy  SURGEON:  Surgeon(s) and Role:    Erline Levine, MD - Primary    * Earnie Larsson, MD - Assisting  PHYSICIAN ASSISTANT:   ASSISTANTS: Poteat, RN   ANESTHESIA:   general  EBL:  150 mL   BLOOD ADMINISTERED:none  DRAINS: none   LOCAL MEDICATIONS USED:  MARCAINE    and LIDOCAINE   SPECIMEN:  No Specimen  DISPOSITION OF SPECIMEN:  N/A  COUNTS:  YES  TOURNIQUET:  * No tourniquets in log *  DICTATION: Patient has right sided spinal stenosis and herniated discs at L 34 and L4-5 with significant right leg weakness and pain. It was elected to take him to surgery for L 34 and L 45 foraminotomies and microdiscectomies.    Procedure: Patient was brought to the operating room and following the smooth and uncomplicated induction of general endotracheal anesthesia he was placed in a prone position on the Warner frame with protection assured to his transplanted kidney. Low back was prepped and draped in the usual sterile fashion with betadine scrub and DuraPrep. Area of planned incision was infiltrated with local lidocaine. Incision was made in the midline and carried to the lumbodorsal fascia which was incised on the right side of midline. Subperiosteal dissection was performed exposing what was felt to be L 34 and L 45 levels. Intraoperative x-ray demonstrated marker probes at L 34 and L 45. A laminotomy of L 34 and of L4 5 was performed with high-speed drill and completed with Kerrison rongeurs  and generous foraminotomies were performed to decompress the L 3, L 4, L 5 nerves and thecal sac. Ligamentum flavum was detached and removed in a piecemeal fashion and the right  L 3 and L 4 and L 5 nerve roots were decompressed laterally with removal of the superior aspect of the facet and ligamentum causing nerve root compression.Initially.  At the L 34 level, the L 4 nerve root was mobilized and there was felt to be a subligamentous disc herniation.  The annulus  was incised and multiple fragments of disc material were removed with a variety of pituitary rongeurs.  The interspace was irrigated and there did not appear to be any residual loose disc material.    At this point it was felt that all neural elements were well decompressed. At the L 45 level, the L 5 nerve root was mobilized and there was also  felt to be a large subligamentous disc herniation.  The annulus  was incised and multiple fragments of disc material were removed with a variety of pituitary rongeurs.  The interspace was thoroughly decompressed and then irrigated and there did not appear to be any residual loose disc material.    At this point it was felt that all neural elements were well decompressed.The wound was then irrigated with saline. Hemostasis was assured with bipolar electrocautery and the interspace was irrigated with Depo-Medrol and fentanyl. The lumbodorsal fascia was closed with 0 Vicryl sutures the subcutaneous tissues reapproximated 2-0 Vicryl inverted sutures and the skin edges were reapproximated with 3-0 Vicryl  subcuticular stitch. The wound is dressed with Dermabond and an occlusive dressing. Patient was extubated in the operating room and taken to recovery in stable and satisfactory condition having tolerated his operation well counts were correct at the end of the case.  PLAN OF CARE: Admit for overnight observation  PATIENT DISPOSITION:  PACU - hemodynamically stable.   Delay start of Pharmacological VTE agent  (>24hrs) due to surgical blood loss or risk of bleeding: yes

## 2018-02-21 NOTE — H&P (Signed)
Patient ID:   [id number] Patient: [name] West  Date of Birth: 03-Jul-1980 Visit Type: Office Visit   Date: 02/15/2018 03:45 PM Provider: Marchia Meiers. Vertell Limber MD   This 38 year old male presents for back pain.  HISTORY OF PRESENT ILLNESS: 1.  back pain  The patient comes in today complaining of significant back pain and right leg pain.  He grades his back pain at 8/10 and his right leg as quite severe as well.  He also notes that his right foot slap so when he walks.  He has had a follow-up MRI of his lumbar spine which shows a disc herniation at L4-5 on the right as well as L3-4 on the right both of which are causing nerve root compression.  His prior surgery was that left L4-5 microdiskectomy on 03/18/2017 and this appears to be well healed without evidence of recurrent disc herniation.  There does not appear to be significant instability in his lumbar spine.  On examination today the patient has right leg weakness at 4-out of 5 EHL and 4/5 right dorsiflexion.  He has a positive straight leg raise on the right at 25.  He complains of pain into his right leg and into his right great toe which is also numb.  My concern is that the patient has significant right-sided disc herniation at L4-5 but also has right-sided L4 nerve root compression due to L3-4 disc herniation.  Given his significant weakness in the severity of his pain, I have recommended that he undergo surgical intervention.  This will consist of right L3-4 and right L4-5 microdiskectomy.  The plan is to perform this on an expedited basis.  The patient was again advised to work on weight control.      Medical/Surgical/Interim History Reviewed, no change.  Last detailed document date:03/14/2017.     Family History: Reviewed, no changes.  Last detailed document date:03/14/2017.   Social History: Reviewed, no changes. Last detailed document date: 03/14/2017.    MEDICATIONS: (added, continued or stopped this  visit) Started Medication Directions Instruction Stopped 06/15/2017 diclofenac sodium 75 mg tablet,delayed release take 1 tablet by oral route 2 times every day as needed    hydrochlorothiazide 25 mg tablet take 1 tablet by oral route  every day    Zoloft 50 mg tablet take 1 tablet by oral route  every day      ALLERGIES: Ingredient Reaction Medication Name Comment NO KNOWN ALLERGIES    No known allergies.    PHYSICAL EXAM:  Vitals Date Temp F BP Pulse Ht In Wt Lb BMI BSA Pain Score 02/15/2018  144/82 80 68 278 42.27  8/10     IMPRESSION:  Disc herniation L3-4 L4-5 right with right leg pain and weakness and low back pain at 8/10 in severity.  The patient has been unable to work he notes that his right foot slap when he walks.  He has significant weakness on confrontational testing.  PLAN: Right L3-4 and right L4-5 microdiskectomy on an expedited basis.  Risks and benefits were discussed in detail with patient and patient teaching was performed.  Orders: Instruction(s)/Education: Assessment Instruction I10 Hypertension education Z68.41 Lifestyle education regarding diet  Assessment/Plan  # Detail Type Description  1. Assessment Low back pain, unspecified back pain laterality, with sciatica presence unspecified (M54.5).     2. Assessment Lumbar radiculopathy (M54.16).     3. Assessment Herniated nucleus pulposus, lumbar (M51.26).     4. Assessment Essential (primary) hypertension (I10).  5. Assessment Body mass index (BMI) 40.0-44.9, adult (Z68.41).  Plan Orders Today's instructions / counseling include(s) Lifestyle education regarding diet. Clinical information/comments: Patient encouraged to eat a well balance diet.       Pain Management Plan Pain Scale: 8/10. Method: Numeric Pain Intensity Scale. Location: back. Onset: 03/14/2009. Duration: varies. Quality: discomforting. Pain management  follow-up plan of care: Patient taking medication as prescribed.  Fall Risk Plan The patient has not fallen in the last year.              Provider:  Marchia Meiers. Vertell Limber MD  02/18/2018 03:24 PM Dictation edited by: Marchia Meiers. Vertell Limber    CC Providers: Sallee Lange [facility] [place],  [place]  [phone]   Sharilyn Sites  [facility] [address], [place] [address]-               Electronically signed by Marchia Meiers. Vertell Limber MD on 02/18/2018 03:24 PM

## 2018-02-21 NOTE — Op Note (Signed)
02/21/2018  5:37 PM  PATIENT:  [name] West  38 y.o. male  PRE-OPERATIVE DIAGNOSIS:  Herniated nucleus pulposus, Lumbar L 34 right and L 45 right with stenosis, spondylosis, lumbago, radiculopathy  POST-OPERATIVE DIAGNOSIS:  Herniated nucleus pulposus, Lumbar L 34 right and L 45 right with stenosis, spondylosis, lumbago, radiculopathy   PROCEDURE:  Procedure(s) with comments: Right Lumbar Three-Four Lumbar Four-Five Microdiscectomy (Right) - Right Lumbar Three-Four Lumbar Four-Five Microdiscectomy  SURGEON:  Surgeon(s) and Role:    Erline Levine, MD - Primary    * Earnie Larsson, MD - Assisting  PHYSICIAN ASSISTANT:   ASSISTANTS: Poteat, RN   ANESTHESIA:   general  EBL:  150 mL   BLOOD ADMINISTERED:none  DRAINS: none   LOCAL MEDICATIONS USED:  MARCAINE    and LIDOCAINE   SPECIMEN:  No Specimen  DISPOSITION OF SPECIMEN:  N/A  COUNTS:  YES  TOURNIQUET:  * No tourniquets in log *  DICTATION: Patient has right sided spinal stenosis and herniated discs at L 34 and L4-5 with significant right leg weakness and pain. It was elected to take him to surgery for L 34 and L 45 foraminotomies and microdiscectomies.    Procedure: Patient was brought to the operating room and following the smooth and uncomplicated induction of general endotracheal anesthesia he was placed in a prone position on the [place] frame with protection assured to his transplanted kidney. Low back was prepped and draped in the usual sterile fashion with betadine scrub and DuraPrep. Area of planned incision was infiltrated with local lidocaine. Incision was made in the midline and carried to the lumbodorsal fascia which was incised on the right side of midline. Subperiosteal dissection was performed exposing what was felt to be L 34 and L 45 levels. Intraoperative x-ray demonstrated marker probes at L 34 and L 45. A laminotomy of L 34 and of L4 5 was performed with high-speed drill and completed with Kerrison rongeurs  and generous foraminotomies were performed to decompress the L 3, L 4, L 5 nerves and thecal sac. Ligamentum flavum was detached and removed in a piecemeal fashion and the right  L 3 and L 4 and L 5 nerve roots were decompressed laterally with removal of the superior aspect of the facet and ligamentum causing nerve root compression.Initially.  At the L 34 level, the L 4 nerve root was mobilized and there was felt to be a subligamentous disc herniation.  The annulus  was incised and multiple fragments of disc material were removed with a variety of pituitary rongeurs.  The interspace was irrigated and there did not appear to be any residual loose disc material.    At this point it was felt that all neural elements were well decompressed. At the L 45 level, the L 5 nerve root was mobilized and there was also  felt to be a large subligamentous disc herniation.  The annulus  was incised and multiple fragments of disc material were removed with a variety of pituitary rongeurs.  The interspace was thoroughly decompressed and then irrigated and there did not appear to be any residual loose disc material.    At this point it was felt that all neural elements were well decompressed.The wound was then irrigated with saline. Hemostasis was assured with bipolar electrocautery and the interspace was irrigated with Depo-Medrol and fentanyl. The lumbodorsal fascia was closed with 0 Vicryl sutures the subcutaneous tissues reapproximated 2-0 Vicryl inverted sutures and the skin edges were reapproximated with 3-0 Vicryl  subcuticular stitch. The wound is dressed with Dermabond and an occlusive dressing. Patient was extubated in the operating room and taken to recovery in stable and satisfactory condition having tolerated his operation well counts were correct at the end of the case.  PLAN OF CARE: Admit for overnight observation  PATIENT DISPOSITION:  PACU - hemodynamically stable.   Delay start of Pharmacological VTE agent  (>24hrs) due to surgical blood loss or risk of bleeding: yes

## 2018-02-21 NOTE — Progress Notes (Signed)
Per Dr. Gennie Alma, pt request to sign a consent for blood/blood products in case he may need it during the surgical procedure. Form signed and witnessed.

## 2018-02-21 NOTE — Anesthesia Preprocedure Evaluation (Addendum)
Anesthesia Evaluation  Patient identified by MRN, date of birth, ID band Patient awake    Reviewed: Allergy & Precautions, NPO status , Patient's Chart, lab work & pertinent test results  Airway Mallampati: II  TM Distance: >3 FB     Dental   Pulmonary sleep apnea ,    breath sounds clear to auscultation       Cardiovascular hypertension,  Rhythm:Regular Rate:Normal     Neuro/Psych Anxiety    GI/Hepatic negative GI ROS, Neg liver ROS,   Endo/Other    Renal/GU Renal disease     Musculoskeletal   Abdominal   Peds  Hematology  (+) anemia ,   Anesthesia Other Findings   Reproductive/Obstetrics                            Anesthesia Physical Anesthesia Plan  ASA: III  Anesthesia Plan: General   Post-op Pain Management:    Induction: Intravenous  PONV Risk Score and Plan: 2 and Ondansetron, Dexamethasone, Midazolam and Treatment may vary due to age or medical condition  Airway Management Planned: Oral ETT  Additional Equipment:   Intra-op Plan:   Post-operative Plan: Extubation in OR  Informed Consent: I have reviewed the patients History and Physical, chart, labs and discussed the procedure including the risks, benefits and alternatives for the proposed anesthesia with the patient or authorized representative who has indicated his/her understanding and acceptance.     Dental advisory given  Plan Discussed with: CRNA and Anesthesiologist  Anesthesia Plan Comments:         Anesthesia Quick Evaluation

## 2018-02-21 NOTE — Anesthesia Postprocedure Evaluation (Signed)
Anesthesia Post Note  Patient: [name] West  Procedure(s) Performed: Right Lumbar Three-Four Lumbar Four-Five Microdiscectomy (Right )     Patient location during evaluation: PACU Anesthesia Type: General Level of consciousness: awake Pain management: pain level controlled Vital Signs Assessment: post-procedure vital signs reviewed and stable Respiratory status: spontaneous breathing Cardiovascular status: stable Postop Assessment: no apparent nausea or vomiting Anesthetic complications: no    Last Vitals:  Vitals:   02/21/18 1843 02/21/18 1931  BP: (!) 141/79 126/61  Pulse: 63 72  Resp:  20  Temp: 37.1 C 37 C  SpO2: 91% (!) 89%    Last Pain:  Vitals:   02/21/18 2016  TempSrc:   PainSc: 4                  Abhi Moccia

## 2018-02-21 NOTE — Progress Notes (Signed)
Awake, alert, conversant.  MAEW with good strength.  Denies leg pain or numbness.  Doing well.

## 2018-02-21 NOTE — Transfer of Care (Signed)
Immediate Anesthesia Transfer of Care Note  Patient: [name] West  Procedure(s) Performed: Right Lumbar Three-Four Lumbar Four-Five Microdiscectomy (Right )  Patient Location: PACU  Anesthesia Type:General  Level of Consciousness: awake, alert , oriented and patient cooperative  Airway & Oxygen Therapy: Patient Spontanous Breathing and Patient connected to face mask oxygen  Post-op Assessment: Report given to RN and Post -op Vital signs reviewed and stable  Post vital signs: Reviewed and stable  Last Vitals:  Vitals Value Taken Time  BP 175/91 02/21/2018  5:45 PM  Temp    Pulse 65 02/21/2018  5:53 PM  Resp 15 02/21/2018  5:53 PM  SpO2 90 % 02/21/2018  5:53 PM  Vitals shown include unvalidated device data.  Last Pain:  Vitals:   02/21/18 1332  TempSrc:   PainSc: 7          Complications: No apparent anesthesia complications

## 2018-02-21 NOTE — Anesthesia Procedure Notes (Signed)
Procedure Name: Intubation Date/Time: 02/21/2018 3:40 PM Performed by: Colin Benton, CRNA Pre-anesthesia Checklist: Patient identified, Suction available, Patient being monitored and Emergency Drugs available Patient Re-evaluated:Patient Re-evaluated prior to induction Oxygen Delivery Method: Circle system utilized Preoxygenation: Pre-oxygenation with 100% oxygen Induction Type: IV induction and Rapid sequence Laryngoscope Size: Miller and 2 Grade View: Grade III Tube type: Oral Tube size: 8.0 mm Number of attempts: 1 Airway Equipment and Method: Stylet Placement Confirmation: ETT inserted through vocal cords under direct vision,  positive ETCO2 and breath sounds checked- equal and bilateral Secured at: 23 cm Tube secured with: Tape Dental Injury: Teeth and Oropharynx as per pre-operative assessment

## 2018-02-22 ENCOUNTER — Encounter (HOSPITAL_COMMUNITY): Payer: Self-pay | Admitting: Neurosurgery

## 2018-02-22 DIAGNOSIS — M5116 Intervertebral disc disorders with radiculopathy, lumbar region: Secondary | ICD-10-CM | POA: Diagnosis not present

## 2018-02-22 MED ORDER — METHOCARBAMOL 500 MG PO TABS: 500.0000 mg | ORAL_TABLET | Freq: Four times a day (QID) | ORAL | 1 refills | Status: DC | PRN

## 2018-02-22 MED ORDER — OXYCODONE HCL 5 MG PO TABS: 5.0000 mg | ORAL_TABLET | ORAL | 0 refills | Status: DC | PRN

## 2018-02-22 NOTE — Discharge Summary (Signed)
Physician Discharge Summary  Patient ID: [name] West MRN: [id number] DOB/AGE: [birth year] 38 y.o.  Admit date: 02/21/2018 Discharge date: 02/22/2018  Admission Diagnoses:Herniated lumbar disc with radiculopathy, L 34, L 45 right, morbid obesity  Discharge Diagnoses: Herniated lumbar disc with radiculopathy, L 34, L 45 right, morbid obesity Active Problems:   Herniated lumbar disc without myelopathy   Discharged Condition: good  Hospital Course: Patient underwent lumbar microdiscectomy for herniated lumbar disc L 34 and L 45 right.  He did well with surgery and was discharged home on AM POD 1.  Consults: None  Significant Diagnostic Studies: None  Treatments: surgery: Patient underwent lumbar microdiscectomy for herniated lumbar disc L 34 and L 45 right  Discharge Exam: Blood pressure 139/68, pulse (!) 51, temperature 98.5 F (36.9 C), temperature source Oral, resp. rate 20, height 5\' 9"  (1.753 m), weight 126.6 kg, SpO2 94 %. Neurologic: Alert and oriented X 3, normal strength and tone. Normal symmetric reflexes. Normal coordination and gait Wound:CDI  Disposition: Home   Discharge Instructions    Diet - low sodium heart healthy   Complete by:  As directed    Increase activity slowly   Complete by:  As directed      Allergies as of 02/22/2018   No Known Allergies     Medication List    TAKE these medications   hydrochlorothiazide 25 MG tablet Commonly known as:  HYDRODIURIL TAKE 1 TABLET ONCE DAILY FOR FLUID AND BLOOD PRESSURE. What changed:    how much to take  how to take this  when to take this   methocarbamol 500 MG tablet Commonly known as:  ROBAXIN Take 1 tablet (500 mg total) by mouth every 6 (six) hours as needed for muscle spasms.   oxyCODONE 5 MG immediate release tablet Commonly known as:  Oxy IR/ROXICODONE Take 1 tablet (5 mg total) by mouth every 3 (three) hours as needed for moderate pain ((score 4 to 6)).   pantoprazole 40 MG  tablet Commonly known as:  PROTONIX Take 1 tablet (40 mg total) by mouth daily.   sertraline 50 MG tablet Commonly known as:  ZOLOFT Take 1 tablet (50 mg total) by mouth daily.   Vitamin D (Ergocalciferol) 1.25 MG (50000 UT) Caps capsule Commonly known as:  DRISDOL Take 1 capsule (50,000 Units total) by mouth every 7 (seven) days.        Signed: Peggyann Shoals, MD 02/22/2018, 8:50 AM

## 2018-02-22 NOTE — Progress Notes (Signed)
OT Evaluation  Completed education regarding back precautions and ADL tasks with compensatory techniques and AE. Wife present for session and verbalized understanding.     02/22/18 1000  OT Visit Information  Last OT Received On 02/22/18  Assistance Needed +1  History of Present Illness Pt is a 38 y/o male who presents s/p L3-L5 laminectomy/decompression on 02/21/2018. PMH significant for HTN, gout, prior back surgery.  Precautions  Precautions Back  Precaution Booklet Issued Yes (comment)  Restrictions  Weight Bearing Restrictions No  Home Living  Family/patient expects to be discharged to: Private residence  Living Arrangements Spouse/significant other  Available Help at Discharge Family  Type of [place] to enter  Entrance Stairs-Number of Steps 1  [place] One level  Engineer, manufacturing systems Yes  How Accessible Accessible via walker  [place] - built in (corner seat)  Prior Function  Level of Independence Independent  Comments In process of losing weight through [facility] healthy weight program  Communication  Communication No difficulties  Pain Assessment  Pain Assessment Faces  Faces Pain Scale 2  Pain Location Incision site  Pain Descriptors / Indicators Operative site guarding  Pain Intervention(s) Limited activity within patient's tolerance  Cognition  Arousal/Alertness Awake/alert  Behavior During Therapy WFL for tasks assessed/performed  Overall Cognitive Status Within Functional Limits for tasks assessed  Upper Extremity Assessment  Upper Extremity Assessment Overall WFL for tasks assessed  Lower Extremity Assessment  Lower Extremity Assessment Defer to PT evaluation  Cervical / Trunk Assessment  Cervical / Trunk Assessment Other exceptions  Cervical / Trunk Exceptions s/p surgery  ADL  Overall ADL's  Needs assistance/impaired  Functional mobility during  ADLs Independent  General ADL Comments Completed educaiton regarding compensatory strateiges and use of DEM and AE to complete LB ADL and hygiene after toileting. Recommend pt use a reacher fpr LB ADL at this time. Pt verbalzied understanding.   Bed Mobility  Overal bed mobility Modified Independent  Transfers  Overall transfer level Modified independent  Equipment used None  Balance  Overall balance assessment No apparent balance deficits (not formally assessed)  OT - End of Session  Activity Tolerance Patient tolerated treatment well  Patient left in bed;with call bell/phone within reach;with family/visitor present  Nurse Communication Mobility status  OT Assessment  OT Recommendation/Assessment Patient does not need any further OT services  OT Visit Diagnosis Pain  Pain - part of body  (back)  OT Problem List Decreased knowledge of use of DME or AE;Decreased knowledge of precautions;Pain;Obesity  AM-PAC OT "6 Clicks" Daily Activity Outcome Measure (Version 2)  Help from another person eating meals? 4  Help from another person taking care of personal grooming? 4  Help from another person toileting, which includes using toliet, bedpan, or urinal? 3  Help from another person bathing (including washing, rinsing, drying)? 3  Help from another person to put on and taking off regular upper body clothing? 4  Help from another person to put on and taking off regular lower body clothing? 3  6 Click Score 21  OT Recommendation  Follow Up Recommendations No OT follow up;Supervision - Intermittent  OT Equipment None recommended by OT  Acute Rehab OT Goals  Patient Stated Goal Home this morning  OT Goal Formulation All assessment and education complete, DC therapy  OT Time Calculation  OT Start Time (ACUTE ONLY) 0906  OT Stop Time (ACUTE ONLY) 0923  OT  Time Calculation (min) 17 min  OT General Charges  $OT Visit 1 Visit  OT Evaluation  $OT Eval Low Complexity [place], OT/L    Acute OT Clinical Specialist Acute Rehabilitation Services Pager [phone] Office [phone]

## 2018-02-22 NOTE — Evaluation (Signed)
Physical Therapy Evaluation and Discharge Patient Details Name: [name] West MRN: [id number] DOB: June 17, 1980 Today's Date: 02/22/2018   History of Present Illness  Pt is a 38 y/o male who presents s/p L3-L5 laminectomy/decompression on 02/21/2018. PMH significant for HTN, gout, prior back surgery.  Clinical Impression  Patient evaluated by Physical Therapy with no further acute PT needs identified. All education has been completed and the patient has no further questions. At the time of PT eval pt was able to perform transfers and ambulation with gross modified independence and no AD. Pt was educated on precautions, car transfer, activity progression and positioning recommendations. See below for any follow-up Physical Therapy or equipment needs. PT is signing off. Thank you for this referral.     Follow Up Recommendations No PT follow up;Supervision - Intermittent    Equipment Recommendations  None recommended by PT    Recommendations for Other Services       Precautions / Restrictions Precautions Precautions: Fall;Back Precaution Booklet Issued: No Precaution Comments: Reviewed precautions verbally during functional mobility.  Restrictions Weight Bearing Restrictions: No      Mobility  Bed Mobility               General bed mobility comments: Pt was received ambulating in the hall with nursing staff  Transfers Overall transfer level: Modified independent Equipment used: None Transfers: Sit to/from Stand           General transfer comment: No assist required. Pt demonstrated proper hand placement on seated surface for safety.   Ambulation/Gait Ambulation/Gait assistance: Modified independent (Device/Increase time) Gait Distance (Feet): 400 Feet Assistive device: None Gait Pattern/deviations: WFL(Within Functional Limits) Gait velocity: Decreased Gait velocity interpretation: >2.62 ft/sec, indicative of community ambulatory General Gait Details: Pt ambulating  casually with no overt LOB or gait deviations. Slightly decreased gait speed due to pain but overall WFL.   Stairs            Wheelchair Mobility    Modified Rankin (Stroke Patients Only)       Balance Overall balance assessment: No apparent balance deficits (not formally assessed)                                           Pertinent Vitals/Pain Pain Assessment: Faces Faces Pain Scale: Hurts a little bit Pain Location: Incision site Pain Descriptors / Indicators: Operative site guarding Pain Intervention(s): Monitored during session    Home Living Family/patient expects to be discharged to:: Private residence Living Arrangements: Spouse/significant other Available Help at Discharge: Family Type of Home: House Home Access: Stairs to enter   Technical brewer of Steps: 1 Home Layout: One level Home Equipment: Shower seat - built in(corner seat)      Prior Function Level of Independence: Independent               Hand Dominance        Extremity/Trunk Assessment   Upper Extremity Assessment Upper Extremity Assessment: Overall WFL for tasks assessed    Lower Extremity Assessment Lower Extremity Assessment: Overall WFL for tasks assessed(Mild weakness consistent with pre-op diagnosis)    Cervical / Trunk Assessment Cervical / Trunk Assessment: Other exceptions Cervical / Trunk Exceptions: s/p surgery  Communication   Communication: No difficulties  Cognition Arousal/Alertness: Awake/alert Behavior During Therapy: WFL for tasks assessed/performed Overall Cognitive Status: Within Functional Limits for tasks assessed  General Comments      Exercises     Assessment/Plan    PT Assessment Patent does not need any further PT services  PT Problem List         PT Treatment Interventions      PT Goals (Current goals can be found in the Care Plan section)  Acute Rehab  PT Goals Patient Stated Goal: Home this morning PT Goal Formulation: All assessment and education complete, DC therapy    Frequency     Barriers to discharge        Co-evaluation               AM-PAC PT "6 Clicks" Mobility  Outcome Measure Help needed turning from your back to your side while in a flat bed without using bedrails?: None Help needed moving from lying on your back to sitting on the side of a flat bed without using bedrails?: None Help needed moving to and from a bed to a chair (including a wheelchair)?: None Help needed standing up from a chair using your arms (e.g., wheelchair or bedside chair)?: None Help needed to walk in hospital room?: None Help needed climbing 3-5 steps with a railing? : None 6 Click Score: 24    End of Session   Activity Tolerance: Patient tolerated treatment well Patient left: with call bell/phone within reach;with family/visitor present(Sitting EOB) Nurse Communication: Mobility status PT Visit Diagnosis: Pain;Other symptoms and signs involving the nervous system (R29.898) Pain - part of body: (Back)    Time: 0820-0829 PT Time Calculation (min) (ACUTE ONLY): 9 min   Charges:   PT Evaluation $PT Eval Low Complexity: 1 Low          Rolinda Roan, PT, DPT Acute Rehabilitation Services Pager: [phone] Office: [phone]   Thelma Comp 02/22/2018, 9:28 AM

## 2018-02-22 NOTE — Discharge Instructions (Signed)

## 2018-02-22 NOTE — Progress Notes (Signed)
Patient alert and oriented, mae's well, voiding adequate amount of urine, swallowing without difficulty, no c/o pain at time of discharge. Patient discharged home with family. Script and discharged instructions given to patient. Patient and family stated understanding of instructions given. Patient has an appointment with Dr.Stern    

## 2018-02-22 NOTE — Progress Notes (Addendum)
Subjective: Patient reports "I feel good now, just sore in my back"  Objective: Vital signs in last 24 hours: Temp:  [97.6 F (36.4 C)-98.8 F (37.1 C)] 98.4 F (36.9 C) (02/12 0444) Pulse Rate:  [55-77] 77 (02/12 0444) Resp:  [10-24] 20 (02/12 0444) BP: (125-184)/(56-91) 125/56 (02/12 0444) SpO2:  [89 %-99 %] 96 % (02/12 0444) Weight:  [126.6 kg] 126.6 kg (02/11 1314)  Intake/Output from previous day: 02/11 0701 - 02/12 0700 In: 200 [P.O.:200] Out: 950 [Urine:800; Blood:150] Intake/Output this shift: No intake/output data recorded.  Awakens to voice. Conversant. MAEW with good strength BLE. Reports no leg pain. Mild persistent numbness ball of right foot, not unexpected. Reports severe headaches last night with standing to walk and standing to void. H/A decreased to resolve when flat. Flat bedrest since 9pm. Slept well. No headache since. Incison flat, without erythema, swelling, or drainage beneath honeycomb and Dermabond.  Lab Results: Recent Labs    02/20/18 1426  WBC 12.2*  HGB 13.2  HCT 42.6  PLT 260   BMET Recent Labs    02/20/18 1426  NA 140  K 4.0  CL 105  CO2 24  GLUCOSE 102*  BUN 12  CREATININE 0.72  CALCIUM 9.1    Studies/Results: Dg Lumbar Spine 2-3 Views  Result Date: 02/21/2018 CLINICAL DATA:  L3-4, L4-5 microdiscectomy EXAM: LUMBAR SPINE - 2-3 VIEW COMPARISON:  MRI 01/25/2018 FINDINGS: 1st lateral intraoperative image demonstrates posterior surgical instruments directed at the L4-5 level. Second lateral intraoperative image demonstrates posterior surgical instruments directed at the L3-4 and L4-5 levels IMPRESSION: Intraoperative localization as above. Electronically Signed   By: Rolm Baptise M.D.   On: 02/21/2018 19:37    Assessment/Plan: Improved radicular pain  LOS: 0 days  Plan to elevate HOB gradually this am to mobilize. Call if headaches return. Plan d/c to home for this afternoon if mobilizing well.   Verdis Prime 02/22/2018, 7:35  AM  No headache.  Up walking without difficulty.  OK to discharge home.

## 2018-03-01 ENCOUNTER — Telehealth: Payer: Self-pay | Admitting: Family Medicine

## 2018-03-01 NOTE — Telephone Encounter (Signed)
Please sign & date order for CPAP supplies & send to [facility] to be faxed to [facility]   In red folder in basket on wall

## 2018-03-02 NOTE — Telephone Encounter (Signed)
Faxed signed order to [facility]

## 2018-03-02 NOTE — Telephone Encounter (Signed)
Please advise. Thank you

## 2018-03-02 NOTE — Telephone Encounter (Signed)
This was signed.

## 2018-03-20 ENCOUNTER — Encounter: Payer: Self-pay | Admitting: Family Medicine

## 2018-03-21 ENCOUNTER — Other Ambulatory Visit: Payer: Self-pay

## 2018-03-21 DIAGNOSIS — E559 Vitamin D deficiency, unspecified: Secondary | ICD-10-CM

## 2018-03-21 DIAGNOSIS — F419 Anxiety disorder, unspecified: Secondary | ICD-10-CM

## 2018-03-21 MED ORDER — SERTRALINE HCL 50 MG PO TABS: 50.0000 mg | ORAL_TABLET | Freq: Every day | ORAL | 2 refills | Status: DC

## 2018-03-21 MED ORDER — VITAMIN D (ERGOCALCIFEROL) 1.25 MG (50000 UNIT) PO CAPS: 50000.0000 [IU] | ORAL_CAPSULE | ORAL | 2 refills | Status: DC

## 2018-03-21 NOTE — Telephone Encounter (Signed)
The patient may have 3 refills each on these but he will need a follow-up office visit with [place] to discuss these-I recommend follow-up office visit in April or May

## 2018-04-12 ENCOUNTER — Encounter (INDEPENDENT_AMBULATORY_CARE_PROVIDER_SITE_OTHER): Payer: Self-pay

## 2018-05-18 ENCOUNTER — Other Ambulatory Visit: Payer: Self-pay

## 2018-05-18 ENCOUNTER — Ambulatory Visit (INDEPENDENT_AMBULATORY_CARE_PROVIDER_SITE_OTHER): Payer: Self-pay | Admitting: Family Medicine

## 2018-05-18 DIAGNOSIS — E669 Obesity, unspecified: Secondary | ICD-10-CM

## 2018-05-18 DIAGNOSIS — R7303 Prediabetes: Secondary | ICD-10-CM

## 2018-05-18 DIAGNOSIS — I10 Essential (primary) hypertension: Secondary | ICD-10-CM

## 2018-05-18 DIAGNOSIS — D508 Other iron deficiency anemias: Secondary | ICD-10-CM

## 2018-05-18 DIAGNOSIS — E559 Vitamin D deficiency, unspecified: Secondary | ICD-10-CM

## 2018-05-18 DIAGNOSIS — F419 Anxiety disorder, unspecified: Secondary | ICD-10-CM

## 2018-05-18 DIAGNOSIS — E7849 Other hyperlipidemia: Secondary | ICD-10-CM

## 2018-05-18 MED ORDER — POTASSIUM CHLORIDE ER 10 MEQ PO TBCR: 10.0000 meq | EXTENDED_RELEASE_TABLET | Freq: Every day | ORAL | 5 refills | Status: DC

## 2018-05-18 MED ORDER — HYDROCHLOROTHIAZIDE 25 MG PO TABS: ORAL_TABLET | ORAL | 5 refills | Status: DC

## 2018-05-18 MED ORDER — PANTOPRAZOLE SODIUM 40 MG PO TBEC: 40.0000 mg | DELAYED_RELEASE_TABLET | Freq: Every day | ORAL | 5 refills | Status: DC

## 2018-05-18 MED ORDER — VITAMIN D (ERGOCALCIFEROL) 1.25 MG (50000 UNIT) PO CAPS: 50000.0000 [IU] | ORAL_CAPSULE | ORAL | 2 refills | Status: DC

## 2018-05-18 MED ORDER — SERTRALINE HCL 50 MG PO TABS: 50.0000 mg | ORAL_TABLET | Freq: Every day | ORAL | 5 refills | Status: DC

## 2018-05-18 NOTE — Progress Notes (Signed)
   Subjective:    Patient ID: [name] West, male    DOB: 1980/06/05, 38 y.o.   MRN: [id number]  Hypertension  This is a new problem. The current episode started more than 1 year ago. Risk factors for coronary artery disease include male gender. Treatments tried: hctz. The current treatment provides no improvement. There are no compliance problems.    Patient was going [facility] the [facility] but is no longer going now.  Stress related issues does not take medicine states it does help benefit him  He also gets some pedal edema and uses a diuretic intermittently to help with this-is not taking a potassium currently  Also has iron deficiency anemia with intermittent low ferritin levels tries eat a healthy diet to help counter this  Has prediabetes and morbid obesity patient had been going to the weight loss reduction center but now will be working on his own and following up with [place] Virtual Visit via Video Note  I connected with [name] West on 05/18/18 at  1:40 PM EDT by a video enabled telemedicine application and verified that I am speaking with the correct person using two identifiers.  Location: Patient: home Provider: office   I discussed the limitations of evaluation and management by telemedicine and the availability of in person appointments. The patient expressed understanding and agreed to proceed.  History of Present Illness:    Observations/Objective:   Assessment and Plan:   Follow Up Instructions:    I discussed the assessment and treatment plan with the patient. The patient was provided an opportunity to ask questions and all were answered. The patient agreed with the plan and demonstrated an understanding of the instructions.   The patient was advised to call back or seek an in-person evaluation if the symptoms worsen or if the condition fails to improve as anticipated. No doubt I provided 15 minutes of non-face-to-face time during this  encounter.       Review of Systems     Objective:   Physical Exam        Assessment & Plan:  Prediabetes-recent A1c look good we will relook again in the summertime watch diet try to lose weight  Moderate obesity/morbid obesity- dietary discretion and cutting back on calories as well as good food choices and regular physical activity  Anemia we will recheck this as well as may need further looking into  History of hyperlipidemia follow-up lipid profile  Mild stress related issues overall doing well denies being depressed has resumed Zoloft on a regular basis  Vitamin D deficiency 50,000 weekly for 1 month then after that 2000 units daily  Follow-up office visit 6 months

## 2018-08-11 ENCOUNTER — Other Ambulatory Visit: Payer: Self-pay

## 2018-08-11 ENCOUNTER — Ambulatory Visit (INDEPENDENT_AMBULATORY_CARE_PROVIDER_SITE_OTHER): Payer: 59 | Admitting: Family Medicine

## 2018-08-11 DIAGNOSIS — I10 Essential (primary) hypertension: Secondary | ICD-10-CM

## 2018-08-11 NOTE — Progress Notes (Signed)
   Subjective:    Patient ID: [name] West, male    DOB: [birth year], 38 y.o.   MRN: [id number]  HPI Pt needing to discuss lab work. Pt had lab orders put in on 05/18/2018 but has not had them completed.  This patient suffers with significant obesity.  He also previously had some stress related issues Zoloft doing well he would like to continue it denies being depressed Patient relates reflux under good control with PPI and also states he is taking his diuretic and potassium to keep blood pressure under good control.  Had labs this morning they are not available to discuss right now but we will be calling him with the results Virtual Visit via Video Note  I connected with [name] West on 08/11/18 at  3:30 PM EDT by a video enabled telemedicine application and verified that I am speaking with the correct person using two identifiers.  Location: Patient: home Provider: office   I discussed the limitations of evaluation and management by telemedicine and the availability of in person appointments. The patient expressed understanding and agreed to proceed.  History of Present Illness:    Observations/Objective:   Assessment and Plan:   Follow Up Instructions:    I discussed the assessment and treatment plan with the patient. The patient was provided an opportunity to ask questions and all were answered. The patient agreed with the plan and demonstrated an understanding of the instructions.   The patient was advised to call back or seek an in-person evaluation if the symptoms worsen or if the condition fails to improve as anticipated.  I provided 15 minutes of non-face-to-face time during this encounter.   Vicente Males, LPN    Review of Systems  Constitutional: Negative for activity change, fatigue and fever.  HENT: Negative for congestion and rhinorrhea.   Respiratory: Negative for cough and shortness of breath.   Cardiovascular: Negative for chest pain and leg swelling.   Gastrointestinal: Negative for abdominal pain, diarrhea and nausea.  Genitourinary: Negative for dysuria and hematuria.  Neurological: Negative for weakness and headaches.  Psychiatric/Behavioral: Negative for agitation and behavioral problems.       Objective:   Physical Exam   Today's visit was via telephone Physical exam was not possible for this visit      Assessment & Plan:  Hypertension good control continue current measures Moods doing well continue current measures Moderate obesity patient working hard at losing weight

## 2018-08-12 LAB — LIPID PANEL
Chol/HDL Ratio: 4.1 ratio (ref 0.0–5.0)
Cholesterol, Total: 169 mg/dL (ref 100–199)
HDL: 41 mg/dL (ref >39)
LDL Calculated: 115 mg/dL — ABNORMAL HIGH (ref 0–99)
Triglycerides: 65 mg/dL (ref 0–149)
VLDL Cholesterol Cal: 13 mg/dL (ref 5–40)

## 2018-08-12 LAB — BASIC METABOLIC PANEL
BUN/Creatinine Ratio: 14 (ref 9–20)
BUN: 12 mg/dL (ref 6–20)
CO2: 25 mmol/L (ref 20–29)
Calcium: 9.6 mg/dL (ref 8.7–10.2)
Chloride: 104 mmol/L (ref 96–106)
Creatinine, Ser: 0.86 mg/dL (ref 0.76–1.27)
GFR calc Af Amer: 126 mL/min/{1.73_m2} (ref >59)
GFR calc non Af Amer: 109 mL/min/{1.73_m2} (ref >59)
Glucose: 92 mg/dL (ref 65–99)
Potassium: 4.5 mmol/L (ref 3.5–5.2)
Sodium: 145 mmol/L — ABNORMAL HIGH (ref 134–144)

## 2018-08-12 LAB — CBC WITH DIFFERENTIAL/PLATELET
Basophils Absolute: 0.1 10*3/uL (ref 0.0–0.2)
Basos: 1 %
EOS (ABSOLUTE): 0.2 10*3/uL (ref 0.0–0.4)
Eos: 2 %
Hematocrit: 46.7 % (ref 37.5–51.0)
Hemoglobin: 15.2 g/dL (ref 13.0–17.7)
Immature Grans (Abs): 0 10*3/uL (ref 0.0–0.1)
Immature Granulocytes: 0 %
Lymphocytes Absolute: 1.8 10*3/uL (ref 0.7–3.1)
Lymphs: 18 %
MCH: 27.1 pg (ref 26.6–33.0)
MCHC: 32.5 g/dL (ref 31.5–35.7)
MCV: 83 fL (ref 79–97)
Monocytes Absolute: 0.7 10*3/uL (ref 0.1–0.9)
Monocytes: 7 %
Neutrophils Absolute: 7.2 10*3/uL — ABNORMAL HIGH (ref 1.4–7.0)
Neutrophils: 72 %
Platelets: 293 10*3/uL (ref 150–450)
RBC: 5.61 x10E6/uL (ref 4.14–5.80)
RDW: 13.8 % (ref 11.6–15.4)
WBC: 10 10*3/uL (ref 3.4–10.8)

## 2018-08-12 LAB — HEMOGLOBIN A1C
Est. average glucose Bld gHb Est-mCnc: 103 mg/dL
Hgb A1c MFr Bld: 5.2 % (ref 4.8–5.6)

## 2018-08-12 LAB — VITAMIN D 25 HYDROXY (VIT D DEFICIENCY, FRACTURES): Vit D, 25-Hydroxy: 37.9 ng/mL (ref 30.0–100.0)

## 2018-08-12 LAB — FERRITIN: Ferritin: 81 ng/mL (ref 30–400)

## 2018-08-14 ENCOUNTER — Emergency Department (HOSPITAL_COMMUNITY)
Admission: EM | Admit: 2018-08-14 | Discharge: 2018-08-14 | Disposition: A | Discharge status: Home / Self Care | Payer: No Typology Code available for payment source | Attending: Emergency Medicine | Admitting: Emergency Medicine

## 2018-08-14 ENCOUNTER — Encounter (HOSPITAL_COMMUNITY): Payer: Self-pay | Admitting: *Deleted

## 2018-08-14 ENCOUNTER — Other Ambulatory Visit: Payer: Self-pay

## 2018-08-14 ENCOUNTER — Emergency Department (HOSPITAL_COMMUNITY): Payer: No Typology Code available for payment source

## 2018-08-14 DIAGNOSIS — I10 Essential (primary) hypertension: Secondary | ICD-10-CM | POA: Diagnosis not present

## 2018-08-14 DIAGNOSIS — G43009 Migraine without aura, not intractable, without status migrainosus: Secondary | ICD-10-CM | POA: Diagnosis not present

## 2018-08-14 DIAGNOSIS — R51 Headache: Secondary | ICD-10-CM | POA: Diagnosis present

## 2018-08-14 DIAGNOSIS — Z79899 Other long term (current) drug therapy: Secondary | ICD-10-CM | POA: Diagnosis not present

## 2018-08-14 MED ORDER — METOCLOPRAMIDE HCL 5 MG/ML IJ SOLN
10.0000 mg | Freq: Once | INTRAMUSCULAR | Status: AC
  Administered 2018-08-14: 10 mg via INTRAVENOUS
  Filled 2018-08-14: qty 2

## 2018-08-14 MED ORDER — SODIUM CHLORIDE 0.9 % IV BOLUS
1000.0000 mL | Freq: Once | INTRAVENOUS | Status: AC
  Administered 2018-08-14: 1000 mL via INTRAVENOUS

## 2018-08-14 MED ORDER — DIPHENHYDRAMINE HCL 50 MG/ML IJ SOLN
25.0000 mg | Freq: Once | INTRAMUSCULAR | Status: AC
  Administered 2018-08-14: 25 mg via INTRAVENOUS
  Filled 2018-08-14: qty 1

## 2018-08-14 NOTE — Discharge Instructions (Addendum)
Drink plenty of fluids.  Let your doctor know if you should find a tick or you continue to have headache.  If you should break out in a rash you should be reevaluated.

## 2018-08-14 NOTE — ED Notes (Signed)
Pt ambulatory to waiting room. Pt verbalized understanding of discharge instructions.   

## 2018-08-14 NOTE — ED Provider Notes (Signed)
[facility] EMERGENCY DEPARTMENT Provider Note   CSN: [id number] Arrival date & time: 08/14/18  0039  Time seen 1:18 AM  History   Chief Complaint Chief Complaint  Patient presents with  . Headache    HPI [name] West is a 38 y.o. male.     HPI patient states he had gone camping all weekend and he was sweating a lot although he normally sweats a lot.  He states he went to bed early and about 1 to 2 hours ago he woke up and felt like "my brain was exploding".  He describes bitemporal headache that is sharp, pressure, and throbbing.  It does not affect his vision although he does describe photophobia.  He is unaware of phonophobia.  He denies any numbness or ting of his extremities.  He has had nausea and vomiting.  He states he found that if he laid his head back it seemed to feel better or if he stood up.  He also states cold compresses helped.  He states when he laid down he felt like his nose was fuller his sinuses were full.  He states he has never had headaches like this before.  There is no family history of migraines.  His wife took his blood pressure and it was 215/119.  He has a history of hypertension and she notes that when he goes to the doctor and pain he has high blood pressure but if he goes and is not in pain his blood pressure is usually 130/70-80.  He denies any family history of migraines.  He also notes that his urine has been dark since he has been camping.  PCP Kathyrn Drown, MD   Past Medical History:  Diagnosis Date  . Anxiety   . Gout   . HNP (herniated nucleus pulposus), lumbar   . HTN (hypertension)   . Hx of low back pain   . IBS (irritable bowel syndrome)   . Joint pain   . Kidney problem   . Kidney stones   . Leg edema   . Sleep apnea     Patient Active Problem List   Diagnosis Date Noted  . Herniated lumbar disc without myelopathy 02/21/2018  . Absolute anemia 02/07/2018  . Essential hypertension 02/07/2018  . Other hyperlipidemia  02/07/2018  . Vitamin D deficiency 12/29/2017  . Prediabetes 12/29/2017  . Class 3 severe obesity with serious comorbidity and body mass index (BMI) of 40.0 to 44.9 in adult ([place]) 12/29/2017  . Sleep apnea 12/22/2016  . Obesity (BMI 35.0-39.9 without comorbidity) 05/07/2016  . History of kidney stones 11/07/2015  . Anxiety as acute reaction to exceptional stress 09/14/2014    Past Surgical History:  Procedure Laterality Date  . BACK SURGERY    . KIDNEY STONE SURGERY    . LUMBAR LAMINECTOMY/DECOMPRESSION MICRODISCECTOMY Right 02/21/2018   Procedure: Right Lumbar Three-Four Lumbar Four-Five Microdiscectomy;  Surgeon: Erline Levine, MD;  Location: [facility];  Service: Neurosurgery;  Laterality: Right;  Right Lumbar Three-Four Lumbar Four-Five Microdiscectomy        Home Medications    Prior to Admission medications   Medication Sig Start Date End Date Taking? Authorizing Provider  hydrochlorothiazide (HYDRODIURIL) 25 MG tablet TAKE 1 TABLET ONCE DAILY FOR FLUID AND BLOOD PRESSURE. 05/18/18   Kathyrn Drown, MD  methocarbamol (ROBAXIN) 500 MG tablet Take 1 tablet (500 mg total) by mouth every 6 (six) hours as needed for muscle spasms. 02/22/18   Erline Levine, MD  pantoprazole ([place])  40 MG tablet Take 1 tablet (40 mg total) by mouth daily. 05/18/18   Kathyrn Drown, MD  potassium chloride (K-DUR) 10 MEQ tablet Take 1 tablet (10 mEq total) by mouth daily. 05/18/18   Kathyrn Drown, MD  sertraline (ZOLOFT) 50 MG tablet Take 1 tablet (50 mg total) by mouth daily. 05/18/18   Kathyrn Drown, MD  Vitamin D, Ergocalciferol, (DRISDOL) 1.25 MG (50000 UT) CAPS capsule Take 1 capsule (50,000 Units total) by mouth every 7 (seven) days. 05/18/18   Kathyrn Drown, MD    Family History Family History  Problem Relation Age of Onset  . Cancer Mother        Lung  . Hyperlipidemia Father   . Heart disease Father   . Kidney Stones Father   . Heart disease Paternal Grandfather 60       died of MI     Social History Social History   Tobacco Use  . Smoking status: Never Smoker  . Smokeless tobacco: Never Used  Substance Use Topics  . Alcohol use: Yes    Comment: occas social  . Drug use: No     Allergies   Patient has no known allergies.   Review of Systems Review of Systems  All other systems reviewed and are negative.    Physical Exam Updated Vital Signs BP (!) 184/104 (BP Location: Right Arm)   Pulse (!) 59   Temp 98.2 F (36.8 C) (Oral)   Resp 16   Ht 5\' 9"  (1.753 m)   Wt 119.7 kg   SpO2 100%   BMI 38.99 kg/m   Vital signs normal except hypertension   Physical Exam Vitals signs and nursing note reviewed.  Constitutional:      General: He is not in acute distress.    Appearance: Normal appearance. He is well-developed. He is not ill-appearing or toxic-appearing.  HENT:     Head: Normocephalic and atraumatic.     Right Ear: External ear normal.     Left Ear: External ear normal.     Nose: Nose normal. No mucosal edema or rhinorrhea.     Mouth/Throat:     Mouth: Mucous membranes are dry.     Dentition: No dental abscesses.     Pharynx: Oropharynx is clear. No uvula swelling.  Eyes:     Extraocular Movements: Extraocular movements intact.     Conjunctiva/sclera: Conjunctivae normal.     Pupils: Pupils are equal, round, and reactive to light.  Neck:     Musculoskeletal: Full passive range of motion without pain, normal range of motion and neck supple.  Cardiovascular:     Rate and Rhythm: Normal rate and regular rhythm.     Heart sounds: Normal heart sounds. No murmur. No friction rub. No gallop.   Pulmonary:     Effort: Pulmonary effort is normal. No respiratory distress.     Breath sounds: Normal breath sounds. No wheezing, rhonchi or rales.  Chest:     Chest wall: No tenderness or crepitus.  Musculoskeletal: Normal range of motion.        General: No tenderness.     Comments: Moves all extremities well.   Skin:    General: Skin is warm and  dry.     Coloration: Skin is not pale.     Findings: No erythema or rash.  Neurological:     General: No focal deficit present.     Mental Status: He is alert and oriented to person, place,  and time.     Cranial Nerves: No cranial nerve deficit.  Psychiatric:        Mood and Affect: Mood normal. Mood is not anxious.        Speech: Speech normal.        Behavior: Behavior normal.        Thought Content: Thought content normal.      ED Treatments / Results  Labs (all labs ordered are listed, but only abnormal results are displayed) Labs Reviewed - No data to display  EKG None  Radiology Ct Head Wo Contrast  Result Date: 08/14/2018 CLINICAL DATA:  Headache and photophobia EXAM: CT HEAD WITHOUT CONTRAST TECHNIQUE: Contiguous axial images were obtained from the base of the skull through the vertex without intravenous contrast. COMPARISON:  None. FINDINGS: Brain: There is no mass, hemorrhage or extra-axial collection. The size and configuration of the ventricles and extra-axial CSF spaces are normal. The brain parenchyma is normal, without acute or chronic infarction. Vascular: No abnormal hyperdensity of the major intracranial arteries or dural venous sinuses. No intracranial atherosclerosis. Skull: The visualized skull base, calvarium and extracranial soft tissues are normal. Sinuses/Orbits: No fluid levels or advanced mucosal thickening of the visualized paranasal sinuses. No mastoid or middle ear effusion. The orbits are normal. IMPRESSION: Normal brain. Electronically Signed   By: Ulyses Jarred M.D.   On: 08/14/2018 02:11    Procedures Procedures (including critical care time)  Medications Ordered in ED Medications  sodium chloride 0.9 % bolus 1,000 mL (1,000 mLs Intravenous New Bag/Given 08/14/18 0143)  metoCLOPramide (REGLAN) injection 10 mg (10 mg Intravenous Given 08/14/18 0143)  diphenhydrAMINE (BENADRYL) injection 25 mg (25 mg Intravenous Given 08/14/18 0143)     Initial  Impression / Assessment and Plan / ED Course  I have reviewed the triage vital signs and the nursing notes.  Pertinent labs & imaging results that were available during my care of the patient were reviewed by me and considered in my medical decision making (see chart for details).    Patient was given IV fluids, he was given IV Reglan and Benadryl for his migraine type headache.  I suspect he may have been out all weekend with the heat index being high and lots of sweating and is dehydrated which may have triggered his headache.  Since he does not have a history headache CT of the head was done.  Recheck at 2:50 AM patient states he is feeling much better.  He states he got up to urinate and urinated a lot of clear urine.  His blood pressure has improved to 134/69.  He denies seeing any ticks but we discussed if he found a tick to let his doctor know and at that point they might want to start doxycycline.  Final Clinical Impressions(s) / ED Diagnoses   Final diagnoses:  Migraine without aura and without status migrainosus, not intractable    ED Discharge Orders    None     Plan discharge  Rolland Porter, MD, Barbette Or, MD 08/14/18 [phone]

## 2018-08-14 NOTE — ED Triage Notes (Signed)
Pt states he woke up with a headache and got up to go to bathroom; when he came back and laid down he felt nauseous and vomited x one at home;  pt's BP at home was 215/119

## 2018-08-15 ENCOUNTER — Other Ambulatory Visit: Payer: Self-pay | Admitting: Neurosurgery

## 2018-08-15 DIAGNOSIS — M5416 Radiculopathy, lumbar region: Secondary | ICD-10-CM

## 2018-08-17 ENCOUNTER — Other Ambulatory Visit: Payer: Self-pay | Admitting: Neurosurgery

## 2018-08-17 ENCOUNTER — Ambulatory Visit (HOSPITAL_COMMUNITY)
Admission: RE | Admit: 2018-08-17 | Discharge: 2018-08-17 | Disposition: A | Discharge status: Home / Self Care | Payer: No Typology Code available for payment source | Source: Ambulatory Visit | Attending: Neurosurgery | Admitting: Neurosurgery

## 2018-08-17 ENCOUNTER — Other Ambulatory Visit: Payer: Self-pay

## 2018-08-17 DIAGNOSIS — M5416 Radiculopathy, lumbar region: Secondary | ICD-10-CM | POA: Insufficient documentation

## 2018-08-17 MED ORDER — GADOBUTROL 1 MMOL/ML IV SOLN
10.0000 mL | Freq: Once | INTRAVENOUS | Status: AC | PRN
  Administered 2018-08-17: 10:00:00 10 mL via INTRAVENOUS

## 2018-10-19 ENCOUNTER — Other Ambulatory Visit: Payer: Self-pay

## 2018-10-19 ENCOUNTER — Encounter (HOSPITAL_COMMUNITY): Payer: Self-pay

## 2018-10-19 ENCOUNTER — Ambulatory Visit (HOSPITAL_COMMUNITY): Payer: No Typology Code available for payment source | Attending: Neurosurgery

## 2018-10-19 DIAGNOSIS — G8929 Other chronic pain: Secondary | ICD-10-CM | POA: Insufficient documentation

## 2018-10-19 DIAGNOSIS — M6281 Muscle weakness (generalized): Secondary | ICD-10-CM | POA: Insufficient documentation

## 2018-10-19 DIAGNOSIS — M545 Low back pain, unspecified: Secondary | ICD-10-CM

## 2018-10-19 NOTE — Therapy (Signed)
[place], [place], [address] Phone: [phone]   Fax:  [phone]  Physical Therapy Evaluation  Patient Details  Name: [name] West MRN: [id number] Date of Birth: Jun 30, 1980 Referring Provider (PT): Erline Levine, MD   Encounter Date: 10/19/2018  PT End of Session - 10/19/18 0952    Visit Number  1    Number of Visits  8    Date for PT Re-Evaluation  11/16/18    Authorization Type  [employer]    Authorization Time Period  10/19/18 to 11/17/18    PT Start Time  0900    PT Stop Time  0942    PT Time Calculation (min)  42 min    Activity Tolerance  Patient tolerated treatment well;No increased pain    Behavior During Therapy  WFL for tasks assessed/performed       Past Medical History:  Diagnosis Date  . Anxiety   . Gout   . HNP (herniated nucleus pulposus), lumbar   . HTN (hypertension)   . Hx of low back pain   . IBS (irritable bowel syndrome)   . Joint pain   . Kidney problem   . Kidney stones   . Leg edema   . Sleep apnea     Past Surgical History:  Procedure Laterality Date  . BACK SURGERY    . KIDNEY STONE SURGERY    . LUMBAR LAMINECTOMY/DECOMPRESSION MICRODISCECTOMY Right 02/21/2018   Procedure: Right Lumbar Three-Four Lumbar Four-Five Microdiscectomy;  Surgeon: Erline Levine, MD;  Location: [facility];  Service: Neurosurgery;  Laterality: Right;  Right Lumbar Three-Four Lumbar Four-Five Microdiscectomy    There were no vitals filed for this visit.   Subjective Assessment - 10/19/18 0905    Subjective  Pt reports 2nd surgery was more tedious than first. Pt reports lower part of his back on both sides of spine have been so sore, unable to stand up straight, requiring 10-15 minutes before can stand upright. Pt reports has 39 year old and he couldn't hold him for 20 minutes at a time. Pt reports noticing leaning to L to compensate for pain. Pt reports starting a job at [employer], standing for 10 hr  shifts with 3 breaks throughout the day, required to stretch 2x/day which helped. Pt reports started diclofenac 10/11/18 and it has significantly improved his pain. Pt reports he can now walk, hold his child, no longer leaning to the side but will still occasionally move the wrong way and his back will catch. Pt denies headaches, falls, foot drop, loss of b/b. Pt reports no PT after first surgical intervention and after second surgery had newborn at home he had take care of so he did more activity. 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    Pertinent History  lumbar laminectomy/decompression and R L3-4 and L4-5 microdiscectomy 02/21/18; left L4-5 microdiscectomy 03/18/17    How long can you sit comfortably?  no issues    How long can you stand comfortably?  no issues    How long can you walk comfortably?  no issues    Diagnostic tests  MRI 8/6- interval post-op changes L3-4 and L4-5 without recurrent spinal stenosis; unchanged mild bil neural foraminal stenosis    Patient Stated Goals  improve soreness    Currently in Pain?  No/denies         [facility] PT Assessment - 10/19/18 0001  Assessment   Medical Diagnosis  LBP    Referring Provider (PT)  Erline Levine, MD    Onset Date/Surgical Date  02/21/18    Next MD Visit  after therapy if needed    Prior Therapy  None      Precautions   Precautions  None      Restrictions   Weight Bearing Restrictions  No      Balance Screen   Has the patient fallen in the past 6 months  No    Has the patient had a decrease in activity level because of a fear of falling?   No    Is the patient reluctant to leave their home because of a fear of falling?   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    Leisure  camping, 4-wheeler riding, outdoor, play with kids      Cognition   Overall Cognitive Status  Within Functional Limits for  tasks assessed      Observation/Other Assessments   Focus on Therapeutic Outcomes (FOTO)   35% limited      Sensation   Light Touch  Appears Intact      ROM / Strength   AROM / PROM / Strength  AROM;Strength      AROM   Overall AROM Comments  denies pain with all movements    AROM Assessment Site  Lumbar    Lumbar Flexion  10" from floor    Lumbar Extension  50% limited    Lumbar - Right Side Bend  joint line    Lumbar - Left Side Bend  joint line    Lumbar - Right Rotation  WFL    Lumbar - Left Rotation  WFL      Strength   Overall Strength Comments  slight posterior trunk lean demonstrating weak core with sitting    Strength Assessment Site  Hip;Knee;Ankle    Right Hip Flexion  5/5    Right Hip Extension  4+/5    Right Hip ABduction  5/5    Left Hip Flexion  5/5    Left Hip Extension  4+/5    Left Hip ABduction  5/5    Right Knee Flexion  5/5    Right Knee Extension  5/5    Left Knee Flexion  4+/5    Left Knee Extension  5/5    Right Ankle Dorsiflexion  5/5    Right Ankle Plantar Flexion  5/5    Left Ankle Dorsiflexion  4+/5    Left Ankle Plantar Flexion  5/5      Flexibility   Soft Tissue Assessment /Muscle Length  yes    Hamstrings  90/90: WFL bilaterally    Quadriceps  Ely's: WFL, no pain bilaterally    ITB  WFL bilaterally      Palpation   Spinal mobility  hypomobile    SI assessment   denies pain    Palpation comment  denies pain throughout bil lumbar paraspinals, piriformis      Special Tests    Special Tests  Lumbar    Lumbar Tests  Straight Leg Raise;Slump Test      Slump test   Findings  Negative    Comment  bilaterally      Straight Leg Raise   Findings  Negative    Comment  bilaterally      Ambulation/Gait  Gait Pattern  Within Functional Limits    Gait Comments  observed pt ambulate into/out of clinic without difficulty      Balance   Balance Assessed  Yes      Static Standing Balance   Static Standing - Balance Support  No upper  extremity supported    Static Standing Balance -  Activities   Single Leg Stance - Right Leg;Single Leg Stance - Left Leg    Static Standing - Comment/# of Minutes  30+ sec bilaterally         Objective measurements completed on examination: See above findings.        PT Education - 10/19/18 0954    Education Details  Evaluation findings, FOTO findings, POC, initiated HEP    Person(s) Educated  Patient    Methods  Explanation;Demonstration;Handout    Comprehension  Verbalized understanding;Returned demonstration       PT Short Term Goals - 10/19/18 1110      PT SHORT TERM GOAL #1   Title  Pt will be independent with HEP, performing 3x/week to improve activity tolerance and reduce pain.    Time  2    Period  Weeks    Status  New    Target Date  11/02/18        PT Long Term Goals - 10/19/18 1111      PT LONG TERM GOAL #1   Title  Pt will report 5/10 pain at worst when exercising, caring for children, and completing household chores to demo improved functional strength and activity tolerance.    Time  4    Period  Weeks    Status  New    Target Date  11/16/18      PT LONG TERM GOAL #2   Title  Pt will maintain plank position for 60 seconds, without lumbar extension, scapular winging, or rest break to demo improved core strength.    Time  4    Period  Weeks    Status  New      PT LONG TERM GOAL #3   Title  Pt will demonstrate lumbar AROM WNL and without pain to improve ability to complete yard work, camping activities, and other recreational activities.    Time  4    Period  Weeks    Status  New             Plan - 10/19/18 0954    Clinical Impression Statement  Pt is a pleasant 38YO male with s/p lumbar laminectomy/decompression and R L3-4 and L4-5 microdiscectomy on 02/21/18 presenting with low back pain.  Pt with mild strength deficits noted with MMT. Pt with decreased lumbar AROM, but denies pain with movements. Pt with negative SLR test and slump test  bilaterally. Pt with core weakness noted with MMT due to posterior trunk lean. Pt would benefit from small POC to educate on strengthening for core and BLE, improve activity tolerance, lifting mechanics education, and develop HEP to reduce pain and improve activity tolerance at home.    Personal Factors and Comorbidities  Comorbidity 1    Comorbidities  obesity    Examination-Activity Limitations  Lift    Examination-Participation Restrictions  Interpersonal Relationship    Stability/Clinical Decision Making  Stable/Uncomplicated    Clinical Decision Making  Low    Rehab Potential  Good    PT Frequency  2x / week    PT Duration  4 weeks    PT Treatment/Interventions  ADLs/Self Care Home  Management;Aquatic Therapy;Biofeedback;Contrast Bath;DME Instruction;Gait training;Stair training;Functional mobility training;Therapeutic activities;Therapeutic exercise;Balance training;Traction;Neuromuscular re-education;Patient/family education;Orthotic Fit/Training;Manual techniques;Scar mobilization;Passive range of motion;Dry needling;Taping;Joint Manipulations    PT Next Visit Plan  Progress core strengthening. Initiate lifting techniques. Update HEP with strengthening for core and glutes.    PT Home Exercise Plan  Eval: TA set with exhale    Consulted and Agree with Plan of Care  Patient       Patient will benefit from skilled therapeutic intervention in order to improve the following deficits and impairments:  Decreased strength, Impaired perceived functional ability, Obesity, Pain  Visit Diagnosis: Chronic midline low back pain without sciatica  Muscle weakness (generalized)     Problem List Patient Active Problem List   Diagnosis Date Noted  . Herniated lumbar disc without myelopathy 02/21/2018  . Absolute anemia 02/07/2018  . Essential hypertension 02/07/2018  . Other hyperlipidemia 02/07/2018  . Vitamin D deficiency 12/29/2017  . Prediabetes 12/29/2017  . Class 3 severe obesity with  serious comorbidity and body mass index (BMI) of 40.0 to 44.9 in adult ([place]) 12/29/2017  . Sleep apnea 12/22/2016  . Obesity (BMI 35.0-39.9 without comorbidity) 05/07/2016  . History of kidney stones 11/07/2015  . Anxiety as acute reaction to exceptional stress 09/14/2014     Talbot Grumbling PT, DPT 10/19/18, 12:28 PM [place], [place], [address] Phone: [phone]   Fax:  [phone]  Name: [name] West MRN: [id number] Date of Birth: 03/17/1980

## 2018-10-24 ENCOUNTER — Other Ambulatory Visit: Payer: Self-pay

## 2018-10-24 ENCOUNTER — Encounter: Payer: Self-pay | Admitting: Family Medicine

## 2018-10-24 ENCOUNTER — Ambulatory Visit (HOSPITAL_COMMUNITY): Payer: No Typology Code available for payment source | Admitting: Physical Therapy

## 2018-10-24 DIAGNOSIS — M6281 Muscle weakness (generalized): Secondary | ICD-10-CM

## 2018-10-24 DIAGNOSIS — G8929 Other chronic pain: Secondary | ICD-10-CM

## 2018-10-24 DIAGNOSIS — M545 Low back pain, unspecified: Secondary | ICD-10-CM
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
## 2018-10-31 ENCOUNTER — Ambulatory Visit (HOSPITAL_COMMUNITY): Payer: No Typology Code available for payment source

## 2018-10-31 ENCOUNTER — Other Ambulatory Visit: Payer: Self-pay

## 2018-10-31 ENCOUNTER — Encounter (HOSPITAL_COMMUNITY): Payer: Self-pay

## 2018-10-31 DIAGNOSIS — M545 Low back pain: Secondary | ICD-10-CM

## 2018-10-31 DIAGNOSIS — G8929 Other chronic pain: Secondary | ICD-10-CM

## 2018-10-31 DIAGNOSIS — M6281 Muscle weakness (generalized): Secondary | ICD-10-CM

## 2018-10-31 NOTE — Patient Instructions (Addendum)
Lower Trunk Rotation Stretch    Keeping back flat and feet together, rotate knees to left side. Hold 10 seconds. Repeat 5  times per set. Do 1 sets per session. Do 1-2 sessions per day.  http://orth.exer.us/122   Copyright  VHI. All rights reserved.   Bridging    Slowly raise buttocks from floor, keeping stomach tight. Repeat 10 times per set. Do 2 sets per session. Do 1-2 sessions per day.  http://orth.exer.us/1096   Copyright  VHI. All rights reserved.   Bracing With Leg March (Hook-Lying)    With neutral spine, tighten pelvic floor and abdominals and hold. Alternating legs, lift foot 10-12 inches and return to floor.  Repeat 10 times. Do 1-2 times a day.  Copyright  VHI. All rights reserved.   Hamstring Step 3    Left leg in maximal straight leg raise, heel at maximal stretch, straighten knee further by tightening knee cap. Warning: Intense stretch. Stay within tolerance. Hold 30 seconds. Relax knee cap only. Repeat 3 times.  Copyright  VHI. All rights reserved.   Squat (Front)    Repeat 10 times per set. Rest 5 seconds after set. Do 2 sets per session.  Copyright  VHI. All rights reserved.

## 2018-10-31 NOTE — Therapy (Signed)
[facility] [place], [place], [address] Phone: [phone]   Fax:  [phone]  Physical Therapy Treatment  Patient Details  Name: [name] West MRN: [id number] Date of Birth: 1980-07-26 Referring Provider (PT): Erline Levine, MD   Encounter Date: 10/31/2018  PT End of Session - 10/31/18 0923    Visit Number  3    Number of Visits  8    Date for PT Re-Evaluation  11/16/18    Authorization Type  [facility] [employer]    Authorization Time Period  10/19/18 to 11/17/18    PT Start Time  0918    PT Stop Time  0956    PT Time Calculation (min)  38 min    Activity Tolerance  Patient tolerated treatment well;No increased pain    Behavior During Therapy  WFL for tasks assessed/performed       Past Medical History:  Diagnosis Date  . Anxiety   . Gout   . HNP (herniated nucleus pulposus), lumbar   . HTN (hypertension)   . Hx of low back pain   . IBS (irritable bowel syndrome)   . Joint pain   . Kidney problem   . Kidney stones   . Leg edema   . Sleep apnea     Past Surgical History:  Procedure Laterality Date  . BACK SURGERY    . KIDNEY STONE SURGERY    . LUMBAR LAMINECTOMY/DECOMPRESSION MICRODISCECTOMY Right 02/21/2018   Procedure: Right Lumbar Three-Four Lumbar Four-Five Microdiscectomy;  Surgeon: Erline Levine, MD;  Location: [facility];  Service: Neurosurgery;  Laterality: Right;  Right Lumbar Three-Four Lumbar Four-Five Microdiscectomy    There were no vitals filed for this visit.  Subjective Assessment - 10/31/18 0921    Subjective  Pt stated he's doing well, no reports of pain.  Has been trying to incorporate abdominal sets through out the day.    Pertinent History  lumbar laminectomy/decompression and R L3-4 and L4-5 microdiscectomy 02/21/18; left L4-5 microdiscectomy 03/18/17    Patient Stated Goals  improve soreness    Currently in Pain?  No/denies         [facility] PT Assessment - 10/31/18 0001      Assessment   Medical Diagnosis  LBP    Referring Provider (PT)  Erline Levine, MD    Onset Date/Surgical Date  02/21/18    Next MD Visit  after therapy if needed    Prior Therapy  None      Precautions   Precautions  None                   [facility] Adult PT Treatment/Exercise - 10/31/18 0001      Lumbar Exercises: Stretches   Active Hamstring Stretch  Right;Left;3 reps;30 seconds    Active Hamstring Stretch Limitations  supine    Lower Trunk Rotation  5 reps;10 seconds      Lumbar Exercises: Standing   Heel Raises  10 reps    Heel Raises Limitations  squat then heel raise    Functional Squats  10 reps    Functional Squats Limitations  2 sets    Forward Lunge  10 reps    Other Standing Lumbar Exercises  Vector stance wiht core activation 3x 5" BLE      Lumbar Exercises: Supine   Bridge  10 reps    Bridge Limitations  2 sets    Other Supine Lumbar Exercises  march with ab sets 10x 3"  Lumbar Exercises: Quadruped   Single Arm Raise  Right;Left;5 reps;5 seconds    Straight Leg Raise  5 reps;5 seconds    Opposite Arm/Leg Raise  Right arm/Left leg;Left arm/Right leg;10 reps;5 seconds    Opposite Arm/Leg Raise Limitations  cueing for breathing               PT Short Term Goals - 10/24/18 0936      PT SHORT TERM GOAL #1   Title  Pt will be independent with HEP, performing 3x/week to improve activity tolerance and reduce pain.    Time  2    Period  Weeks    Status  On-going    Target Date  11/02/18        PT Long Term Goals - 10/24/18 0937      PT LONG TERM GOAL #1   Title  Pt will report 5/10 pain at worst when exercising, caring for children, and completing household chores to demo improved functional strength and activity tolerance.    Time  4    Period  Weeks    Status  On-going      PT LONG TERM GOAL #2   Title  Pt will maintain plank position for 60 seconds, without lumbar extension, scapular winging, or rest break to demo improved core strength.     Time  4    Period  Weeks    Status  On-going      PT LONG TERM GOAL #3   Title  Pt will demonstrate lumbar AROM WNL and without pain to improve ability to complete yard work, camping activities, and other recreational activities.    Time  4    Period  Weeks    Status  On-going            Plan - 10/31/18 1008    Clinical Impression Statement  Session focus on core and gluteal strengthening paired with breathing to reduce valsalva maneuver.  Progressed core/glut strengthening with additional exercises including quadruped for core stability and standing activities including squats and forward lunges.  Reviewed compliance with HEP with additional exercises given on printout to continue at home.  Pt plans to begin vacation at beach, reviewed frequency and compliance iwht additional exercises.  No reports of pain thorugh session.    Personal Factors and Comorbidities  Comorbidity 1    Comorbidities  obesity    Examination-Activity Limitations  Lift    Examination-Participation Restrictions  Interpersonal Relationship    Stability/Clinical Decision Making  Stable/Uncomplicated    Clinical Decision Making  Low    Rehab Potential  Good    PT Frequency  2x / week    PT Duration  4 weeks    PT Treatment/Interventions  ADLs/Self Care Home Management;Aquatic Therapy;Biofeedback;Contrast Bath;DME Instruction;Gait training;Stair training;Functional mobility training;Therapeutic activities;Therapeutic exercise;Balance training;Traction;Neuromuscular re-education;Patient/family education;Orthotic Fit/Training;Manual techniques;Scar mobilization;Passive range of motion;Dry needling;Taping;Joint Manipulations    PT Next Visit Plan  Progress core strengthening. Progress to lifting techniques next session as well as advance core strengthening (paloff, balance activities on foam, hip hinging activities as appropriate.). Update HEP with strengthening for core and glutes PRN.    PT Home Exercise Plan  Eval: TA  set with exhale       Patient will benefit from skilled therapeutic intervention in order to improve the following deficits and impairments:  Decreased strength, Impaired perceived functional ability, Obesity, Pain  Visit Diagnosis: Chronic midline low back pain without sciatica  Muscle weakness (generalized)     Problem  List Patient Active Problem List   Diagnosis Date Noted  . Herniated lumbar disc without myelopathy 02/21/2018  . Absolute anemia 02/07/2018  . Essential hypertension 02/07/2018  . Other hyperlipidemia 02/07/2018  . Vitamin D deficiency 12/29/2017  . Prediabetes 12/29/2017  . Class 3 severe obesity with serious comorbidity and body mass index (BMI) of 40.0 to 44.9 in adult ([place]) 12/29/2017  . Sleep apnea 12/22/2016  . Obesity (BMI 35.0-39.9 without comorbidity) 05/07/2016  . History of kidney stones 11/07/2015  . Anxiety as acute reaction to exceptional stress 09/14/2014   Ihor Austin, [place]; [place]  Aldona Lento 10/31/2018, 2:42 PM  [facility] [place], [place], [address] Phone: [phone]   Fax:  [phone]  Name: [name] West MRN: [id number] Date of Birth: [birth year]

## 2018-11-01 ENCOUNTER — Ambulatory Visit: Payer: No Typology Code available for payment source | Admitting: Family Medicine

## 2018-11-02 ENCOUNTER — Encounter (HOSPITAL_COMMUNITY): Payer: No Typology Code available for payment source

## 2018-11-07 ENCOUNTER — Encounter (HOSPITAL_COMMUNITY): Payer: No Typology Code available for payment source

## 2018-11-09 ENCOUNTER — Encounter (HOSPITAL_COMMUNITY): Payer: No Typology Code available for payment source

## 2018-11-14 ENCOUNTER — Encounter (HOSPITAL_COMMUNITY): Payer: No Typology Code available for payment source

## 2018-11-16 ENCOUNTER — Encounter (HOSPITAL_COMMUNITY): Payer: No Typology Code available for payment source

## 2018-11-21 ENCOUNTER — Telehealth (HOSPITAL_COMMUNITY): Payer: Self-pay | Admitting: Physical Therapy

## 2018-11-21 ENCOUNTER — Encounter (HOSPITAL_COMMUNITY): Payer: Self-pay | Admitting: Physical Therapy

## 2018-11-21 ENCOUNTER — Ambulatory Visit (HOSPITAL_COMMUNITY): Payer: No Typology Code available for payment source | Admitting: Physical Therapy

## 2018-11-21 NOTE — Therapy (Signed)
[facility] [address] [place], [place], [address] Phone: [phone]   Fax:  [phone]  Patient Details  Name: [name] West MRN: [id number] Date of Birth: 03/10/1980 Referring Provider:  No ref. provider found  Encounter Date: 11/21/2018   PHYSICAL THERAPY DISCHARGE SUMMARY  Visits from Start of Care: 3  Current functional level related to goals / functional outcomes: Not known as patient did not return for follow up assessment.    Remaining deficits: Not known as patient did not return for follow up assessment.    Education / Equipment: Spoke with patient on phone. Patient says he is doing well and requests DC at this time. Patient instructed to follow up with physical therapy services with any further questions or concerns.   Plan: Patient agrees to discharge.  Patient goals were not met. Patient is being discharged due to not returning since the last visit.  ?????        11:25 AM, 11/21/18 Cameron Caffaro PT DPT  Physical Therapist with [place]  [facility]  [phone]   [facility] [address] [place], [place], [address] Phone: [phone]   Fax:  [phone] 

## 2018-11-21 NOTE — Telephone Encounter (Signed)
Spoke with patient about missed appointment today. Patient says he forgot about appointment, is doing well, and has not had any issues the last 3 weeks. Patient says at this time he would like to be discharged. Patient instructed to follow up with physical therapy with any further questions or concerns.  11:30 AM, 11/21/18 Josue Hector PT DPT  Physical Therapist with [facility]  [phone]

## 2018-11-23 ENCOUNTER — Encounter (HOSPITAL_COMMUNITY): Payer: No Typology Code available for payment source

## 2018-11-28 ENCOUNTER — Encounter (HOSPITAL_COMMUNITY): Payer: No Typology Code available for payment source

## 2018-11-30 ENCOUNTER — Encounter (HOSPITAL_COMMUNITY): Payer: Self-pay

## 2018-11-30 ENCOUNTER — Encounter (HOSPITAL_COMMUNITY): Payer: No Typology Code available for payment source

## 2018-12-05 ENCOUNTER — Encounter (HOSPITAL_COMMUNITY): Payer: No Typology Code available for payment source | Admitting: Physical Therapy

## 2019-02-17 ENCOUNTER — Encounter: Payer: Self-pay | Admitting: Family Medicine

## 2019-02-19 MED ORDER — DICLOFENAC SODIUM 75 MG PO TBEC: 75.0000 mg | DELAYED_RELEASE_TABLET | Freq: Two times a day (BID) | ORAL | 1 refills | Status: DC | PRN

## 2019-02-19 NOTE — Telephone Encounter (Signed)
Nurses  May send in diclofenac 75 mg 1 twice daily as needed #60 with 1 refill

## 2019-02-19 NOTE — Addendum Note (Signed)
Addended by: Dairl Ponder on: 02/19/2019 02:57 PM   Modules accepted: Orders

## 2019-04-03 ENCOUNTER — Telehealth: Payer: Self-pay | Admitting: Family Medicine

## 2019-04-03 MED ORDER — TAMSULOSIN HCL 0.4 MG PO CAPS: 0.4000 mg | ORAL_CAPSULE | Freq: Every day | ORAL | 0 refills | Status: DC | PRN

## 2019-04-03 NOTE — Telephone Encounter (Signed)
Flomax 0.4 mg, #20, 1 daily as needed kidney stone, if ongoing troubles can do virtual visit or in person visit

## 2019-04-03 NOTE — Telephone Encounter (Signed)
Prescription sent electronically to pharmacy. Patient notified. 

## 2019-04-03 NOTE — Telephone Encounter (Signed)
Patient is requesting Flomax for passing a kidney stone.[place]

## 2019-04-03 NOTE — Telephone Encounter (Signed)
Patient states he has a history of kidney stones and had one lasered a while back but was told by specialist his body would continue to produce them and he would have issues from time to time. Patient states he started feeling one yesterday evening and it has been bothering him all day

## 2019-05-19 ENCOUNTER — Other Ambulatory Visit: Payer: Self-pay | Admitting: Family Medicine

## 2019-05-19 DIAGNOSIS — I10 Essential (primary) hypertension: Secondary | ICD-10-CM

## 2019-05-21 NOTE — Telephone Encounter (Signed)
Please schedule and route back to nurses to send in refill. thanks

## 2019-05-21 NOTE — Telephone Encounter (Signed)
May have this and 1 refill needs follow-up office visit

## 2019-05-21 NOTE — Telephone Encounter (Signed)
08/11/18 was last med check up

## 2019-05-21 NOTE — Telephone Encounter (Signed)
lvm to schedule appt.  

## 2019-05-22 NOTE — Telephone Encounter (Signed)
lvm to schedule appt.  

## 2019-05-23 NOTE — Telephone Encounter (Signed)
Scheduled 6/7

## 2019-06-18 ENCOUNTER — Telehealth: Payer: No Typology Code available for payment source | Admitting: Family Medicine

## 2019-06-18 ENCOUNTER — Other Ambulatory Visit: Payer: Self-pay

## 2019-07-04 ENCOUNTER — Other Ambulatory Visit: Payer: Self-pay

## 2019-07-04 ENCOUNTER — Encounter: Payer: No Typology Code available for payment source | Admitting: Family Medicine

## 2019-07-04 NOTE — Progress Notes (Signed)
   Subjective:    Patient ID: [name] West, male    DOB: [birth year], 39 y.o.   MRN: [id number]  HPI    Review of Systems     Objective:   Physical Exam        Assessment & Plan:

## 2019-07-08 NOTE — Progress Notes (Signed)
This encounter was created in error - please disregard.

## 2019-10-10 ENCOUNTER — Telehealth: Payer: Self-pay

## 2019-10-10 MED ORDER — PREDNISONE 20 MG PO TABS: ORAL_TABLET | ORAL | 0 refills | Status: DC

## 2019-10-10 NOTE — Telephone Encounter (Signed)
So in these situations prednisone can be helpful Prednisone 20 mg, #18 3qd for 3d then 2qd for 3d then 1qd for 3d If he does not want to do prednisone we can do a steroid cream

## 2019-10-10 NOTE — Telephone Encounter (Signed)
Patient is ok with prednisone taper and would like it sent to [facility] in [place]. Prescription sent electronically to pharmacy.

## 2019-10-10 NOTE — Telephone Encounter (Signed)
Pt has chiggers, they have tried benadryl cream and hydrocortisone cream and nothing is providing relief. He has them on his stomach and legs.   [name] West

## 2019-10-15 ENCOUNTER — Other Ambulatory Visit: Payer: Self-pay

## 2019-10-15 ENCOUNTER — Ambulatory Visit (INDEPENDENT_AMBULATORY_CARE_PROVIDER_SITE_OTHER): Payer: No Typology Code available for payment source | Admitting: Family Medicine

## 2019-10-15 ENCOUNTER — Other Ambulatory Visit (HOSPITAL_COMMUNITY): Payer: Self-pay | Admitting: Family Medicine

## 2019-10-15 VITALS — BP 146/84 | Temp 98.1°F | Ht 69.0 in | Wt 264.2 lb

## 2019-10-15 DIAGNOSIS — I10 Essential (primary) hypertension: Secondary | ICD-10-CM

## 2019-10-15 MED ORDER — HYDROCHLOROTHIAZIDE 25 MG PO TABS: ORAL_TABLET | ORAL | 1 refills | Status: DC

## 2019-10-15 MED ORDER — AMLODIPINE BESYLATE 5 MG PO TABS: 5.0000 mg | ORAL_TABLET | Freq: Every day | ORAL | 1 refills | Status: DC

## 2019-10-15 NOTE — Progress Notes (Signed)
   Subjective:    Patient ID: [name] West, male    DOB: 05-Jun-1980, 39 y.o.   MRN: [id number]  Hypertension This is a chronic problem. The current episode started more than 1 year ago. Associated symptoms include headaches. Pertinent negatives include no chest pain or shortness of breath. Risk factors for coronary artery disease include male gender. Treatments tried: HCTZ. There are no compliance problems.   Significant symptoms over the past several days and the past several months of headache also in addition this blood pressure significantly elevated patient denies any strong family history of high blood pressure he is overweight but he is able to lose some weight needs been watching his diet and trying to bring his weight down Also having trouble sleeping- currently on prednisone Chigger bites are doing much better  Review of Systems  Constitutional: Negative for activity change, appetite change and fatigue.  HENT: Negative for congestion and rhinorrhea.   Respiratory: Negative for cough and shortness of breath.   Cardiovascular: Negative for chest pain and leg swelling.  Gastrointestinal: Negative for abdominal pain, nausea and vomiting.  Neurological: Positive for headaches. Negative for dizziness.  Psychiatric/Behavioral: Negative for agitation and behavioral problems.       Objective:   Physical Exam Constitutional:      General: He is not in acute distress.    Appearance: He is well-developed.  HENT:     Head: Normocephalic.  Cardiovascular:     Rate and Rhythm: Normal rate and regular rhythm.     Heart sounds: Normal heart sounds. No murmur heard.   Pulmonary:     Effort: Pulmonary effort is normal.     Breath sounds: Normal breath sounds.  Skin:    General: Skin is warm and dry.  Neurological:     Mental Status: He is alert.  Psychiatric:        Behavior: Behavior normal.           Assessment & Plan:  Chigger bites are doing better stop the  prednisone Severely elevated blood pressure 185/98 Start amlodipine 5 mg daily Continue HCTZ daily Watch diet stay active try to bring weight down Follow-up in 4 weeks His wife is a nurse she will check blood pressure periodically and send [place] some readings over the next few weeks Lab work ordered await the results Covid vaccine encouraged

## 2019-10-15 NOTE — Patient Instructions (Signed)
DASH Eating Plan DASH stands for "Dietary Approaches to Stop Hypertension." The DASH eating plan is a healthy eating plan that has been shown to reduce high blood pressure (hypertension). It may also reduce your risk for type 2 diabetes, heart disease, and stroke. The DASH eating plan may also help with weight loss. What are tips for following this plan?  General guidelines  Avoid eating more than 2,300 mg (milligrams) of salt (sodium) a day. If you have hypertension, you may need to reduce your sodium intake to 1,500 mg a day.  Limit alcohol intake to no more than 1 drink a day for nonpregnant women and 2 drinks a day for men. One drink equals 12 oz of beer, 5 oz of wine, or 1 oz of hard liquor.  Work with your health care provider to maintain a healthy body weight or to lose weight. Ask what an ideal weight is for you.  Get at least 30 minutes of exercise that causes your heart to beat faster (aerobic exercise) most days of the week. Activities may include walking, swimming, or biking.  Work with your health care provider or diet and nutrition specialist (dietitian) to adjust your eating plan to your individual calorie needs. Reading food labels   Check food labels for the amount of sodium per serving. Choose foods with less than 5 percent of the Daily Value of sodium. Generally, foods with less than 300 mg of sodium per serving fit into this eating plan.  To find whole grains, look for the word "whole" as the first word in the ingredient list. Shopping  Buy products labeled as "low-sodium" or "no salt added."  Buy fresh foods. Avoid canned foods and premade or frozen meals. Cooking  Avoid adding salt when cooking. Use salt-free seasonings or herbs instead of table salt or sea salt. Check with your health care provider or pharmacist before using salt substitutes.  Do not fry foods. Cook foods using healthy methods such as baking, boiling, grilling, and broiling instead.  Cook with  heart-healthy oils, such as olive, canola, soybean, or sunflower oil. Meal planning  Eat a balanced diet that includes: ? 5 or more servings of fruits and vegetables each day. At each meal, try to fill half of your plate with fruits and vegetables. ? Up to 6-8 servings of whole grains each day. ? Less than 6 oz of lean meat, poultry, or fish each day. A 3-oz serving of meat is about the same size as a deck of cards. One egg equals 1 oz. ? 2 servings of low-fat dairy each day. ? A serving of nuts, seeds, or beans 5 times each week. ? Heart-healthy fats. Healthy fats called Omega-3 fatty acids are found in foods such as flaxseeds and coldwater fish, like sardines, salmon, and mackerel.  Limit how much you eat of the following: ? Canned or prepackaged foods. ? Food that is high in trans fat, such as fried foods. ? Food that is high in saturated fat, such as fatty meat. ? Sweets, desserts, sugary drinks, and other foods with added sugar. ? Full-fat dairy products.  Do not salt foods before eating.  Try to eat at least 2 vegetarian meals each week.  Eat more home-cooked food and less restaurant, buffet, and fast food.  When eating at a restaurant, ask that your food be prepared with less salt or no salt, if possible. What foods are recommended? The items listed may not be a complete list. Talk with your dietitian about   what dietary choices are best for you. Grains Whole-grain or whole-wheat bread. Whole-grain or whole-wheat pasta. Brown rice. Oatmeal. Quinoa. Bulgur. Whole-grain and low-sodium cereals. Pita bread. Low-fat, low-sodium crackers. Whole-wheat flour tortillas. Vegetables Fresh or frozen vegetables (raw, steamed, roasted, or grilled). Low-sodium or reduced-sodium tomato and vegetable juice. Low-sodium or reduced-sodium tomato sauce and tomato paste. Low-sodium or reduced-sodium canned vegetables. Fruits All fresh, dried, or frozen fruit. Canned fruit in natural juice (without  added sugar). Meat and other protein foods Skinless chicken or turkey. Ground chicken or turkey. Pork with fat trimmed off. Fish and seafood. Egg whites. Dried beans, peas, or lentils. Unsalted nuts, nut butters, and seeds. Unsalted canned beans. Lean cuts of beef with fat trimmed off. Low-sodium, lean deli meat. Dairy Low-fat (1%) or fat-free (skim) milk. Fat-free, low-fat, or reduced-fat cheeses. Nonfat, low-sodium ricotta or cottage cheese. Low-fat or nonfat yogurt. Low-fat, low-sodium cheese. Fats and oils Soft margarine without trans fats. Vegetable oil. Low-fat, reduced-fat, or light mayonnaise and salad dressings (reduced-sodium). Canola, safflower, olive, soybean, and sunflower oils. Avocado. Seasoning and other foods Herbs. Spices. Seasoning mixes without salt. Unsalted popcorn and pretzels. Fat-free sweets. What foods are not recommended? The items listed may not be a complete list. Talk with your dietitian about what dietary choices are best for you. Grains Baked goods made with fat, such as croissants, muffins, or some breads. Dry pasta or rice meal packs. Vegetables Creamed or fried vegetables. Vegetables in a cheese sauce. Regular canned vegetables (not low-sodium or reduced-sodium). Regular canned tomato sauce and paste (not low-sodium or reduced-sodium). Regular tomato and vegetable juice (not low-sodium or reduced-sodium). Pickles. Olives. Fruits Canned fruit in a light or heavy syrup. Fried fruit. Fruit in cream or butter sauce. Meat and other protein foods Fatty cuts of meat. Ribs. Fried meat. Bacon. Sausage. Bologna and other processed lunch meats. Salami. Fatback. Hotdogs. Bratwurst. Salted nuts and seeds. Canned beans with added salt. Canned or smoked fish. Whole eggs or egg yolks. Chicken or turkey with skin. Dairy Whole or 2% milk, cream, and half-and-half. Whole or full-fat cream cheese. Whole-fat or sweetened yogurt. Full-fat cheese. Nondairy creamers. Whipped toppings.  Processed cheese and cheese spreads. Fats and oils Butter. Stick margarine. Lard. Shortening. Ghee. Bacon fat. Tropical oils, such as coconut, palm kernel, or palm oil. Seasoning and other foods Salted popcorn and pretzels. Onion salt, garlic salt, seasoned salt, table salt, and sea salt. Worcestershire sauce. Tartar sauce. Barbecue sauce. Teriyaki sauce. Soy sauce, including reduced-sodium. Steak sauce. Canned and packaged gravies. Fish sauce. Oyster sauce. Cocktail sauce. Horseradish that you find on the shelf. Ketchup. Mustard. Meat flavorings and tenderizers. Bouillon cubes. Hot sauce and Tabasco sauce. Premade or packaged marinades. Premade or packaged taco seasonings. Relishes. Regular salad dressings. Where to find more information:  National Heart, Lung, and Blood Institute: www.nhlbi.nih.gov  American Heart Association: www.heart.org Summary  The DASH eating plan is a healthy eating plan that has been shown to reduce high blood pressure (hypertension). It may also reduce your risk for type 2 diabetes, heart disease, and stroke.  With the DASH eating plan, you should limit salt (sodium) intake to 2,300 mg a day. If you have hypertension, you may need to reduce your sodium intake to 1,500 mg a day.  When on the DASH eating plan, aim to eat more fresh fruits and vegetables, whole grains, lean proteins, low-fat dairy, and heart-healthy fats.  Work with your health care provider or diet and nutrition specialist (dietitian) to adjust your eating plan to your   individual calorie needs. This information is not intended to replace advice given to you by your health care provider. Make sure you discuss any questions you have with your health care provider. Document Revised: 12/10/2016 Document Reviewed: 12/22/2015 Elsevier Patient Education  2020 Elsevier Inc.  

## 2019-10-16 LAB — LIPID PANEL
Chol/HDL Ratio: 3.5 ratio (ref 0.0–5.0)
Cholesterol, Total: 198 mg/dL (ref 100–199)
HDL: 57 mg/dL (ref >39)
LDL Chol Calc (NIH): 126 mg/dL — ABNORMAL HIGH (ref 0–99)
Triglycerides: 84 mg/dL (ref 0–149)
VLDL Cholesterol Cal: 15 mg/dL (ref 5–40)

## 2019-10-16 LAB — COMPREHENSIVE METABOLIC PANEL
ALT: 30 IU/L (ref 0–44)
AST: 14 IU/L (ref 0–40)
Albumin/Globulin Ratio: 1.6 (ref 1.2–2.2)
Albumin: 4.6 g/dL (ref 4.0–5.0)
Alkaline Phosphatase: 94 IU/L (ref 44–121)
BUN/Creatinine Ratio: 24 — ABNORMAL HIGH (ref 9–20)
BUN: 21 mg/dL (ref 6–24)
Bilirubin Total: 0.4 mg/dL (ref 0.0–1.2)
CO2: 25 mmol/L (ref 20–29)
Calcium: 9.8 mg/dL (ref 8.7–10.2)
Chloride: 99 mmol/L (ref 96–106)
Creatinine, Ser: 0.89 mg/dL (ref 0.76–1.27)
GFR calc Af Amer: 124 mL/min/{1.73_m2} (ref >59)
GFR calc non Af Amer: 107 mL/min/{1.73_m2} (ref >59)
Globulin, Total: 2.9 g/dL (ref 1.5–4.5)
Glucose: 104 mg/dL — ABNORMAL HIGH (ref 65–99)
Potassium: 3.9 mmol/L (ref 3.5–5.2)
Sodium: 141 mmol/L (ref 134–144)
Total Protein: 7.5 g/dL (ref 6.0–8.5)

## 2019-11-01 ENCOUNTER — Other Ambulatory Visit: Payer: Self-pay | Admitting: Family Medicine

## 2019-11-01 DIAGNOSIS — F419 Anxiety disorder, unspecified: Secondary | ICD-10-CM

## 2019-11-12 ENCOUNTER — Other Ambulatory Visit (HOSPITAL_COMMUNITY): Payer: Self-pay | Admitting: Family Medicine

## 2019-11-12 ENCOUNTER — Ambulatory Visit (INDEPENDENT_AMBULATORY_CARE_PROVIDER_SITE_OTHER): Payer: No Typology Code available for payment source | Admitting: Family Medicine

## 2019-11-12 ENCOUNTER — Other Ambulatory Visit: Payer: Self-pay

## 2019-11-12 VITALS — BP 142/90 | Temp 98.1°F | Ht 69.0 in | Wt 265.8 lb

## 2019-11-12 DIAGNOSIS — I10 Essential (primary) hypertension: Secondary | ICD-10-CM | POA: Diagnosis not present

## 2019-11-12 MED ORDER — POTASSIUM CHLORIDE ER 10 MEQ PO TBCR: 10.0000 meq | EXTENDED_RELEASE_TABLET | Freq: Every day | ORAL | 1 refills | Status: DC

## 2019-11-12 NOTE — Patient Instructions (Signed)
DASH Eating Plan DASH stands for "Dietary Approaches to Stop Hypertension." The DASH eating plan is a healthy eating plan that has been shown to reduce high blood pressure (hypertension). It may also reduce your risk for type 2 diabetes, heart disease, and stroke. The DASH eating plan may also help with weight loss. What are tips for following this plan?  General guidelines  Avoid eating more than 2,300 mg (milligrams) of salt (sodium) a day. If you have hypertension, you may need to reduce your sodium intake to 1,500 mg a day.  Limit alcohol intake to no more than 1 drink a day for nonpregnant women and 2 drinks a day for men. One drink equals 12 oz of beer, 5 oz of wine, or 1 oz of hard liquor.  Work with your health care provider to maintain a healthy body weight or to lose weight. Ask what an ideal weight is for you.  Get at least 30 minutes of exercise that causes your heart to beat faster (aerobic exercise) most days of the week. Activities may include walking, swimming, or biking.  Work with your health care provider or diet and nutrition specialist (dietitian) to adjust your eating plan to your individual calorie needs. Reading food labels   Check food labels for the amount of sodium per serving. Choose foods with less than 5 percent of the Daily Value of sodium. Generally, foods with less than 300 mg of sodium per serving fit into this eating plan.  To find whole grains, look for the word "whole" as the first word in the ingredient list. Shopping  Buy products labeled as "low-sodium" or "no salt added."  Buy fresh foods. Avoid canned foods and premade or frozen meals. Cooking  Avoid adding salt when cooking. Use salt-free seasonings or herbs instead of table salt or sea salt. Check with your health care provider or pharmacist before using salt substitutes.  Do not fry foods. Cook foods using healthy methods such as baking, boiling, grilling, and broiling instead.  Cook with  heart-healthy oils, such as olive, canola, soybean, or sunflower oil. Meal planning  Eat a balanced diet that includes: ? 5 or more servings of fruits and vegetables each day. At each meal, try to fill half of your plate with fruits and vegetables. ? Up to 6-8 servings of whole grains each day. ? Less than 6 oz of lean meat, poultry, or fish each day. A 3-oz serving of meat is about the same size as a deck of cards. One egg equals 1 oz. ? 2 servings of low-fat dairy each day. ? A serving of nuts, seeds, or beans 5 times each week. ? Heart-healthy fats. Healthy fats called Omega-3 fatty acids are found in foods such as flaxseeds and coldwater fish, like sardines, salmon, and mackerel.  Limit how much you eat of the following: ? Canned or prepackaged foods. ? Food that is high in trans fat, such as fried foods. ? Food that is high in saturated fat, such as fatty meat. ? Sweets, desserts, sugary drinks, and other foods with added sugar. ? Full-fat dairy products.  Do not salt foods before eating.  Try to eat at least 2 vegetarian meals each week.  Eat more home-cooked food and less restaurant, buffet, and fast food.  When eating at a restaurant, ask that your food be prepared with less salt or no salt, if possible. What foods are recommended? The items listed may not be a complete list. Talk with your dietitian about   what dietary choices are best for you. Grains Whole-grain or whole-wheat bread. Whole-grain or whole-wheat pasta. Brown rice. Oatmeal. Quinoa. Bulgur. Whole-grain and low-sodium cereals. Pita bread. Low-fat, low-sodium crackers. Whole-wheat flour tortillas. Vegetables Fresh or frozen vegetables (raw, steamed, roasted, or grilled). Low-sodium or reduced-sodium tomato and vegetable juice. Low-sodium or reduced-sodium tomato sauce and tomato paste. Low-sodium or reduced-sodium canned vegetables. Fruits All fresh, dried, or frozen fruit. Canned fruit in natural juice (without  added sugar). Meat and other protein foods Skinless chicken or turkey. Ground chicken or turkey. Pork with fat trimmed off. Fish and seafood. Egg whites. Dried beans, peas, or lentils. Unsalted nuts, nut butters, and seeds. Unsalted canned beans. Lean cuts of beef with fat trimmed off. Low-sodium, lean deli meat. Dairy Low-fat (1%) or fat-free (skim) milk. Fat-free, low-fat, or reduced-fat cheeses. Nonfat, low-sodium ricotta or cottage cheese. Low-fat or nonfat yogurt. Low-fat, low-sodium cheese. Fats and oils Soft margarine without trans fats. Vegetable oil. Low-fat, reduced-fat, or light mayonnaise and salad dressings (reduced-sodium). Canola, safflower, olive, soybean, and sunflower oils. Avocado. Seasoning and other foods Herbs. Spices. Seasoning mixes without salt. Unsalted popcorn and pretzels. Fat-free sweets. What foods are not recommended? The items listed may not be a complete list. Talk with your dietitian about what dietary choices are best for you. Grains Baked goods made with fat, such as croissants, muffins, or some breads. Dry pasta or rice meal packs. Vegetables Creamed or fried vegetables. Vegetables in a cheese sauce. Regular canned vegetables (not low-sodium or reduced-sodium). Regular canned tomato sauce and paste (not low-sodium or reduced-sodium). Regular tomato and vegetable juice (not low-sodium or reduced-sodium). Pickles. Olives. Fruits Canned fruit in a light or heavy syrup. Fried fruit. Fruit in cream or butter sauce. Meat and other protein foods Fatty cuts of meat. Ribs. Fried meat. Bacon. Sausage. Bologna and other processed lunch meats. Salami. Fatback. Hotdogs. Bratwurst. Salted nuts and seeds. Canned beans with added salt. Canned or smoked fish. Whole eggs or egg yolks. Chicken or turkey with skin. Dairy Whole or 2% milk, cream, and half-and-half. Whole or full-fat cream cheese. Whole-fat or sweetened yogurt. Full-fat cheese. Nondairy creamers. Whipped toppings.  Processed cheese and cheese spreads. Fats and oils Butter. Stick margarine. Lard. Shortening. Ghee. Bacon fat. Tropical oils, such as coconut, palm kernel, or palm oil. Seasoning and other foods Salted popcorn and pretzels. Onion salt, garlic salt, seasoned salt, table salt, and sea salt. Worcestershire sauce. Tartar sauce. Barbecue sauce. Teriyaki sauce. Soy sauce, including reduced-sodium. Steak sauce. Canned and packaged gravies. Fish sauce. Oyster sauce. Cocktail sauce. Horseradish that you find on the shelf. Ketchup. Mustard. Meat flavorings and tenderizers. Bouillon cubes. Hot sauce and Tabasco sauce. Premade or packaged marinades. Premade or packaged taco seasonings. Relishes. Regular salad dressings. Where to find more information:  National Heart, Lung, and Blood Institute: www.nhlbi.nih.gov  American Heart Association: www.heart.org Summary  The DASH eating plan is a healthy eating plan that has been shown to reduce high blood pressure (hypertension). It may also reduce your risk for type 2 diabetes, heart disease, and stroke.  With the DASH eating plan, you should limit salt (sodium) intake to 2,300 mg a day. If you have hypertension, you may need to reduce your sodium intake to 1,500 mg a day.  When on the DASH eating plan, aim to eat more fresh fruits and vegetables, whole grains, lean proteins, low-fat dairy, and heart-healthy fats.  Work with your health care provider or diet and nutrition specialist (dietitian) to adjust your eating plan to your   individual calorie needs. This information is not intended to replace advice given to you by your health care provider. Make sure you discuss any questions you have with your health care provider. Document Revised: 12/10/2016 Document Reviewed: 12/22/2015 Elsevier Patient Education  2020 Elsevier Inc.  

## 2019-11-12 NOTE — Progress Notes (Signed)
   Subjective:    Patient ID: [name] West, male    DOB: 1980-04-05, 39 y.o.   MRN: [id number]  Hypertension This is a chronic problem. The current episode started more than 1 year ago. Pertinent negatives include no chest pain, headaches or shortness of breath. Risk factors for coronary artery disease include male gender. Treatments tried: norvasc, HCTZ. There are no compliance problems.    Patient does try to be careful with diet trying to do a little more walking doing a lot of hunting his wife is the nurses checked his blood pressure 136/86   Review of Systems  Constitutional: Negative for activity change, fatigue and fever.  HENT: Negative for congestion and rhinorrhea.   Respiratory: Negative for cough and shortness of breath.   Cardiovascular: Negative for chest pain and leg swelling.  Gastrointestinal: Negative for abdominal pain, diarrhea and nausea.  Genitourinary: Negative for dysuria and hematuria.  Neurological: Negative for weakness and headaches.  Psychiatric/Behavioral: Negative for agitation and behavioral problems.       Objective:   Physical Exam Vitals reviewed.  Cardiovascular:     Rate and Rhythm: Normal rate and regular rhythm.     Heart sounds: Normal heart sounds. No murmur heard.   Pulmonary:     Effort: Pulmonary effort is normal.     Breath sounds: Normal breath sounds.  Lymphadenopathy:     Cervical: No cervical adenopathy.  Neurological:     Mental Status: He is alert.  Psychiatric:        Behavior: Behavior normal.           Assessment & Plan:  Large cuff best reading 136/88 followed up by 132/86 Continue current medications Watch diet Watch portions Walk on a regular basis to lose weight Have his wife check blood pressures relatively frequently send [place] some readings then progress forward with follow-up in 6 months Send [place] some readings if these are not doing well we will need to bump up the amlodipine new dose would be 10 mg if blood  pressure readings stay high

## 2019-11-30 ENCOUNTER — Encounter: Payer: No Typology Code available for payment source | Admitting: Family Medicine

## 2019-12-24 ENCOUNTER — Telehealth: Payer: Self-pay

## 2019-12-24 MED FILL — POTASSIUM CHLORIDE CRYS ER: 10 | 90 days supply | Qty: 90 | Fill #0

## 2019-12-24 MED FILL — HYDROCHLOROTHIAZIDE 25 MG T: 25 | 90 days supply | Qty: 90 | Fill #0

## 2019-12-24 MED FILL — AMLODIPINE BESYLATE 5 MG TA: 5 | 90 days supply | Qty: 90 | Fill #0

## 2019-12-25 ENCOUNTER — Other Ambulatory Visit (HOSPITAL_COMMUNITY): Payer: Self-pay | Admitting: Family Medicine

## 2019-12-25 ENCOUNTER — Other Ambulatory Visit: Payer: Self-pay | Admitting: *Deleted

## 2019-12-25 DIAGNOSIS — F419 Anxiety disorder, unspecified: Secondary | ICD-10-CM

## 2019-12-25 MED ORDER — SERTRALINE HCL 50 MG PO TABS: 50.0000 mg | ORAL_TABLET | Freq: Every day | ORAL | 4 refills | Status: DC

## 2019-12-25 MED FILL — SERTRALINE HCL 50 MG TABLET: 50 | 30 days supply | Qty: 30 | Fill #0

## 2019-12-25 NOTE — Telephone Encounter (Signed)
Refill sent to [facility].

## 2019-12-25 NOTE — Telephone Encounter (Signed)
   CW            Mali A. Mcclune Male, 39 y.o., 08-25-1980  MRN:  [id number] Phone:  [phone] Jerilynn Mages)       PCP:  Kathyrn Drown, MD Primary Cvg:  [employer]  Next Appt With Family Medicine Sallee Lange, MD) 05/12/2020 at 8:40 AM             To Close This Visit  Required Items  No additional encounter notes found.      Advice Only (Patient is requesting refill sertraline 50 mg no recent medication follow up last filled 11/02/19. He would like to change pharmacy to [facility])  [place], [place] A [phone]  You Yesterday (3:24 PM)       Questionnaires  No completed forms available for this encounter.

## 2019-12-27 ENCOUNTER — Other Ambulatory Visit: Payer: Self-pay

## 2019-12-27 ENCOUNTER — Ambulatory Visit
Admission: EM | Admit: 2019-12-27 | Discharge: 2019-12-27 | Disposition: A | Discharge status: Home / Self Care | Payer: No Typology Code available for payment source | Attending: Internal Medicine | Admitting: Internal Medicine

## 2019-12-27 DIAGNOSIS — R6889 Other general symptoms and signs: Secondary | ICD-10-CM

## 2019-12-27 DIAGNOSIS — Z1152 Encounter for screening for COVID-19: Secondary | ICD-10-CM

## 2019-12-27 MED ORDER — IBUPROFEN 600 MG PO TABS: 600.0000 mg | ORAL_TABLET | Freq: Four times a day (QID) | ORAL | 0 refills | Status: DC | PRN

## 2019-12-27 MED ORDER — BENZONATATE 100 MG PO CAPS: 100.0000 mg | ORAL_CAPSULE | Freq: Three times a day (TID) | ORAL | 0 refills | Status: DC

## 2019-12-27 MED ORDER — ONDANSETRON 4 MG PO TBDP: 4.0000 mg | ORAL_TABLET | Freq: Three times a day (TID) | ORAL | 0 refills | Status: DC | PRN

## 2019-12-27 NOTE — Discharge Instructions (Signed)
Increase oral fluid intake Take medications as directed We will call you with your lab results if abnormal Please quarantine until COVID-19 test results are available.

## 2019-12-27 NOTE — ED Triage Notes (Signed)
Pt presents with c/o headache , cough and chills , positive covid exposure

## 2019-12-27 NOTE — ED Provider Notes (Signed)
[facility]    CSN: [id number] Arrival date & time: 12/27/19  1845      History   Chief Complaint Chief Complaint  Patient presents with  . Headache  . Cough    HPI [name] West is a 39 y.o. male comes to the urgent care with a couple of days history of global, pounding headache, cough, nausea, subjective fever and chills.  Patient was exposed to a Covid positive individual.  He has no diarrhea.  He is not vaccinated against COVID-19 virus...   HPI  Past Medical History:  Diagnosis Date  . Anxiety   . Gout   . HNP (herniated nucleus pulposus), lumbar   . HTN (hypertension)   . Hx of low back pain   . IBS (irritable bowel syndrome)   . Joint pain   . Kidney problem   . Kidney stones   . Leg edema   . Sleep apnea     Patient Active Problem List   Diagnosis Date Noted  . Herniated lumbar disc without myelopathy 02/21/2018  . Absolute anemia 02/07/2018  . Essential hypertension 02/07/2018  . Other hyperlipidemia 02/07/2018  . Vitamin D deficiency 12/29/2017  . Prediabetes 12/29/2017  . Class 3 severe obesity with serious comorbidity and body mass index (BMI) of 40.0 to 44.9 in adult ([place]) 12/29/2017  . Sleep apnea 12/22/2016  . Obesity (BMI 35.0-39.9 without comorbidity) 05/07/2016  . History of kidney stones 11/07/2015  . Anxiety as acute reaction to exceptional stress 09/14/2014    Past Surgical History:  Procedure Laterality Date  . BACK SURGERY    . KIDNEY STONE SURGERY    . LUMBAR LAMINECTOMY/DECOMPRESSION MICRODISCECTOMY Right 02/21/2018   Procedure: Right Lumbar Three-Four Lumbar Four-Five Microdiscectomy;  Surgeon: Erline Levine, MD;  Location: [facility];  Service: Neurosurgery;  Laterality: Right;  Right Lumbar Three-Four Lumbar Four-Five Microdiscectomy       Home Medications    Prior to Admission medications   Medication Sig Start Date End Date Taking? Authorizing Provider  amLODipine (NORVASC) 5 MG tablet Take 1 tablet (5 mg total)  by mouth daily. 10/15/19   Kathyrn Drown, MD  benzonatate (TESSALON) 100 MG capsule Take 1 capsule (100 mg total) by mouth every 8 (eight) hours. 12/27/19   Marzell Isakson, Myrene Galas, MD  hydrochlorothiazide (HYDRODIURIL) 25 MG tablet TAKE 1 TABLET DAILY FOR FLUID AND BLOOD PRESSURE. 10/15/19   Kathyrn Drown, MD  ibuprofen (ADVIL) 600 MG tablet Take 1 tablet (600 mg total) by mouth every 6 (six) hours as needed. 12/27/19   Ugo Thoma, Myrene Galas, MD  methocarbamol (ROBAXIN) 500 MG tablet Take 1 tablet (500 mg total) by mouth every 6 (six) hours as needed for muscle spasms. 02/22/18   Erline Levine, MD  ondansetron (ZOFRAN ODT) 4 MG disintegrating tablet Take 1 tablet (4 mg total) by mouth every 8 (eight) hours as needed for nausea or vomiting. 12/27/19   Kynlee Koenigsberg, Myrene Galas, MD  potassium chloride (KLOR-CON) 10 MEQ tablet Take 1 tablet (10 mEq total) by mouth daily. 11/12/19   Kathyrn Drown, MD  sertraline (ZOLOFT) 50 MG tablet Take 1 tablet (50 mg total) by mouth daily. 12/25/19   Kathyrn Drown, MD  tamsulosin (FLOMAX) 0.4 MG CAPS capsule Take 1 capsule (0.4 mg total) by mouth daily as needed (kidney stone). 04/03/19   Kathyrn Drown, MD    Family History Family History  Problem Relation Age of Onset  . Cancer Mother  Lung  . Hyperlipidemia Father   . Heart disease Father   . Kidney Stones Father   . Heart disease Paternal Grandfather 63       died of MI    Social History Social History   Tobacco Use  . Smoking status: Never Smoker  . Smokeless tobacco: Never Used  Vaping Use  . Vaping Use: Never used  Substance Use Topics  . Alcohol use: Yes    Comment: occas social  . Drug use: No     Allergies   Patient has no known allergies.   Review of Systems Review of Systems  Constitutional: Positive for chills, fatigue and fever.  HENT: Positive for congestion and sore throat.   Respiratory: Positive for cough.   Gastrointestinal: Positive for nausea. Negative for diarrhea and  vomiting.     Physical Exam Triage Vital Signs ED Triage Vitals  Enc Vitals Group     BP 12/27/19 1920 (!) 137/94     Pulse Rate 12/27/19 1920 68     Resp 12/27/19 1920 20     Temp 12/27/19 1920 99.2 F (37.3 C)     Temp src --      SpO2 12/27/19 1920 96 %     Weight 12/27/19 1921 256 lb (116.1 kg)     Height 12/27/19 1921 5\' 9"  (1.753 m)     Head Circumference --      Peak Flow --      Pain Score 12/27/19 1918 10     Pain Loc --      Pain Edu? --      Excl. in [place]? --    No data found.  Updated Vital Signs BP (!) 137/94   Pulse 68   Temp 99.2 F (37.3 C)   Resp 20   Ht 5\' 9"  (1.753 m)   Wt 116.1 kg   SpO2 96%   BMI 37.80 kg/m   Visual Acuity Right Eye Distance:   Left Eye Distance:   Bilateral Distance:    Right Eye Near:   Left Eye Near:    Bilateral Near:     Physical Exam Vitals and nursing note reviewed.  Constitutional:      General: He is not in acute distress.    Appearance: He is ill-appearing.  Eyes:     Extraocular Movements: Extraocular movements intact.  Cardiovascular:     Rate and Rhythm: Normal rate and regular rhythm.     Heart sounds: Normal heart sounds.  Pulmonary:     Effort: Pulmonary effort is normal.  Abdominal:     General: Bowel sounds are normal.     Palpations: Abdomen is soft.  Neurological:     Mental Status: He is alert.      UC Treatments / Results  Labs (all labs ordered are listed, but only abnormal results are displayed) Labs Reviewed  COVID-19, FLU A+B NAA    EKG   Radiology No results found.  Procedures Procedures (including critical care time)  Medications Ordered in UC Medications - No data to display  Initial Impression / Assessment and Plan / UC Course  I have reviewed the triage vital signs and the nursing notes.  Pertinent labs & imaging results that were available during my care of the patient were reviewed by me and considered in my medical decision making (see chart for details).      1.  Flulike illness likely COVID-19 infection. Tylenol/Motrin as needed for fever and/or chills Zofran as needed  for nausea Tessalon Perles as needed for cough COVID-19/flu AMB PCR sent Patient is advised to hydrate We will call the patient with recommendations if the tests are abnormal. Final Clinical Impressions(s) / UC Diagnoses   Final diagnoses:  Flu-like symptoms     Discharge Instructions     Increase oral fluid intake Take medications as directed We will call you with your lab results if abnormal Please quarantine until COVID-19 test results are available.   ED Prescriptions    Medication Sig Dispense Auth. Provider   ibuprofen (ADVIL) 600 MG tablet Take 1 tablet (600 mg total) by mouth every 6 (six) hours as needed. 30 tablet Joclynn Lumb, Myrene Galas, MD   ondansetron (ZOFRAN ODT) 4 MG disintegrating tablet Take 1 tablet (4 mg total) by mouth every 8 (eight) hours as needed for nausea or vomiting. 20 tablet Lilliana Turner, Myrene Galas, MD   benzonatate (TESSALON) 100 MG capsule Take 1 capsule (100 mg total) by mouth every 8 (eight) hours. 21 capsule Aerial Dilley, Myrene Galas, MD     PDMP not reviewed this encounter.   Chase Picket, MD 12/27/19 2004

## 2019-12-28 ENCOUNTER — Telehealth: Payer: Self-pay | Admitting: *Deleted

## 2019-12-28 ENCOUNTER — Encounter: Payer: Self-pay | Admitting: *Deleted

## 2019-12-28 LAB — COVID-19, FLU A+B NAA
Influenza A, NAA: NOT DETECTED
Influenza B, NAA: NOT DETECTED
SARS-CoV-2, NAA: DETECTED — AB

## 2019-12-28 NOTE — Telephone Encounter (Signed)
Tried calling diane's number multiple times and she could not hear [place]. Called pt and left on his voicemail to check mychart message and copy of message was sent to his mychart.

## 2019-12-28 NOTE — Telephone Encounter (Signed)
Pt's wife called and wanted to know if he would be qualify for monoclonal antibody test. States she took him to urgent care last night and got a call today that his covid test was positive. She does not feel he needs to be seen. No sob, no chest pain. He is having shoulder pain but she thinks that is from a pulled muscle. He is coughing and has fever. Highest today 101.5. his symptoms started 2 days ago. Also wife wants to know if rest of family should be tested since they are not having any symptoms.   [phone]

## 2019-12-28 NOTE — Telephone Encounter (Signed)
1.  Please go ahead and refer him to the monoclonal antibody infusion hotline because of his obesity Please call [phone] give the proper information for them to connect with this patient  The rest of the family should consider testing 5 to 7 days after his positive test  Warning signs on what to know-if shortness of breath with minimal activity severe chest pains passing out spells or feeling like he cannot breathe that would be indication to go to the ER  Please let Diane know that we are referring to the monoclonal antibody infusion clinic and someone typically will connect with them within 24 to 48 hours  If for some reason phone number does not work you may send it to the email [email or web address] Must include name, date of birth, when the positive test occurred, when symptoms began, also include patient has morbid obesity thank you

## 2019-12-28 NOTE — Telephone Encounter (Signed)
Referred to the monoclonal antibody infusion center

## 2019-12-29 ENCOUNTER — Other Ambulatory Visit: Payer: Self-pay | Admitting: Physician Assistant

## 2019-12-29 DIAGNOSIS — U071 COVID-19: Secondary | ICD-10-CM

## 2019-12-29 DIAGNOSIS — E669 Obesity, unspecified: Secondary | ICD-10-CM

## 2019-12-29 DIAGNOSIS — I10 Essential (primary) hypertension: Secondary | ICD-10-CM

## 2019-12-29 NOTE — Progress Notes (Signed)
I connected by phone with [name] West on 12/29/2019 at 3:18 PM to discuss the potential use of a new treatment for mild to moderate COVID-19 viral infection in non-hospitalized patients.  This patient is a 40 y.o. male that meets the FDA criteria for Emergency Use Authorization of COVID monoclonal antibody casirivimab/imdevimab, bamlanivimab/etesevimab, or sotrovimab.  Has a (+) direct SARS-CoV-2 viral test result  Has mild or moderate COVID-19   Is NOT hospitalized due to COVID-19  Is within 10 days of symptom onset  Has at least one of the high risk factor(s) for progression to severe COVID-19 and/or hospitalization as defined in EUA.  Specific high risk criteria : BMI > 25 and Cardiovascular disease or hypertension   I have spoken and communicated the following to the patient or parent/caregiver regarding COVID monoclonal antibody treatment:  1. FDA has authorized the emergency use for the treatment of mild to moderate COVID-19 in adults and pediatric patients with positive results of direct SARS-CoV-2 viral testing who are 34 years of age and older weighing at least 40 kg, and who are at high risk for progressing to severe COVID-19 and/or hospitalization.  2. The significant known and potential risks and benefits of COVID monoclonal antibody, and the extent to which such potential risks and benefits are unknown.  3. Information on available alternative treatments and the risks and benefits of those alternatives, including clinical trials.  4. Patients treated with COVID monoclonal antibody should continue to self-isolate and use infection control measures (e.g., wear mask, isolate, social distance, avoid sharing personal items, clean and disinfect "high touch" surfaces, and frequent handwashing) according to CDC guidelines.   5. The patient or parent/caregiver has the option to accept or refuse COVID monoclonal antibody treatment.  After reviewing this information with the patient,  the patient has agreed to receive one of the available covid 19 monoclonal antibodies and will be provided an appropriate fact sheet prior to infusion. [name] [name], [place] 12/29/2019 3:18 PM

## 2019-12-31 ENCOUNTER — Ambulatory Visit (HOSPITAL_COMMUNITY): Payer: No Typology Code available for payment source

## 2020-03-31 ENCOUNTER — Other Ambulatory Visit: Payer: Self-pay | Admitting: Family Medicine

## 2020-03-31 ENCOUNTER — Other Ambulatory Visit: Payer: Self-pay | Admitting: *Deleted

## 2020-03-31 ENCOUNTER — Other Ambulatory Visit (HOSPITAL_COMMUNITY): Payer: Self-pay | Admitting: Family Medicine

## 2020-03-31 DIAGNOSIS — F419 Anxiety disorder, unspecified: Secondary | ICD-10-CM

## 2020-03-31 DIAGNOSIS — I10 Essential (primary) hypertension: Secondary | ICD-10-CM

## 2020-03-31 MED ORDER — AMLODIPINE BESYLATE 5 MG PO TABS: 5.0000 mg | ORAL_TABLET | Freq: Every day | ORAL | 0 refills | Status: DC

## 2020-03-31 MED ORDER — HYDROCHLOROTHIAZIDE 25 MG PO TABS: ORAL_TABLET | ORAL | 0 refills | Status: DC

## 2020-03-31 MED ORDER — POTASSIUM CHLORIDE ER 10 MEQ PO TBCR: 10.0000 meq | EXTENDED_RELEASE_TABLET | Freq: Every day | ORAL | 0 refills | Status: DC

## 2020-03-31 MED FILL — HYDROCHLOROTHIAZIDE 25 MG T: 25 | 90 days supply | Qty: 90 | Fill #0

## 2020-03-31 MED FILL — POTASSIUM CL ER 10 MEQ TAB: 10 | 90 days supply | Qty: 90 | Fill #0

## 2020-03-31 MED FILL — AMLODIPINE BESYLATE 5 MG TA: 5 | 90 days supply | Qty: 90 | Fill #0

## 2020-03-31 MED FILL — SERTRALINE HCL 50 MG TABS: 50 | 90 days supply | Qty: 90 | Fill #0

## 2020-03-31 NOTE — Telephone Encounter (Signed)
Last seen for check up in nov and has up coming appt in may

## 2020-04-03 ENCOUNTER — Telehealth: Payer: Self-pay | Admitting: *Deleted

## 2020-04-03 ENCOUNTER — Other Ambulatory Visit (HOSPITAL_BASED_OUTPATIENT_CLINIC_OR_DEPARTMENT_OTHER): Payer: Self-pay

## 2020-04-03 ENCOUNTER — Other Ambulatory Visit (HOSPITAL_COMMUNITY): Payer: Self-pay | Admitting: Family Medicine

## 2020-04-03 ENCOUNTER — Other Ambulatory Visit: Payer: Self-pay | Admitting: *Deleted

## 2020-04-03 MED ORDER — POTASSIUM CHLORIDE CRYS ER 10 MEQ PO TBCR: 10.0000 meq | EXTENDED_RELEASE_TABLET | Freq: Every day | ORAL | 1 refills | Status: DC

## 2020-04-03 MED FILL — POTASSIUM CHLORIDE CRYS ER: 10 | 90 days supply | Qty: 90 | Fill #0

## 2020-04-16 NOTE — Telephone Encounter (Signed)
error 

## 2020-04-17 ENCOUNTER — Other Ambulatory Visit (HOSPITAL_COMMUNITY): Payer: Self-pay

## 2020-05-04 ENCOUNTER — Encounter: Payer: Self-pay | Admitting: Family Medicine

## 2020-05-05 ENCOUNTER — Other Ambulatory Visit (HOSPITAL_BASED_OUTPATIENT_CLINIC_OR_DEPARTMENT_OTHER): Payer: Self-pay

## 2020-05-05 ENCOUNTER — Other Ambulatory Visit (HOSPITAL_COMMUNITY): Payer: Self-pay

## 2020-05-05 MED ORDER — VALSARTAN 80 MG PO TABS
80.0000 mg | ORAL_TABLET | Freq: Every day | ORAL | 0 refills | Status: DC
  Filled 2020-05-05: qty 30, 30d supply, fill #0

## 2020-05-05 NOTE — Telephone Encounter (Signed)
Nurses  I reviewed over the blood pressures they certainly are elevated  Please add valsartan 80 mg 1 daily, #30.  Continue the other medicines.  Keep follow-up in early May for further discussion and recheck  Thanks-Dr. Nicki Reaper

## 2020-05-05 NOTE — Addendum Note (Signed)
Addended by: Dairl Ponder on: 05/05/2020 02:00 PM   Modules accepted: Orders

## 2020-05-12 ENCOUNTER — Other Ambulatory Visit: Payer: Self-pay

## 2020-05-12 ENCOUNTER — Ambulatory Visit (INDEPENDENT_AMBULATORY_CARE_PROVIDER_SITE_OTHER): Payer: 59 | Admitting: Family Medicine

## 2020-05-12 ENCOUNTER — Other Ambulatory Visit (HOSPITAL_COMMUNITY): Payer: Self-pay

## 2020-05-12 ENCOUNTER — Encounter: Payer: Self-pay | Admitting: Family Medicine

## 2020-05-12 VITALS — BP 156/89 | HR 47 | Temp 97.4°F | Ht 69.0 in | Wt 276.0 lb

## 2020-05-12 DIAGNOSIS — Z1159 Encounter for screening for other viral diseases: Secondary | ICD-10-CM | POA: Diagnosis not present

## 2020-05-12 DIAGNOSIS — R7303 Prediabetes: Secondary | ICD-10-CM

## 2020-05-12 DIAGNOSIS — Z114 Encounter for screening for human immunodeficiency virus [HIV]: Secondary | ICD-10-CM | POA: Diagnosis not present

## 2020-05-12 DIAGNOSIS — I10 Essential (primary) hypertension: Secondary | ICD-10-CM

## 2020-05-12 DIAGNOSIS — R5383 Other fatigue: Secondary | ICD-10-CM

## 2020-05-12 DIAGNOSIS — E7849 Other hyperlipidemia: Secondary | ICD-10-CM | POA: Diagnosis not present

## 2020-05-12 MED ORDER — SERTRALINE HCL 100 MG PO TABS
100.0000 mg | ORAL_TABLET | Freq: Every day | ORAL | 1 refills | Status: DC
  Filled 2020-05-12: qty 90, 90d supply, fill #0

## 2020-05-12 MED ORDER — VALSARTAN 160 MG PO TABS
160.0000 mg | ORAL_TABLET | Freq: Every day | ORAL | 1 refills | Status: DC
  Filled 2020-05-12: qty 90, 90d supply, fill #0

## 2020-05-12 MED ORDER — HYDROCHLOROTHIAZIDE 25 MG PO TABS
ORAL_TABLET | ORAL | 1 refills | Status: DC
  Filled 2020-05-12: qty 90, 90d supply, fill #0

## 2020-05-12 MED ORDER — POTASSIUM CHLORIDE CRYS ER 10 MEQ PO TBCR
10.0000 meq | EXTENDED_RELEASE_TABLET | Freq: Every day | ORAL | 1 refills | Status: DC
  Filled 2020-05-12: qty 90, 90d supply, fill #0

## 2020-05-12 NOTE — Progress Notes (Signed)
Subjective:    Patient ID: [name] West, male    DOB: December 17, 1980, 40 y.o.   MRN: [id number]  HPI,med check up.  Valsartan was added last week. Pt states bp is still running high.  Blood pressure not under good control patient states he is trying to eat healthy very difficult time losing weight States zoloft is not helping.  Finds himself on edge a lot of stress finds himself feeling annoyed at times sometimes feels down or sad not suicidal Not suicidal Essential hypertension - Plan: Lipid panel, Comprehensive metabolic panel, TSH, T4, free, Hepatitis C antibody, Hemoglobin A1c, HIV Antibody (routine testing w rflx)  Other fatigue - Plan: Lipid panel, Comprehensive metabolic panel, TSH, T4, free, Hepatitis C antibody, Hemoglobin A1c, HIV Antibody (routine testing w rflx)  Other hyperlipidemia - Plan: Lipid panel, Comprehensive metabolic panel, TSH, T4, free, Hepatitis C antibody, Hemoglobin A1c, HIV Antibody (routine testing w rflx)  Prediabetes - Plan: Lipid panel, Comprehensive metabolic panel, TSH, T4, free, Hepatitis C antibody, Hemoglobin A1c, HIV Antibody (routine testing w rflx)  Screening for HIV (human immunodeficiency virus) - Plan: HIV Antibody (routine testing w rflx)  Encounter for hepatitis C screening test for low risk patient - Plan: Hepatitis C antibody  Morbid obesity ([place])  We did discuss weight in the importance of getting his weight under control   Review of Systems     Objective:   Physical Exam Vitals reviewed.  Constitutional:      General: He is not in acute distress. HENT:     Head: Normocephalic and atraumatic.  Eyes:     General:        Right eye: No discharge.        Left eye: No discharge.  Neck:     Trachea: No tracheal deviation.  Cardiovascular:     Rate and Rhythm: Normal rate and regular rhythm.     Heart sounds: Normal heart sounds. No murmur heard.   Pulmonary:     Effort: Pulmonary effort is normal. No respiratory distress.      Breath sounds: Normal breath sounds.  Lymphadenopathy:     Cervical: No cervical adenopathy.  Skin:    General: Skin is warm and dry.  Neurological:     Mental Status: He is alert.     Coordination: Coordination normal.  Psychiatric:        Behavior: Behavior normal.           Assessment & Plan:  1. Essential hypertension Subpar control bump up dose of medicine watch diet stay active recheck again in 4 weeks - Lipid panel - Comprehensive metabolic panel - TSH - T4, free - Hepatitis C antibody - Hemoglobin A1c - HIV Antibody (routine testing w rflx)  2. Other fatigue Lab work ordered.  Bump up dose of Zoloft to 100 mg daily. - Lipid panel - Comprehensive metabolic panel - TSH - T4, free - Hepatitis C antibody - Hemoglobin A1c - HIV Antibody (routine testing w rflx)  3. Other hyperlipidemia Stick with healthy diet regular physical activity check labs await results - Lipid panel - Comprehensive metabolic panel - TSH - T4, free - Hepatitis C antibody - Hemoglobin A1c - HIV Antibody (routine testing w rflx)  4. Prediabetes Minimize carbohydrates exercise consider weight loss surgery check labs await results - Lipid panel - Comprehensive metabolic panel - TSH - T4, free - Hepatitis C antibody - Hemoglobin A1c - HIV Antibody (routine testing w rflx)  5. Screening for HIV (human  immunodeficiency virus) Screening - HIV Antibody (routine testing w rflx)  6. Encounter for hepatitis C screening test for low risk patient Screening - Hepatitis C antibody  7. Morbid obesity ([place]) Portion control regular activity very difficult for this patient to lose weight long-term this could be significantly negative for his health therefore it is important for him to consider significant weight loss through either medications or surgery follow-up in 1 month to recheck the above issues

## 2020-06-03 ENCOUNTER — Ambulatory Visit: Payer: 59 | Admitting: Family Medicine

## 2020-06-03 ENCOUNTER — Encounter: Payer: Self-pay | Admitting: Family Medicine

## 2020-06-03 DIAGNOSIS — Z114 Encounter for screening for human immunodeficiency virus [HIV]: Secondary | ICD-10-CM | POA: Diagnosis not present

## 2020-06-03 DIAGNOSIS — Z1159 Encounter for screening for other viral diseases: Secondary | ICD-10-CM | POA: Diagnosis not present

## 2020-06-03 DIAGNOSIS — E7849 Other hyperlipidemia: Secondary | ICD-10-CM | POA: Diagnosis not present

## 2020-06-03 DIAGNOSIS — R7303 Prediabetes: Secondary | ICD-10-CM | POA: Diagnosis not present

## 2020-06-03 DIAGNOSIS — I10 Essential (primary) hypertension: Secondary | ICD-10-CM | POA: Diagnosis not present

## 2020-06-03 DIAGNOSIS — R5383 Other fatigue: Secondary | ICD-10-CM | POA: Diagnosis not present

## 2020-06-05 LAB — HEPATITIS C ANTIBODY: Hep C Virus Ab: <0.1 s/co ratio (ref 0.0–0.9)

## 2020-06-05 LAB — COMPREHENSIVE METABOLIC PANEL
ALT: 25 IU/L (ref 0–44)
AST: 18 IU/L (ref 0–40)
Albumin/Globulin Ratio: 1.7 (ref 1.2–2.2)
Albumin: 4.1 g/dL (ref 4.0–5.0)
Alkaline Phosphatase: 101 IU/L (ref 44–121)
BUN/Creatinine Ratio: 20 (ref 9–20)
BUN: 16 mg/dL (ref 6–24)
Bilirubin Total: 0.3 mg/dL (ref 0.0–1.2)
CO2: 22 mmol/L (ref 20–29)
Calcium: 9 mg/dL (ref 8.7–10.2)
Chloride: 103 mmol/L (ref 96–106)
Creatinine, Ser: 0.81 mg/dL (ref 0.76–1.27)
Globulin, Total: 2.4 g/dL (ref 1.5–4.5)
Glucose: 93 mg/dL (ref 65–99)
Potassium: 4.2 mmol/L (ref 3.5–5.2)
Sodium: 140 mmol/L (ref 134–144)
Total Protein: 6.5 g/dL (ref 6.0–8.5)
eGFR: 114 mL/min/{1.73_m2} (ref >59)

## 2020-06-05 LAB — LIPID PANEL
Chol/HDL Ratio: 4.2 ratio (ref 0.0–5.0)
Cholesterol, Total: 173 mg/dL (ref 100–199)
HDL: 41 mg/dL (ref >39)
LDL Chol Calc (NIH): 120 mg/dL — ABNORMAL HIGH (ref 0–99)
Triglycerides: 64 mg/dL (ref 0–149)
VLDL Cholesterol Cal: 12 mg/dL (ref 5–40)

## 2020-06-05 LAB — TSH: TSH: 2.36 u[IU]/mL (ref 0.450–4.500)

## 2020-06-05 LAB — HEMOGLOBIN A1C
Est. average glucose Bld gHb Est-mCnc: 111 mg/dL
Hgb A1c MFr Bld: 5.5 % (ref 4.8–5.6)

## 2020-06-05 LAB — HIV ANTIBODY (ROUTINE TESTING W REFLEX): HIV Screen 4th Generation wRfx: NONREACTIVE

## 2020-06-05 LAB — T4, FREE: Free T4: 1.2 ng/dL (ref 0.82–1.77)

## 2020-06-10 ENCOUNTER — Ambulatory Visit: Payer: 59 | Admitting: Family Medicine

## 2020-06-10 ENCOUNTER — Other Ambulatory Visit: Payer: Self-pay

## 2020-06-10 VITALS — BP 130/82 | HR 60 | Temp 97.3°F | Ht 69.0 in | Wt 271.4 lb

## 2020-06-10 DIAGNOSIS — I10 Essential (primary) hypertension: Secondary | ICD-10-CM | POA: Diagnosis not present

## 2020-06-10 NOTE — Patient Instructions (Signed)
Results for orders placed or performed in visit on 05/12/20  Lipid panel  Result Value Ref Range   Cholesterol, Total 173 100 - 199 mg/dL   Triglycerides 64 0 - 149 mg/dL   HDL 41 >39 mg/dL   VLDL Cholesterol Cal 12 5 - 40 mg/dL   LDL Chol Calc (NIH) 120 (H) 0 - 99 mg/dL   Chol/HDL Ratio 4.2 0.0 - 5.0 ratio  Comprehensive metabolic panel  Result Value Ref Range   Glucose 93 65 - 99 mg/dL   BUN 16 6 - 24 mg/dL   Creatinine, Ser 0.81 0.76 - 1.27 mg/dL   eGFR 114 >59 mL/min/1.73   BUN/Creatinine Ratio 20 9 - 20   Sodium 140 134 - 144 mmol/L   Potassium 4.2 3.5 - 5.2 mmol/L   Chloride 103 96 - 106 mmol/L   CO2 22 20 - 29 mmol/L   Calcium 9.0 8.7 - 10.2 mg/dL   Total Protein 6.5 6.0 - 8.5 g/dL   Albumin 4.1 4.0 - 5.0 g/dL   Globulin, Total 2.4 1.5 - 4.5 g/dL   Albumin/Globulin Ratio 1.7 1.2 - 2.2   Bilirubin Total 0.3 0.0 - 1.2 mg/dL   Alkaline Phosphatase 101 44 - 121 IU/L   AST 18 0 - 40 IU/L   ALT 25 0 - 44 IU/L  TSH  Result Value Ref Range   TSH 2.360 0.450 - 4.500 uIU/mL  T4, free  Result Value Ref Range   Free T4 1.20 0.82 - 1.77 ng/dL  Hepatitis C antibody  Result Value Ref Range   Hep C Virus Ab <0.1 0.0 - 0.9 s/co ratio  Hemoglobin A1c  Result Value Ref Range   Hgb A1c MFr Bld 5.5 4.8 - 5.6 %   Est. average glucose Bld gHb Est-mCnc 111 mg/dL  HIV Antibody (routine testing w rflx)  Result Value Ref Range   HIV Screen 4th Generation wRfx Non Reactive Non Reactive

## 2020-06-10 NOTE — Progress Notes (Signed)
   Subjective:    Patient ID: [name] West, male    DOB: 1980/03/27, 40 y.o.   MRN: [id number]  HPI  Patient arrives for a 4 week follow up on blood pressure. Patient states some days it runs good and some days a little high. Patient is taking his medication.  Trying to watch diet.  Tries to stay active. Review of Systems     Objective:   Physical Exam Vitals reviewed.  Constitutional:      General: He is not in acute distress. HENT:     Head: Normocephalic and atraumatic.  Eyes:     General:        Right eye: No discharge.        Left eye: No discharge.  Neck:     Trachea: No tracheal deviation.  Cardiovascular:     Rate and Rhythm: Normal rate and regular rhythm.     Heart sounds: Normal heart sounds. No murmur heard.   Pulmonary:     Effort: Pulmonary effort is normal. No respiratory distress.     Breath sounds: Normal breath sounds.  Lymphadenopathy:     Cervical: No cervical adenopathy.  Skin:    General: Skin is warm and dry.  Neurological:     Mental Status: He is alert.     Coordination: Coordination normal.  Psychiatric:        Behavior: Behavior normal.           Assessment & Plan:  Hypertension Blood pressure here at the office good When rechecked slightly elevated more than likely a component of whitecoat syndrome He will follow-up for his wellness later this month Labs reviewed with patient

## 2020-07-01 ENCOUNTER — Other Ambulatory Visit: Payer: Self-pay

## 2020-07-01 ENCOUNTER — Encounter: Payer: Self-pay | Admitting: Family Medicine

## 2020-07-01 ENCOUNTER — Ambulatory Visit (INDEPENDENT_AMBULATORY_CARE_PROVIDER_SITE_OTHER): Payer: 59 | Admitting: Family Medicine

## 2020-07-01 ENCOUNTER — Other Ambulatory Visit (HOSPITAL_COMMUNITY): Payer: Self-pay

## 2020-07-01 VITALS — BP 125/78 | HR 61 | Temp 98.1°F | Ht 67.5 in | Wt 272.0 lb

## 2020-07-01 DIAGNOSIS — Z0001 Encounter for general adult medical examination with abnormal findings: Secondary | ICD-10-CM

## 2020-07-01 DIAGNOSIS — M7711 Lateral epicondylitis, right elbow: Secondary | ICD-10-CM

## 2020-07-01 DIAGNOSIS — K439 Ventral hernia without obstruction or gangrene: Secondary | ICD-10-CM

## 2020-07-01 DIAGNOSIS — R200 Anesthesia of skin: Secondary | ICD-10-CM | POA: Diagnosis not present

## 2020-07-01 DIAGNOSIS — Z0000 Encounter for general adult medical examination without abnormal findings: Secondary | ICD-10-CM

## 2020-07-01 MED ORDER — SERTRALINE HCL 100 MG PO TABS
100.0000 mg | ORAL_TABLET | Freq: Every day | ORAL | 1 refills | Status: DC
  Filled 2020-07-01 – 2020-09-26 (×2): qty 90, 90d supply, fill #0
  Filled 2021-01-22: qty 90, 90d supply, fill #1

## 2020-07-01 MED ORDER — AMLODIPINE BESYLATE 5 MG PO TABS
5.0000 mg | ORAL_TABLET | Freq: Every day | ORAL | 1 refills | Status: DC
  Filled 2020-07-01 – 2020-09-26 (×2): qty 90, 90d supply, fill #0
  Filled 2021-01-22: qty 90, 90d supply, fill #1

## 2020-07-01 MED ORDER — POTASSIUM CHLORIDE CRYS ER 10 MEQ PO TBCR
10.0000 meq | EXTENDED_RELEASE_TABLET | Freq: Every day | ORAL | 1 refills | Status: DC
  Filled 2020-07-01 – 2020-09-26 (×2): qty 90, 90d supply, fill #0
  Filled 2021-01-22: qty 90, 90d supply, fill #1

## 2020-07-01 MED ORDER — VALSARTAN-HYDROCHLOROTHIAZIDE 160-25 MG PO TABS
1.0000 | ORAL_TABLET | Freq: Every day | ORAL | 1 refills | Status: DC
  Filled 2020-07-01 – 2020-09-26 (×2): qty 90, 90d supply, fill #0
  Filled 2021-01-22: qty 90, 90d supply, fill #1

## 2020-07-01 NOTE — Progress Notes (Signed)
   Subjective:    Patient ID: [name] West, male    DOB: 1980-08-16, 40 y.o.   MRN: [id number]  HPI The patient comes in today for a wellness visit. Patient has high blood pressure takes his medicine regular basis Patient's moods are doing well  A review of their health history was completed.  A review of medications was also completed.  Any needed refills: none  Eating habits: health conscious  Falls/  MVA accidents in past few months: none  Regular exercise: outside activites, mow, yard work, keep up with two kids.   Specialist pt sees on regular basis: none  Preventative health issues were discussed.   Additional concerns: none    Review of Systems     Objective:   Physical Exam General-in no acute distress Eyes-no discharge Lungs-respiratory rate normal, CTA CV-no murmurs,RRR Extremities skin warm dry no edema Neuro grossly normal Behavior normal, alert        Assessment & Plan:   Adult wellness-complete.wellness physical was conducted today. Importance of diet and exercise were discussed in detail.  In addition to this a discussion regarding safety was also covered. We also reviewed over immunizations and gave recommendations regarding current immunization needed for age.  In addition to this additional areas were also touched on including: Preventative health exams needed:  Colonoscopy not indicated currently  Patient was advised yearly wellness exam Mild ventral hernia I would recommend watching this.  Keep weight and check  Morbid obesity watch diet exercise try to keep weight down  Blood pressure good control continue current measures combined valsartan and HCTZ together  Follow-up in 6 months  Moods are doing well continue Zoloft not suicidal

## 2020-07-10 ENCOUNTER — Ambulatory Visit (INDEPENDENT_AMBULATORY_CARE_PROVIDER_SITE_OTHER): Payer: 59

## 2020-07-10 ENCOUNTER — Encounter: Payer: Self-pay | Admitting: Emergency Medicine

## 2020-07-10 ENCOUNTER — Ambulatory Visit
Admission: EM | Admit: 2020-07-10 | Discharge: 2020-07-10 | Disposition: A | Discharge status: Home / Self Care | Payer: 59 | Attending: Emergency Medicine | Admitting: Emergency Medicine

## 2020-07-10 DIAGNOSIS — M25561 Pain in right knee: Secondary | ICD-10-CM

## 2020-07-10 MED ORDER — MELOXICAM 15 MG PO TABS: 15.0000 mg | ORAL_TABLET | Freq: Every day | ORAL | 0 refills | Status: DC

## 2020-07-10 NOTE — ED Triage Notes (Signed)
Knee pain on Sunday states pain in on the inner side with a burning sensation and feels sore.  States it hurts to bend the knee back.

## 2020-07-10 NOTE — Discharge Instructions (Addendum)
X-rays negative for fracture or dislocation; radiologist mentions degenerative changes in medial aspect of knee, so, some possible mild arthritis.  This may be the culprit, but there may also be a ligament or meniscus injury as well.   Continue conservative management of rest, ice, and gentle stretches Ace applied May use OTC hinged knee brace for extra support Take mobic as needed for pain relief (may cause abdominal discomfort, ulcers, and GI bleeds avoid taking with other NSAIDs).  Space out from taking zoloft as well Follow up with orthopedist for further evaluation as needed Return or go to the ER if you have any new or worsening symptoms (fever, chills, chest pain, redness, swelling, deformity, etc...)

## 2020-07-10 NOTE — ED Provider Notes (Signed)
[facility]   [id number] 07/10/20 Arrival Time: 1302  CC: RT knee  SUBJECTIVE: History from: patient. [name] West is a 40 y.o. male complains of RT knee pain x 4 days.  Denies a precipitating event or specific injury.  Woke up with pain.  Localizes the pain to the inside of RT knee.  Describes the pain as intermittent and burning in character.  Has tried OTC medications without relief.  Symptoms are made worse with bending knee.  Denies similar symptoms in the past.  Denies fever, chills, erythema, ecchymosis, effusion, weakness, numbness and tingling.    ROS: As per HPI.  All other pertinent ROS negative.     Past Medical History:  Diagnosis Date   Anxiety    Gout    HNP (herniated nucleus pulposus), lumbar    HTN (hypertension)    Hx of low back pain    IBS (irritable bowel syndrome)    Joint pain    Kidney problem    Kidney stones    Leg edema    Sleep apnea    Past Surgical History:  Procedure Laterality Date   BACK SURGERY     KIDNEY STONE SURGERY     LUMBAR LAMINECTOMY/DECOMPRESSION MICRODISCECTOMY Right 02/21/2018   Procedure: Right Lumbar Three-Four Lumbar Four-Five Microdiscectomy;  Surgeon: Erline Levine, MD;  Location: [facility];  Service: Neurosurgery;  Laterality: Right;  Right Lumbar Three-Four Lumbar Four-Five Microdiscectomy   No Known Allergies No current facility-administered medications on file prior to encounter.   Current Outpatient Medications on File Prior to Encounter  Medication Sig Dispense Refill   amLODipine (NORVASC) 5 MG tablet Take 1 tablet (5 mg total) by mouth daily. 90 tablet 1   potassium chloride (KLOR-CON M10) 10 MEQ tablet Take 1 tablet (10 mEq total) by mouth daily. 90 tablet 1   sertraline (ZOLOFT) 100 MG tablet Take 1 tablet (100 mg total) by mouth daily. 90 tablet 1   valsartan-hydrochlorothiazide (DIOVAN-HCT) 160-25 MG tablet Take 1 tablet by mouth daily. 90 tablet 1   Social History   Socioeconomic History   Marital  status: Married    Spouse name: Yacoub Diltz   Number of children: 2   Years of education: Not on file   Highest education level: Not on file  Occupational History   Occupation: Freight forwarder  Tobacco Use   Smoking status: Never   Smokeless tobacco: Never  Vaping Use   Vaping Use: Never used  Substance and Sexual Activity   Alcohol use: Yes    Comment: occas social   Drug use: No   Sexual activity: Yes  Other Topics Concern   Not on file  Social History Narrative   Not on file   Social Determinants of Health   Financial Resource Strain: Not on file  Food Insecurity: Not on file  Transportation Needs: Not on file  Physical Activity: Not on file  Stress: Not on file  Social Connections: Not on file  Intimate Partner Violence: Not on file   Family History  Problem Relation Age of Onset   Cancer Mother        Lung   Hyperlipidemia Father    Heart disease Father    Kidney Stones Father    Heart disease Paternal Grandfather 24       died of MI    OBJECTIVE:  Vitals:   07/10/20 1315  BP: 123/78  Pulse: 61  Resp: 16  Temp: 98.4 F (36.9 C)  TempSrc: Oral  SpO2: 95%    General appearance: ALERT; in no acute distress.  Head: NCAT Lungs: Normal respiratory effort Musculoskeletal: RT knee  Inspection: Skin warm, dry, clear and intact without obvious erythema, effusion, or ecchymosis.  Palpation: TTP MJL ROM: FROM active and passive Strength: 5/5 knee flexion, 5/5 knee extension Skin: warm and dry Neurologic: Ambulates without difficulty; Sensation intact about the lower extremities Psychological: alert and cooperative; normal mood and affect  DIAGNOSTIC STUDIES:  DG Knee Complete 4 Views Right  Result Date: 07/10/2020 CLINICAL DATA:  Right knee pain EXAM: RIGHT KNEE - COMPLETE 4+ VIEW COMPARISON:  None. FINDINGS: There is no evidence of acute fracture. There is mild medial predominant degenerative change. There is no significant joint effusion.  IMPRESSION: Mild medial predominant degenerative change of the right knee. No evidence of acute fracture. Electronically Signed   By: Maurine Simmering   On: 07/10/2020 14:12      I have reviewed the x-rays myself and the radiologist interpretation. I am in agreement with the radiologist interpretation.     ASSESSMENT & PLAN:  1. Acute pain of right knee   2. Medial joint line tenderness of knee, right      Meds ordered this encounter  Medications   meloxicam (MOBIC) 15 MG tablet    Sig: Take 1 tablet (15 mg total) by mouth daily.    Dispense:  20 tablet    Refill:  0    Order Specific Question:   Supervising Provider    Answer:   Raylene Everts [[id number]]    X-rays negative for fracture or dislocation; radiologist mentions degenerative changes in medial aspect of knee, so, some possible mild arthritis.  This may be the culprit, but there may also be a ligament or meniscus injury as well.   Continue conservative management of rest, ice, and gentle stretches Ace applied May use OTC hinged knee brace for extra support Take mobic as needed for pain relief (may cause abdominal discomfort, ulcers, and GI bleeds avoid taking with other NSAIDs).  Space out from taking zoloft as well Follow up with orthopedist for further evaluation as needed Return or go to the ER if you have any new or worsening symptoms (fever, chills, chest pain, redness, swelling, deformity, etc...)   Reviewed expectations re: course of current medical issues. Questions answered. Outlined signs and symptoms indicating need for more acute intervention. Patient verbalized understanding. After Visit Summary given.     Lestine Box, PA-C 07/10/20 1430

## 2020-08-12 ENCOUNTER — Other Ambulatory Visit (HOSPITAL_COMMUNITY): Payer: Self-pay

## 2020-08-12 MED ORDER — CARESTART COVID-19 HOME TEST VI KIT
PACK | 0 refills | Status: DC
  Filled 2020-08-12: qty 4, 4d supply, fill #0

## 2020-08-13 ENCOUNTER — Encounter: Payer: Self-pay | Admitting: Family Medicine

## 2020-08-13 ENCOUNTER — Other Ambulatory Visit: Payer: Self-pay

## 2020-08-13 ENCOUNTER — Telehealth (INDEPENDENT_AMBULATORY_CARE_PROVIDER_SITE_OTHER): Payer: 59 | Admitting: Family Medicine

## 2020-08-13 ENCOUNTER — Telehealth: Payer: Self-pay | Admitting: *Deleted

## 2020-08-13 DIAGNOSIS — Z20822 Contact with and (suspected) exposure to covid-19: Secondary | ICD-10-CM

## 2020-08-13 NOTE — Telephone Encounter (Signed)
[name], vereb are scheduled for a virtual visit with your provider today.    Just as we do with appointments in the office, we must obtain your consent to participate.  Your consent will be active for this visit and any virtual visit you may have with one of our providers in the next 365 days.    If you have a MyChart account, I can also send a copy of this consent to you electronically.  All virtual visits are billed to your insurance company just like a traditional visit in the office.  As this is a virtual visit, video technology does not allow for your provider to perform a traditional examination.  This may limit your provider's ability to fully assess your condition.  If your provider identifies any concerns that need to be evaluated in person or the need to arrange testing such as labs, EKG, etc, we will make arrangements to do so.    Although advances in technology are sophisticated, we cannot ensure that it will always work on either your end or our end.  If the connection with a video visit is poor, we may have to switch to a telephone visit.  With either a video or telephone visit, we are not always able to ensure that we have a secure connection.   I need to obtain your verbal consent now.   Are you willing to proceed with your visit today?   [name] West has provided verbal consent on 08/13/2020 for a virtual visit (video or telephone).

## 2020-08-13 NOTE — Telephone Encounter (Signed)
Mr. [name] West, [name] West are scheduled for a virtual visit with your provider today.    Just as we do with appointments in the office, we must obtain your consent to participate.  Your consent will be active for this visit and any virtual visit you may have with one of our providers in the next 365 days.    If you have a MyChart account, I can also send a copy of this consent to you electronically.  All virtual visits are billed to your insurance company just like a traditional visit in the office.  As this is a virtual visit, video technology does not allow for your provider to perform a traditional examination.  This may limit your provider's ability to fully assess your condition.  If your provider identifies any concerns that need to be evaluated in person or the need to arrange testing such as labs, EKG, etc, we will make arrangements to do so.    Although advances in technology are sophisticated, we cannot ensure that it will always work on either your end or our end.  If the connection with a video visit is poor, we may have to switch to a telephone visit.  With either a video or telephone visit, we are not always able to ensure that we have a secure connection.   I need to obtain your verbal consent now.   Are you willing to proceed with your visit today?   [name] West has provided verbal consent on 08/13/2020 for a virtual visit (video or telephone).

## 2020-08-13 NOTE — Progress Notes (Signed)
Patient ID: [name] A Tomaselli, male    DOB: 1980/01/22, 40 y.o.   MRN: [id number]   Chief Complaint  Patient presents with   Cough    Headache and fever since yesterday- exposed to Covid last week   Subjective:    HPI Pt stating had exposure last 5 days with covid. Having headache and fever started yesterday. 101.41F this am. Started last night with headache.  Persisting headache.   Meds- tylenol and ibuprofen. Dull pain behind eyes. No other uri symptoms  Pt going to get covid testing tonight. Pt will send result to [place] and call with the results and we can give him the covid medication if needed.  Medical History [name] has a past medical history of Anxiety, Gout, HNP (herniated nucleus pulposus), lumbar, HTN (hypertension), low back pain, IBS (irritable bowel syndrome), Joint pain, Kidney problem, Kidney stones, Leg edema, and Sleep apnea.   Outpatient Encounter Medications as of 08/13/2020  Medication Sig   amLODipine (NORVASC) 5 MG tablet Take 1 tablet (5 mg total) by mouth daily.   COVID-19 At Home Antigen Test [facility] COVID-19 HOME TEST) KIT Use as directed.   meloxicam (MOBIC) 15 MG tablet Take 1 tablet (15 mg total) by mouth daily.   potassium chloride (KLOR-CON M10) 10 MEQ tablet Take 1 tablet (10 mEq total) by mouth daily.   sertraline (ZOLOFT) 100 MG tablet Take 1 tablet (100 mg total) by mouth daily.   valsartan-hydrochlorothiazide (DIOVAN-HCT) 160-25 MG tablet Take 1 tablet by mouth daily.   No facility-administered encounter medications on file as of 08/13/2020.     Review of Systems  Constitutional:  Positive for fever. Negative for chills.  HENT:  Negative for congestion, ear pain, rhinorrhea, sinus pressure, sinus pain, sneezing and sore throat.   Eyes:  Negative for pain, discharge and itching.  Respiratory:  Positive for cough.   Gastrointestinal:  Negative for diarrhea, nausea and vomiting.  Skin:  Negative for rash.  Neurological:  Positive for headaches.     Vitals There were no vitals taken for this visit.  Objective:   Physical Exam No PE due to phone visit.  Assessment and Plan   1. Exposure to COVID-19 virus   -symptomatic treatment in meantime. -tylenol, ibuprofen, fluids. -otc sinus meds/cough syrup prn. Pt will get otc covid test and will call with results or call and come in to office for covid test.  Return if symptoms worsen or fail to improve.   Virtual Visit via Telephone Note  I connected with [name] West on 08/13/20 at  2:10 PM EDT by telephone and verified that I am speaking with the correct person using two identifiers.  Location: Patient: home Provider: office   I discussed the limitations, risks, security and privacy concerns of performing an evaluation and management service by telephone and the availability of in person appointments. I also discussed with the patient that there may be a patient responsible charge related to this service. The patient expressed understanding and agreed to proceed.    Follow Up Instructions:    I discussed the assessment and treatment plan with the patient. The patient was provided an opportunity to ask questions and all were answered. The patient agreed with the plan and demonstrated an understanding of the instructions.   The patient was advised to call back or seek an in-person evaluation if the symptoms worsen or if the condition fails to improve as anticipated.  I provided 15 minutes of non-face-to-face time during this encounter.

## 2020-08-14 MED ORDER — NIRMATRELVIR/RITONAVIR (PAXLOVID)TABLET: 3.0000 | ORAL_TABLET | Freq: Two times a day (BID) | ORAL | 0 refills | Status: AC

## 2020-08-26 ENCOUNTER — Telehealth: Payer: Self-pay | Admitting: Family Medicine

## 2020-08-26 NOTE — Telephone Encounter (Signed)
[name] West, patients wife called in asking why they keep receiving a bill for DOS 10/15/2019. I have looked at his account and I see where it was filed to insurance but I don't see where they have paid anything. She would like you to reach out to her in reference to bill so she can pay what they owe this Thursday.  CB# [phone]

## 2020-09-26 ENCOUNTER — Ambulatory Visit: Payer: 59 | Admitting: Diagnostic Neuroimaging

## 2020-09-26 ENCOUNTER — Other Ambulatory Visit: Payer: Self-pay

## 2020-09-26 ENCOUNTER — Encounter: Payer: Self-pay | Admitting: Diagnostic Neuroimaging

## 2020-09-26 ENCOUNTER — Other Ambulatory Visit (HOSPITAL_COMMUNITY): Payer: Self-pay

## 2020-09-26 VITALS — BP 139/81 | HR 57 | Ht 69.0 in | Wt 266.0 lb

## 2020-09-26 DIAGNOSIS — R200 Anesthesia of skin: Secondary | ICD-10-CM | POA: Diagnosis not present

## 2020-09-26 NOTE — Progress Notes (Signed)
[facility]  PATIENT: [name] West DOB: [birth year]  REFERRING CLINICIAN: Kathyrn Drown, MD HISTORY FROM: PATIENT  REASON FOR VISIT: new consult    HISTORICAL  CHIEF COMPLAINT:  Chief Complaint  Patient presents with   New Patient (Initial Visit)    Pt alone, rm 7 bilateral hand problem. This has been ongoing for 8 yrs. He states that he has decreased grip strength. Sensation is off in both hands. He says the pinky finger on both hands are ok but the other fingers are numb    HISTORY OF PRESENT ILLNESS:   40 year old male here for evaluation of bilateral hand numbness.  Symptoms started around 2011.  He describes numbness, pain, weakness in his fingers and hands.  Digits 1 through 4 bilaterally are mainly affected.  He also has some pain discomfort around his bilateral elbows.  Sometimes when he rapidly extends his elbow he feels a shocklike sensation into his fourth and fifth digit.  Patient denies any neck pain.  He has had low back pain issues in the past status post surgeries.   REVIEW OF SYSTEMS: Full 14 system review of systems performed and negative with exception of: As per HPI.  ALLERGIES: No Known Allergies  HOME MEDICATIONS: Outpatient Medications Prior to Visit  Medication Sig Dispense Refill   amLODipine (NORVASC) 5 MG tablet Take 1 tablet (5 mg total) by mouth daily. 90 tablet 1   COVID-19 At Home Antigen Test ([facility] COVID-19 HOME TEST) KIT Use as directed. 4 each 0   meloxicam (MOBIC) 15 MG tablet Take 1 tablet (15 mg total) by mouth daily. 20 tablet 0   potassium chloride (KLOR-CON M10) 10 MEQ tablet Take 1 tablet (10 mEq total) by mouth daily. 90 tablet 1   sertraline (ZOLOFT) 100 MG tablet Take 1 tablet (100 mg total) by mouth daily. 90 tablet 1   valsartan-hydrochlorothiazide (DIOVAN-HCT) 160-25 MG tablet Take 1 tablet by mouth daily. 90 tablet 1   No facility-administered medications prior to visit.    PAST MEDICAL  HISTORY: Past Medical History:  Diagnosis Date   Anxiety    Gout    HNP (herniated nucleus pulposus), lumbar    HTN (hypertension)    Hx of low back pain    IBS (irritable bowel syndrome)    Joint pain    Kidney problem    Kidney stones    Leg edema    Sleep apnea     PAST SURGICAL HISTORY: Past Surgical History:  Procedure Laterality Date   BACK SURGERY     KIDNEY STONE SURGERY     LUMBAR LAMINECTOMY/DECOMPRESSION MICRODISCECTOMY Right 02/21/2018   Procedure: Right Lumbar Three-Four Lumbar Four-Five Microdiscectomy;  Surgeon: Erline Levine, MD;  Location: [facility];  Service: Neurosurgery;  Laterality: Right;  Right Lumbar Three-Four Lumbar Four-Five Microdiscectomy    FAMILY HISTORY: Family History  Problem Relation Age of Onset   Cancer Mother        Lung   Hyperlipidemia Father    Heart disease Father    Kidney Stones Father    Heart disease Paternal Grandfather 37       died of MI    SOCIAL HISTORY: Social History   Socioeconomic History   Marital status: Married    Spouse name: Natan Hartog   Number of children: 2   Years of education: Not on file   Highest education level: Not on file  Occupational History   Occupation: Freight forwarder  Tobacco Use   Smoking  status: Never   Smokeless tobacco: Never  Vaping Use   Vaping Use: Never used  Substance and Sexual Activity   Alcohol use: Yes    Comment: occas social   Drug use: No   Sexual activity: Yes  Other Topics Concern   Not on file  Social History Narrative   Not on file   Social Determinants of Health   Financial Resource Strain: Not on file  Food Insecurity: Not on file  Transportation Needs: Not on file  Physical Activity: Not on file  Stress: Not on file  Social Connections: Not on file  Intimate Partner Violence: Not on file     PHYSICAL EXAM  GENERAL EXAM/CONSTITUTIONAL: Vitals:  Vitals:   09/26/20 1031  BP: 139/81  Pulse: (!) 57  Weight: 266 lb (120.7 kg)  Height: $Remove'5\' 9"'WjMAJtL$   (1.753 m)   Body mass index is 39.28 kg/m. Wt Readings from Last 3 Encounters:  09/26/20 266 lb (120.7 kg)  07/01/20 272 lb (123.4 kg)  06/10/20 271 lb 6.4 oz (123.1 kg)   Patient is in no distress; well developed, nourished and groomed; neck is supple  CARDIOVASCULAR: Examination of carotid arteries is normal; no carotid bruits Regular rate and rhythm, no murmurs Examination of peripheral vascular system by observation and palpation is normal  EYES: Ophthalmoscopic exam of optic discs and posterior segments is normal; no papilledema or hemorrhages No results found.  MUSCULOSKELETAL: Gait, strength, tone, movements noted in Neurologic exam below  NEUROLOGIC: MENTAL STATUS:  No flowsheet data found. awake, alert, oriented to person, place and time recent and remote memory intact normal attention and concentration language fluent, comprehension intact, naming intact fund of knowledge appropriate  CRANIAL NERVE:  2nd - no papilledema on fundoscopic exam 2nd, 3rd, 4th, 6th - pupils equal and reactive to light, visual fields full to confrontation, extraocular muscles intact, no nystagmus 5th - facial sensation symmetric 7th - facial strength symmetric 8th - hearing intact 9th - palate elevates symmetrically, uvula midline 11th - shoulder shrug symmetric 12th - tongue protrusion midline  MOTOR:  normal bulk and tone, full strength in the BUE, BLE  SENSORY:  normal and symmetric to light touch, pinprick, temperature, vibration; EXCEPT DECR PP IN BILATERAL FINGERS (ALL DIGITS) POSITIVE PHALEN'S BILATERALLY  COORDINATION:  finger-nose-finger, fine finger movements normal  REFLEXES:  deep tendon reflexes TRACE and symmetric  GAIT/STATION:  narrow based gait     DIAGNOSTIC DATA (LABS, IMAGING, TESTING) - I reviewed patient records, labs, notes, testing and imaging myself where available.  Lab Results  Component Value Date   WBC 10.0 08/11/2018   HGB 15.2  08/11/2018   HCT 46.7 08/11/2018   MCV 83 08/11/2018   PLT 293 08/11/2018      Component Value Date/Time   NA 140 06/03/2020 0810   K 4.2 06/03/2020 0810   CL 103 06/03/2020 0810   CO2 22 06/03/2020 0810   GLUCOSE 93 06/03/2020 0810   GLUCOSE 102 (H) 02/20/2018 1426   BUN 16 06/03/2020 0810   CREATININE 0.81 06/03/2020 0810   CREATININE 1.01 11/14/2015 0738   CALCIUM 9.0 06/03/2020 0810   PROT 6.5 06/03/2020 0810   ALBUMIN 4.1 06/03/2020 0810   AST 18 06/03/2020 0810   ALT 25 06/03/2020 0810   ALKPHOS 101 06/03/2020 0810   BILITOT 0.3 06/03/2020 0810   GFRNONAA 107 10/15/2019 1058   GFRAA 124 10/15/2019 1058   Lab Results  Component Value Date   CHOL 173 06/03/2020   HDL 41  06/03/2020   LDLCALC 120 (H) 06/03/2020   TRIG 64 06/03/2020   CHOLHDL 4.2 06/03/2020   Lab Results  Component Value Date   HGBA1C 5.5 06/03/2020   Lab Results  Component Value Date   VITAMINB12 426 02/06/2018   Lab Results  Component Value Date   TSH 2.360 06/03/2020      ASSESSMENT AND PLAN  40 y.o. year old male here with bilateral hand numbness and pain, weakness, suspicious for bilateral carpal tunnel syndrome.  Also with bilateral elbow pain with some radiating symptoms concerning for medial epicondylitis versus ulnar neuropathies.   Dx:  1. Hand numbness     PLAN:  BILATERAL HAND NUMBNESS - check EMG/NCS (eval for CTS and ulnar neuropathies)  Orders Placed This Encounter  Procedures   NCV with EMG(electromyography)    Return for for NCV/EMG.    Penni Bombard, MD 7/83/7542, 37:02 AM Certified in Neurology, Neurophysiology and Neuroimaging  [facility] [address] [place], [place] [address] [phone]

## 2020-11-05 ENCOUNTER — Telehealth: Payer: Self-pay | Admitting: Diagnostic Neuroimaging

## 2020-11-05 NOTE — Telephone Encounter (Signed)
Ncv/emg r/s due to Dr. Leta Baptist out- sent mychart msg & LVM informing pt of new appt.

## 2020-11-13 ENCOUNTER — Encounter: Payer: 59 | Admitting: Diagnostic Neuroimaging

## 2020-11-20 ENCOUNTER — Ambulatory Visit: Payer: 59 | Admitting: Diagnostic Neuroimaging

## 2020-11-20 ENCOUNTER — Encounter (INDEPENDENT_AMBULATORY_CARE_PROVIDER_SITE_OTHER): Payer: 59 | Admitting: Diagnostic Neuroimaging

## 2020-11-20 DIAGNOSIS — G5603 Carpal tunnel syndrome, bilateral upper limbs: Secondary | ICD-10-CM

## 2020-11-20 DIAGNOSIS — R200 Anesthesia of skin: Secondary | ICD-10-CM

## 2020-11-20 DIAGNOSIS — Z0289 Encounter for other administrative examinations: Secondary | ICD-10-CM

## 2020-11-20 NOTE — Procedures (Signed)
[facility]  NCS (NERVE CONDUCTION STUDY) WITH EMG (ELECTROMYOGRAPHY) REPORT   STUDY DATE: 11/20/20 PATIENT NAME: [name] West DOB: Jan 05, 1981 MRN: [id number]  ORDERING CLINICIAN: Andrey Spearman, MD   TECHNOLOGIST: Sherre Scarlet ELECTROMYOGRAPHER: Earlean Polka. Nyjah Denio, MD  CLINICAL INFORMATION: 40 year old male with bilateral hand numbness.  FINDINGS: NERVE CONDUCTION STUDY:  Bilateral median motor responses have prolonged distal latencies, normal amplitudes and normal conduction left face.  Bilateral ulnar motor responses are normal.  Right median sensory response could not be obtained.  Left median sensory response has decreased amplitude and prolonged peak latency.  Bilateral ulnar sensory responses are normal.  Bilateral ulnar F-wave latencies are normal.   NEEDLE ELECTROMYOGRAPHY:  Needle examination of left upper extremity is normal.   IMPRESSION:  Abnormal study demonstrating: -Bilateral median neuropathies at the wrist consistent with bilateral carpal tunnel syndrome (mild on the right and moderate on the left in severity).   INTERPRETING PHYSICIAN:  Penni Bombard, MD Certified in Neurology, Neurophysiology and Neuroimaging  [facility] [address] [place], [place] [address] [phone]  [facility]    Nerve / Sites Muscle Latency Ref. Amplitude Ref. Rel Amp Segments Distance Velocity Ref. Area    ms ms mV mV %  cm m/s m/s mVms  R Median - APB     Wrist APB 6.9 ?4.4 7.1 ?4.0 100 Wrist - APB 7   24.6     Upper arm APB 10.8  6.4  90.7 Upper arm - Wrist 21 54 ?49 22.1  L Median - APB     Wrist APB 5.0 ?4.4 7.3 ?4.0 100 Wrist - APB 7   24.4     Upper arm APB 8.4  6.8  93 Upper arm - Wrist 21 63 ?49 23.2  R Ulnar - ADM     Wrist ADM 2.5 ?3.3 11.8 ?6.0 100 Wrist - ADM 7   29.9     B.Elbow ADM 5.4  11.4  96.6 B.Elbow - Wrist 20 69 ?49 29.6     A.Elbow ADM 6.9  11.7  103 A.Elbow - B.Elbow 10 67 ?49 30.3  L  Ulnar - ADM     Wrist ADM 2.6 ?3.3 11.5 ?6.0 100 Wrist - ADM 7   33.5     B.Elbow ADM 5.5  11.2  97.5 B.Elbow - Wrist 19 67 ?49 33.3     A.Elbow ADM 7.0  11.3  100 A.Elbow - B.Elbow 10 67 ?49 33.3             SNC    Nerve / Sites Rec. Site Peak Lat Ref.  Amp Ref. Segments Distance    ms ms V V  cm  R Median - Orthodromic (Dig II, Mid palm)     Dig II Wrist NR ?3.4 NR ?10 Dig II - Wrist 13  L Median - Orthodromic (Dig II, Mid palm)     Dig II Wrist 4.4 ?3.4 4 ?10 Dig II - Wrist 13  R Ulnar - Orthodromic, (Dig V, Mid palm)     Dig V Wrist 2.4 ?3.1 7 ?5 Dig V - Wrist 11  L Ulnar - Orthodromic, (Dig V, Mid palm)     Dig V Wrist 2.5 ?3.1 9 ?5 Dig V - Wrist 51             F  Wave    Nerve F Lat Ref.   ms ms  R Ulnar - ADM 26.1 ?32.0  L  Ulnar - ADM 26.7 ?32.0

## 2020-11-26 ENCOUNTER — Telehealth: Payer: Self-pay | Admitting: Diagnostic Neuroimaging

## 2020-11-26 NOTE — Telephone Encounter (Signed)
Hand surgery referral sent to [facility]. Phone: [phone].

## 2020-12-09 ENCOUNTER — Encounter: Payer: Self-pay | Admitting: Family Medicine

## 2020-12-10 ENCOUNTER — Other Ambulatory Visit: Payer: Self-pay | Admitting: Family Medicine

## 2020-12-10 NOTE — Telephone Encounter (Signed)
1.  Will need to know which pharmacy? #2 May have Flomax 0.4 mg 1 daily, #21 use as directed to help with passing kidney stone 3.  Options include ibuprofen or oxycodone, we can do limited amount of oxycodone also if kidney stone is not improving over the next 48 hours needs office or ER evaluation  Please clarify with family in regards to pain medication and which pharmacy they want this sent to thank you

## 2020-12-19 DIAGNOSIS — Z23 Encounter for immunization: Secondary | ICD-10-CM | POA: Diagnosis not present

## 2021-01-01 ENCOUNTER — Encounter: Payer: 59 | Admitting: Diagnostic Neuroimaging

## 2021-01-22 ENCOUNTER — Other Ambulatory Visit (HOSPITAL_COMMUNITY): Payer: Self-pay

## 2021-01-23 ENCOUNTER — Other Ambulatory Visit (HOSPITAL_COMMUNITY): Payer: Self-pay

## 2021-02-04 ENCOUNTER — Other Ambulatory Visit: Payer: Self-pay

## 2021-02-04 ENCOUNTER — Ambulatory Visit
Admission: EM | Admit: 2021-02-04 | Discharge: 2021-02-04 | Disposition: A | Discharge status: Home / Self Care | Payer: 59 | Attending: Family Medicine | Admitting: Family Medicine

## 2021-02-04 DIAGNOSIS — J029 Acute pharyngitis, unspecified: Secondary | ICD-10-CM | POA: Insufficient documentation

## 2021-02-04 DIAGNOSIS — K122 Cellulitis and abscess of mouth: Secondary | ICD-10-CM | POA: Insufficient documentation

## 2021-02-04 LAB — POCT RAPID STREP A (OFFICE): Rapid Strep A Screen: NEGATIVE

## 2021-02-04 MED ORDER — AMOXICILLIN 875 MG PO TABS: 875.0000 mg | ORAL_TABLET | Freq: Two times a day (BID) | ORAL | 0 refills | Status: DC

## 2021-02-04 MED ORDER — PREDNISONE 20 MG PO TABS: 40.0000 mg | ORAL_TABLET | Freq: Every day | ORAL | 0 refills | Status: DC

## 2021-02-04 NOTE — ED Provider Notes (Addendum)
[facility]   [id number] 02/04/21 Arrival Time: 1443  ASSESSMENT & PLAN:  1. Sore throat   2. Uvulitis    Rapid strep negative. OTC symptom care as needed. Strep exposure. Will await throat culture but given paper Rx for amox should he suddenly spike a fever with worsening ST over next 24 hours.  Meds ordered this encounter  Medications   amoxicillin (AMOXIL) 875 MG tablet    Sig: Take 1 tablet (875 mg total) by mouth 2 (two) times daily.    Dispense:  20 tablet    Refill:  0   predniSONE (DELTASONE) 20 MG tablet    Sig: Take 2 tablets (40 mg total) by mouth daily.    Dispense:  10 tablet    Refill:  0       Follow-up Information     Kathyrn Drown, MD.   Specialty: Family Medicine Why: If worsening or failing to improve as anticipated. Contact information: [address] [place] [address] [phone]                 Reviewed expectations re: course of current medical issues. Questions answered. Outlined signs and symptoms indicating need for more acute intervention. Understanding verbalized. After Visit Summary given.   SUBJECTIVE: History from: patient. [name] West is a 41 y.o. male who reports: ST; abrupt onset this am; wife with + strep last week. No cough/resp symptoms. Denies: fever. Normal PO intake without n/v/d.  OBJECTIVE:  Vitals:   02/04/21 0855  BP: 138/80  Pulse: (!) 58  Resp: 18  Temp: 98.5 F (36.9 C)  TempSrc: Oral  SpO2: 95%    General appearance: alert; no distress Eyes: PERRLA; EOMI; conjunctiva normal HENT: [place]; AT; without nasal congestion; throat with mild/mod erythema; uvula is midline but enlarged/swollen; no tonsil exudates Neck: supple with small R sided LAD Lungs: speaks full sentences without difficulty; unlabored Extremities: no edema Skin: warm and dry Neurologic: normal gait Psychological: alert and cooperative; normal mood and affect  Labs: Results for orders placed or  performed during the hospital encounter of 02/04/21  POCT rapid strep A  Result Value Ref Range   Rapid Strep A Screen Negative Negative   Labs Reviewed  CULTURE, GROUP A STREP [facility])  POCT RAPID STREP A (OFFICE)     No Known Allergies  Past Medical History:  Diagnosis Date   Anxiety    Gout    HNP (herniated nucleus pulposus), lumbar    HTN (hypertension)    Hx of low back pain    IBS (irritable bowel syndrome)    Joint pain    Kidney problem    Kidney stones    Leg edema    Sleep apnea    Social History   Socioeconomic History   Marital status: Married    Spouse name: Julies Carmickle   Number of children: 2   Years of education: Not on file   Highest education level: Not on file  Occupational History   Occupation: Freight forwarder  Tobacco Use   Smoking status: Never   Smokeless tobacco: Never  Vaping Use   Vaping Use: Never used  Substance and Sexual Activity   Alcohol use: Yes    Comment: occas social   Drug use: No   Sexual activity: Yes  Other Topics Concern   Not on file  Social History Narrative   Not on file   Social Determinants of Health   Financial Resource Strain: Not on  file  Food Insecurity: Not on file  Transportation Needs: Not on file  Physical Activity: Not on file  Stress: Not on file  Social Connections: Not on file  Intimate Partner Violence: Not on file   Family History  Problem Relation Age of Onset   Cancer Mother        Lung   Hyperlipidemia Father    Heart disease Father    Kidney Stones Father    Heart disease Paternal Grandfather 57       died of MI   Past Surgical History:  Procedure Laterality Date   BACK SURGERY     KIDNEY STONE SURGERY     LUMBAR LAMINECTOMY/DECOMPRESSION MICRODISCECTOMY Right 02/21/2018   Procedure: Right Lumbar Three-Four Lumbar Four-Five Microdiscectomy;  Surgeon: Erline Levine, MD;  Location: [facility];  Service: Neurosurgery;  Laterality: Right;  Right Lumbar Three-Four Lumbar Four-Five  Microdiscectomy     Vanessa Kick, MD 02/04/21 1030    Vanessa Kick, MD 02/04/21 1031

## 2021-02-04 NOTE — Discharge Instructions (Signed)
You may use over the counter ibuprofen or acetaminophen as needed.  For a sore throat, over the counter products such as [employer] Peroxyl Mouth Sore Rinse or Chloraseptic Sore Throat Spray may provide some temporary relief. Your rapid strep test was negative today. We have sent your throat swab for culture and will let you know of any positive results. 

## 2021-02-04 NOTE — ED Triage Notes (Signed)
Pt reports sore throat x 1 day. States is painful when swallows. Pt reports his wife had Strep 5 days ago.

## 2021-02-05 LAB — CULTURE, GROUP A STREP (THRC)

## 2021-05-07 ENCOUNTER — Ambulatory Visit (INDEPENDENT_AMBULATORY_CARE_PROVIDER_SITE_OTHER): Payer: 59 | Admitting: Family Medicine

## 2021-05-07 ENCOUNTER — Encounter: Payer: Self-pay | Admitting: Family Medicine

## 2021-05-07 VITALS — BP 156/88 | HR 56 | Temp 97.9°F | Wt 273.4 lb

## 2021-05-07 DIAGNOSIS — J069 Acute upper respiratory infection, unspecified: Secondary | ICD-10-CM | POA: Diagnosis not present

## 2021-05-07 DIAGNOSIS — Z20822 Contact with and (suspected) exposure to covid-19: Secondary | ICD-10-CM | POA: Diagnosis not present

## 2021-05-07 NOTE — Progress Notes (Signed)
? ?  Subjective:  ? ? Patient ID: [name] West, male    DOB: 09/18/1980, 41 y.o.   MRN: [id number] ? ?HPI ? ?Patient is having headache and congestion since Tuesday. Coughing started today. Patient says he works for [employer] and was told he needed to have a COVID test done before he could return back to work. ?Head congestion drainage coughing symptoms over the past few days ?Review of Systems ? ?   ?Objective:  ? Physical Exam ? ?Lungs clear heart regular HEENT benign ? ? ?   ?Assessment & Plan:  ?Mild head congestion probable viral COVID test taken ?Warning signs discussed ?Supportive measures discussed ?Follow-up for regular annual wellness is and blood pressure rechecks ?

## 2021-05-09 LAB — SPECIMEN STATUS REPORT

## 2021-05-09 LAB — NOVEL CORONAVIRUS, NAA: SARS-CoV-2, NAA: NOT DETECTED

## 2021-05-27 ENCOUNTER — Encounter: Payer: Self-pay | Admitting: Family Medicine

## 2021-05-28 NOTE — Telephone Encounter (Signed)
Script written and awaiting doctor's signature

## 2021-06-09 ENCOUNTER — Other Ambulatory Visit: Payer: Self-pay | Admitting: Family Medicine

## 2021-06-09 ENCOUNTER — Other Ambulatory Visit: Payer: Self-pay

## 2021-06-09 ENCOUNTER — Other Ambulatory Visit (HOSPITAL_COMMUNITY): Payer: Self-pay

## 2021-06-09 MED ORDER — POTASSIUM CHLORIDE CRYS ER 10 MEQ PO TBCR
10.0000 meq | EXTENDED_RELEASE_TABLET | Freq: Every day | ORAL | 1 refills | Status: DC
  Filled 2021-06-09 (×2): qty 90, 90d supply, fill #0

## 2021-06-09 MED ORDER — PREDNISONE 10 MG (21) PO TBPK
ORAL_TABLET | ORAL | 0 refills | Status: DC
  Filled 2021-06-09 – 2021-06-10 (×2): qty 21, 6d supply, fill #0

## 2021-06-09 MED ORDER — SERTRALINE HCL 100 MG PO TABS
100.0000 mg | ORAL_TABLET | Freq: Every day | ORAL | 0 refills | Status: DC
  Filled 2021-06-09: qty 90, 90d supply, fill #0

## 2021-06-09 MED ORDER — VALSARTAN-HYDROCHLOROTHIAZIDE 160-25 MG PO TABS
1.0000 | ORAL_TABLET | Freq: Every day | ORAL | 1 refills | Status: DC
  Filled 2021-06-09 (×2): qty 90, 90d supply, fill #0

## 2021-06-09 MED ORDER — AMLODIPINE BESYLATE 5 MG PO TABS
5.0000 mg | ORAL_TABLET | Freq: Every day | ORAL | 1 refills | Status: DC
  Filled 2021-06-09 (×2): qty 90, 90d supply, fill #0

## 2021-06-09 NOTE — Telephone Encounter (Signed)
Nurses may have 90 days with no additional refills needs to keep his follow-up visit thank you

## 2021-06-10 ENCOUNTER — Other Ambulatory Visit (HOSPITAL_COMMUNITY): Payer: Self-pay

## 2021-07-08 ENCOUNTER — Ambulatory Visit: Payer: 59 | Admitting: Family Medicine

## 2021-07-08 ENCOUNTER — Other Ambulatory Visit (HOSPITAL_COMMUNITY): Payer: Self-pay

## 2021-07-08 VITALS — HR 62 | Temp 98.1°F | Ht 69.0 in | Wt 282.0 lb

## 2021-07-08 DIAGNOSIS — E7849 Other hyperlipidemia: Secondary | ICD-10-CM | POA: Diagnosis not present

## 2021-07-08 DIAGNOSIS — R739 Hyperglycemia, unspecified: Secondary | ICD-10-CM

## 2021-07-08 DIAGNOSIS — I10 Essential (primary) hypertension: Secondary | ICD-10-CM | POA: Diagnosis not present

## 2021-07-08 MED ORDER — VALSARTAN-HYDROCHLOROTHIAZIDE 160-25 MG PO TABS
1.0000 | ORAL_TABLET | Freq: Every day | ORAL | 1 refills | Status: DC
  Filled 2021-07-08 – 2021-09-07 (×2): qty 90, 90d supply, fill #0
  Filled 2021-12-01: qty 90, 90d supply, fill #1

## 2021-07-08 MED ORDER — POTASSIUM CHLORIDE CRYS ER 10 MEQ PO TBCR
10.0000 meq | EXTENDED_RELEASE_TABLET | Freq: Every day | ORAL | 1 refills | Status: DC
  Filled 2021-07-08 – 2021-09-07 (×2): qty 90, 90d supply, fill #0
  Filled 2021-12-01: qty 90, 90d supply, fill #1

## 2021-07-08 MED ORDER — SERTRALINE HCL 100 MG PO TABS
100.0000 mg | ORAL_TABLET | Freq: Every day | ORAL | 1 refills | Status: DC
  Filled 2021-07-08 – 2021-09-07 (×2): qty 90, 90d supply, fill #0
  Filled 2021-12-01: qty 90, 90d supply, fill #1

## 2021-07-08 MED ORDER — AMLODIPINE BESYLATE 5 MG PO TABS
5.0000 mg | ORAL_TABLET | Freq: Every day | ORAL | 1 refills | Status: DC
  Filled 2021-07-08 – 2021-09-07 (×2): qty 90, 90d supply, fill #0
  Filled 2021-12-01: qty 90, 90d supply, fill #1

## 2021-07-08 NOTE — Progress Notes (Signed)
   Subjective:    Patient ID: [name] West, male    DOB: March 04, 1980, 41 y.o.   MRN: [id number]  Hypertension This is a chronic problem. Treatments tried: diovan/hct, amlodipine.   Patient c/o obesity and express interest in weight loss meidcation Patient relates that he feels a lot of fatigue tiredness when he tries to walk experiences a lot of pain and discomfort in his knees and finds himself low on energy because of his obesity denies any chest pressure tightness or pain he has been trying to watch how he eats he needs to cut back more on sodas we did discuss healthy eating patterns we also discussed portion control minimizing sugars Also discussed pitting and walking on a regular basis preferably at least 4 days/week for 30 minutes and physical activity of some sort of 30 to 60 minutes every single day He states he has been doing most of this over the past month without much improvement with his weight He is interested with GLP-1 medicines  Review of Systems     Objective:   Physical Exam General-in no acute distress Eyes-no discharge Lungs-respiratory rate normal, CTA CV-no murmurs,RRR Extremities skin warm dry no edema Neuro grossly normal Behavior normal, alert        Assessment & Plan:  1. Essential hypertension Blood pressure decent control continue current medications minimize salt regular physical activity - Basic metabolic panel - Hepatic Function Panel - Lipid panel - Hemoglobin A1c  2. Morbid obesity ([name]) See discussion above Portion control Regular physical activity Minimize sugar starches 30 to 60 minutes of physical activity every day 30 minutes of walking at least 4 times a week Patient should target trying to lose at least 30 pounds over the course of the next 3 to 6 months We did discuss GLP-1 medications having worked I believe they would be of benefit for him We will go forward with ordering Wegovy Side effects was discussed He denies any  personal history of thyroid cancer or family history of thyroid cancer He will follow-up in 3 months time He is aware the need to lose 5% of body weight with the medication - Basic metabolic panel - Hepatic Function Panel - Lipid panel - Hemoglobin A1c  3. Other hyperlipidemia Check lipid profile healthy diet recommended May need to consider statin - Basic metabolic panel - Hepatic Function Panel - Lipid panel - Hemoglobin A1c  4. Hyperglycemia History of hyperglycemia check labs as well as A1c at risk of developing diabetes - Basic metabolic panel - Hepatic Function Panel - Lipid panel - Hemoglobin A1c

## 2021-07-08 NOTE — Patient Instructions (Signed)
Discussion of GLP-1 medications Please remember these medications are to assist with weight reduction and diabetes control.  They do not take the place of healthy eating and regular physical activity. There are several GLP-1 medications that can help with weight reduction and diabetes control.  GLP-1 medications with indication for diabetes-Trulicity, Victoza, Bydureon , Mounjaro, Ozempic, and Rybelsus (although these are injected medicines except for Rybelsus which is a pill) GLP-1 medications with indications for weight loss-Wegovy, and Saxenda  Mechanism of action  These medications stimulate glucagon peptide receptors.  By doing so it does the following:  1-slows down stomach emptying-essentially food takes longer to go through the stomach and the intestines-this can lessen appetite  2-reduces glucagon secretion from the pancreas-this helps keep blood glucose levels stable between meals  3-increase his insulin release from the pancreas-with diabetics this helps keep blood glucose levels stable  4-promotes the feeling of being full in the brain-encouraged him receptors in the brain received the signal so the brain and body know that it is time to stop eating Benefits of the medication  1-reduced weight-reduction of weight is more significant at higher dosing.  Not as much weight reduction with Rybelsus   A- typically Wegovy at higher doses can assist with significant weight reduction.  2-improved blood glucose levels-with diabetics typically we see a significant drop with A1c when the medication is adjusted appropriately.  3-decrease risk of heart attacks and strokes-individuals with type 2 diabetes are at increased risk of heart attacks and strokes.  These GLP-1 medications can reduce the risk by 22%.  Risk of GLP-1 medications-these are some of the risks.  It is important to talk with your provider in a shared discussion before starting any medication.  Contraindications to GLP-1  medications-in other words who should not take these.  1-individuals with history of thyroid medullary cancer  2-individuals with family history of multiple endocrine neoplasm syndrome type II (MEN 2)  3-individuals with a history of pancreatitis  4-those with a history of severe hypersensitivity or allergy reactions to GLP-1 medications  5-should be avoided in individuals with a history of suicidal attempts or active suicidal ideation.  Cautions include- 1- risk of thyroid C-cell tumors were seen in rats during clinical testing in humans the relevancy of this information has not been determined 2-risk of pancreatitis including fatal and nonfatal hemorrhagic or necrotizing pancreatitis 3-gallbladder disease slightly increased risk of gallstones 4-hypoglycemia-more common with individuals who are on insulin or sulfonylurea such as glipizide 5-acute kidney injury or worsening of chronic renal failure often this is triggered by severe vomiting and diarrhea as side effects to GLP-1. 6-slightly increased risk of diabetic retinopathy complications in patients with type 2 diabetes 7-heart rate increase-slight increase of base heart rate with GLP-1 8-suicidal behavior and ideation has been reported in clinical trials with other weight loss medicines.  If depression occurs with medication or suicidal ideation stop medication immediately notify provider or seek help.  Common side effects include nausea, vomiting, diarrhea, constipation and increased heart rate.  Less common side effects severe abdominal pain-if this occurs notify provider stop medication get tested for pancreatitis. Full discussion with your provider should occur before starting these medicines.       

## 2021-07-09 ENCOUNTER — Ambulatory Visit: Payer: Self-pay | Admitting: Family Medicine

## 2021-07-09 ENCOUNTER — Other Ambulatory Visit (HOSPITAL_COMMUNITY): Payer: Self-pay

## 2021-07-09 MED ORDER — WEGOVY 0.25 MG/0.5ML ~~LOC~~ SOAJ
SUBCUTANEOUS | 0 refills | Status: DC
  Filled 2021-07-09: qty 2, 28d supply, fill #0

## 2021-07-09 NOTE — Progress Notes (Signed)
07/09/21-Wegovy sent to pharmacy

## 2021-07-09 NOTE — Addendum Note (Signed)
Addended by: Vicente Males on: 07/09/2021 08:58 AM   Modules accepted: Orders

## 2021-07-15 ENCOUNTER — Telehealth: Payer: Self-pay | Admitting: *Deleted

## 2021-07-15 ENCOUNTER — Other Ambulatory Visit (HOSPITAL_COMMUNITY): Payer: Self-pay

## 2021-07-15 NOTE — Telephone Encounter (Signed)
Pa for [employer] approved by insurance. Approval good 07/13/21-02/01/22  Patient notified.

## 2021-08-06 ENCOUNTER — Encounter: Payer: Self-pay | Admitting: Family Medicine

## 2021-08-07 ENCOUNTER — Other Ambulatory Visit (HOSPITAL_COMMUNITY): Payer: Self-pay

## 2021-08-07 MED ORDER — WEGOVY 0.5 MG/0.5ML ~~LOC~~ SOAJ
SUBCUTANEOUS | 1 refills | Status: DC
  Filled 2021-08-07: qty 2, 28d supply, fill #0

## 2021-08-07 NOTE — Telephone Encounter (Signed)
Nurses May go ahead with Wegovy 0.5 mg once weekly, 1 month supply with 1 refill  [name] West needs to keep [place] updated.  In 3 to 4 weeks if he is doing well with this dose we need to go up to the next level  Also insurance requires a follow-up visit 3 months after starting the medicine.  This is mandatory.  Patient needs to go ahead and set up the appointment now because appointment slot still a lot.[name] West needs to lose 5% of his body weight from the time he started the medicine to the time of the 21-monthfollow-up otherwise [name] insurance will not continue to cover the medicine.  Healthy eating and portion control very important.  Fitting and extra walking very important.  If any problems with the newer dose CMalineeds to let u[place]oreaknow thanks-Dr. SNicki Reaper

## 2021-08-11 ENCOUNTER — Other Ambulatory Visit (HOSPITAL_COMMUNITY): Payer: Self-pay

## 2021-08-19 ENCOUNTER — Other Ambulatory Visit (HOSPITAL_COMMUNITY): Payer: Self-pay

## 2021-09-01 DIAGNOSIS — I10 Essential (primary) hypertension: Secondary | ICD-10-CM | POA: Diagnosis not present

## 2021-09-01 DIAGNOSIS — E7849 Other hyperlipidemia: Secondary | ICD-10-CM | POA: Diagnosis not present

## 2021-09-01 DIAGNOSIS — R739 Hyperglycemia, unspecified: Secondary | ICD-10-CM | POA: Diagnosis not present

## 2021-09-02 LAB — BASIC METABOLIC PANEL
BUN/Creatinine Ratio: 18 (ref 9–20)
BUN: 14 mg/dL (ref 6–24)
CO2: 23 mmol/L (ref 20–29)
Calcium: 9.4 mg/dL (ref 8.7–10.2)
Chloride: 103 mmol/L (ref 96–106)
Creatinine, Ser: 0.76 mg/dL (ref 0.76–1.27)
Glucose: 95 mg/dL (ref 70–99)
Potassium: 4.2 mmol/L (ref 3.5–5.2)
Sodium: 145 mmol/L — ABNORMAL HIGH (ref 134–144)
eGFR: 115 mL/min/{1.73_m2} (ref >59)

## 2021-09-02 LAB — LIPID PANEL
Chol/HDL Ratio: 4.2 ratio (ref 0.0–5.0)
Cholesterol, Total: 161 mg/dL (ref 100–199)
HDL: 38 mg/dL — ABNORMAL LOW (ref >39)
LDL Chol Calc (NIH): 109 mg/dL — ABNORMAL HIGH (ref 0–99)
Triglycerides: 75 mg/dL (ref 0–149)
VLDL Cholesterol Cal: 14 mg/dL (ref 5–40)

## 2021-09-02 LAB — HEPATIC FUNCTION PANEL
ALT: 20 IU/L (ref 0–44)
AST: 18 IU/L (ref 0–40)
Albumin: 4.6 g/dL (ref 4.1–5.1)
Alkaline Phosphatase: 102 IU/L (ref 44–121)
Bilirubin Total: 0.7 mg/dL (ref 0.0–1.2)
Bilirubin, Direct: 0.22 mg/dL (ref 0.00–0.40)
Total Protein: 7.4 g/dL (ref 6.0–8.5)

## 2021-09-02 LAB — HEMOGLOBIN A1C
Est. average glucose Bld gHb Est-mCnc: 103 mg/dL
Hgb A1c MFr Bld: 5.2 % (ref 4.8–5.6)

## 2021-09-07 ENCOUNTER — Encounter: Payer: Self-pay | Admitting: Family Medicine

## 2021-09-07 ENCOUNTER — Other Ambulatory Visit (HOSPITAL_COMMUNITY): Payer: Self-pay

## 2021-09-07 MED ORDER — WEGOVY 1 MG/0.5ML ~~LOC~~ SOAJ
SUBCUTANEOUS | 1 refills | Status: DC
  Filled 2021-09-07: qty 2, 28d supply, fill #0

## 2021-09-07 NOTE — Telephone Encounter (Signed)
Nurses Go ahead go up on dose to 1 mg 1 mg weekly, 1 month supply with 1 refill Healthy diet regular activity Keep follow-up visit in September Thanks-Dr. Hobson Lax  Hi [name] West Continue to do your best with portion control regular exercise it is important to lose 5% of your body weight in the first 3 months of taking this medicine to meet insurance requirements.  TakeCare-Dr. Nicki Reaper We will see you in September

## 2021-09-15 ENCOUNTER — Ambulatory Visit: Payer: 59 | Admitting: Family Medicine

## 2021-09-15 ENCOUNTER — Encounter: Payer: Self-pay | Admitting: Family Medicine

## 2021-09-15 DIAGNOSIS — E7849 Other hyperlipidemia: Secondary | ICD-10-CM | POA: Diagnosis not present

## 2021-09-15 DIAGNOSIS — I10 Essential (primary) hypertension: Secondary | ICD-10-CM | POA: Diagnosis not present

## 2021-09-15 NOTE — Progress Notes (Signed)
   Subjective:    Patient ID: [name] West, male    DOB: [birth year], 41 y.o.   MRN: [id number]  HPI  3 month follow up for weight loss medication currently on 0.5 mg  Has done remarkably well eating healthier staying active at work he is also active outside work he is tolerating the medicine well denies any setbacks  Morbid obesity ([place])  Other hyperlipidemia  Essential hypertension Patient's weight has come down dramatically while on the medicine Cholesterol labs were ordered and reviewed with patient A1c looks good Kidney function liver function looks good Blood pressure doing well  Review of Systems     Objective:   Physical Exam General-in no acute distress Eyes-no discharge Lungs-respiratory rate normal, CTA CV-no murmurs,RRR Extremities skin warm dry no edema Neuro grossly normal Behavior normal, alert  I will send patient a MyChart message regarding blood pressure Labs reviewed A1c great lipis better     Assessment & Plan:   1. Morbid obesity ([place]) Continue wegovy Increase to 1 mg  Weight loss 28 lbs greater than 5 %^  2. Other hyperlipidemia Improved Continue wegovy and diet and activity  3. Essential hypertension Fine for now On next visit possibly reduce meds  Follow up in 3 to 4 months  Increase wegovy monthly till at 2.4 or sx prevent

## 2021-09-16 ENCOUNTER — Other Ambulatory Visit (HOSPITAL_COMMUNITY): Payer: Self-pay

## 2021-10-09 ENCOUNTER — Other Ambulatory Visit (HOSPITAL_COMMUNITY): Payer: Self-pay

## 2021-10-09 ENCOUNTER — Encounter: Payer: Self-pay | Admitting: Family Medicine

## 2021-10-09 ENCOUNTER — Other Ambulatory Visit: Payer: Self-pay | Admitting: Family Medicine

## 2021-10-09 MED ORDER — WEGOVY 1.7 MG/0.75ML ~~LOC~~ SOAJ
SUBCUTANEOUS | 2 refills | Status: DC
  Filled 2021-10-09: qty 3, 28d supply, fill #0
  Filled 2021-11-09: qty 3, 28d supply, fill #1

## 2021-10-09 NOTE — Telephone Encounter (Signed)
Please see other my chart message. Med has been sent to pharmacy

## 2021-10-09 NOTE — Telephone Encounter (Signed)
Nurses Go ahead with 1.7 mg Wegovy taken every week May have 1 month supply with 2 refills Have [place] give me update within 3 to 4 weeks how this is going If going well we will increase it on the next dosing to 2.4 We will also see him for his follow-up visit in early December thanks  If any problems with the increased dose he should let us know thank you

## 2021-10-12 ENCOUNTER — Other Ambulatory Visit (HOSPITAL_COMMUNITY): Payer: Self-pay

## 2021-10-14 ENCOUNTER — Other Ambulatory Visit (HOSPITAL_COMMUNITY): Payer: Self-pay

## 2021-11-09 ENCOUNTER — Other Ambulatory Visit (HOSPITAL_COMMUNITY): Payer: Self-pay

## 2021-11-09 ENCOUNTER — Encounter: Payer: Self-pay | Admitting: Family Medicine

## 2021-11-09 ENCOUNTER — Other Ambulatory Visit: Payer: Self-pay | Admitting: Family Medicine

## 2021-11-09 NOTE — Progress Notes (Signed)
May increase Wegovy next dosage would be 2.4 mg subcutaneous 1 month supply with 1 refills  Keep follow-up visit we can issue further refills at follow-up visit thank you

## 2021-11-09 NOTE — Telephone Encounter (Signed)
May increase Wegovy 2.4 1 month supply with 1 refill Keep follow-up visit in December

## 2021-11-10 ENCOUNTER — Other Ambulatory Visit (HOSPITAL_COMMUNITY): Payer: Self-pay

## 2021-11-10 MED ORDER — WEGOVY 2.4 MG/0.75ML ~~LOC~~ SOAJ
2.4000 mg | SUBCUTANEOUS | 1 refills | Status: DC
  Filled 2021-11-10: qty 3, 28d supply, fill #0
  Filled 2021-12-01: qty 3, 28d supply, fill #1

## 2021-11-12 ENCOUNTER — Other Ambulatory Visit (HOSPITAL_COMMUNITY): Payer: Self-pay

## 2021-11-25 ENCOUNTER — Other Ambulatory Visit (HOSPITAL_COMMUNITY): Payer: Self-pay

## 2021-12-01 ENCOUNTER — Other Ambulatory Visit (HOSPITAL_COMMUNITY): Payer: Self-pay

## 2021-12-02 ENCOUNTER — Other Ambulatory Visit (HOSPITAL_COMMUNITY): Payer: Self-pay

## 2021-12-04 ENCOUNTER — Encounter (HOSPITAL_COMMUNITY): Payer: Self-pay | Admitting: Pharmacist

## 2021-12-04 ENCOUNTER — Other Ambulatory Visit (HOSPITAL_COMMUNITY): Payer: Self-pay

## 2021-12-10 ENCOUNTER — Encounter: Payer: Self-pay | Admitting: Family Medicine

## 2021-12-15 ENCOUNTER — Ambulatory Visit: Payer: 59 | Admitting: Family Medicine

## 2021-12-16 ENCOUNTER — Other Ambulatory Visit (HOSPITAL_COMMUNITY): Payer: Self-pay

## 2021-12-28 ENCOUNTER — Other Ambulatory Visit (HOSPITAL_COMMUNITY): Payer: Self-pay

## 2021-12-28 ENCOUNTER — Other Ambulatory Visit: Payer: Self-pay | Admitting: Family Medicine

## 2021-12-29 ENCOUNTER — Encounter: Payer: Self-pay | Admitting: Family Medicine

## 2021-12-29 MED ORDER — WEGOVY 2.4 MG/0.75ML ~~LOC~~ SOAJ
2.4000 mg | SUBCUTANEOUS | 2 refills | Status: DC
  Filled 2021-12-29: qty 3, 28d supply, fill #0
  Filled 2022-01-22: qty 3, 28d supply, fill #1

## 2021-12-30 ENCOUNTER — Other Ambulatory Visit (HOSPITAL_COMMUNITY): Payer: Self-pay

## 2021-12-30 ENCOUNTER — Other Ambulatory Visit: Payer: Self-pay

## 2022-01-22 ENCOUNTER — Other Ambulatory Visit (HOSPITAL_COMMUNITY): Payer: Self-pay

## 2022-01-26 ENCOUNTER — Other Ambulatory Visit (HOSPITAL_COMMUNITY): Payer: Self-pay

## 2022-02-01 ENCOUNTER — Encounter: Payer: Self-pay | Admitting: Family Medicine

## 2022-02-01 ENCOUNTER — Other Ambulatory Visit: Payer: Self-pay

## 2022-02-01 ENCOUNTER — Ambulatory Visit (INDEPENDENT_AMBULATORY_CARE_PROVIDER_SITE_OTHER): Payer: 59 | Admitting: Family Medicine

## 2022-02-01 VITALS — BP 132/89 | HR 57 | Temp 97.7°F | Ht 69.0 in | Wt 231.0 lb

## 2022-02-01 DIAGNOSIS — G4730 Sleep apnea, unspecified: Secondary | ICD-10-CM

## 2022-02-01 DIAGNOSIS — I10 Essential (primary) hypertension: Secondary | ICD-10-CM | POA: Diagnosis not present

## 2022-02-01 DIAGNOSIS — E7849 Other hyperlipidemia: Secondary | ICD-10-CM | POA: Diagnosis not present

## 2022-02-01 MED ORDER — SERTRALINE HCL 100 MG PO TABS
100.0000 mg | ORAL_TABLET | Freq: Every day | ORAL | 1 refills | Status: DC
  Filled 2022-02-01: qty 90, 90d supply, fill #0

## 2022-02-01 MED ORDER — WEGOVY 2.4 MG/0.75ML ~~LOC~~ SOAJ
2.4000 mg | SUBCUTANEOUS | 5 refills | Status: DC
  Filled 2022-02-01 – 2022-03-10 (×3): qty 3, 28d supply, fill #0
  Filled 2022-04-02: qty 3, 28d supply, fill #1
  Filled 2022-05-03: qty 3, 28d supply, fill #2

## 2022-02-01 MED ORDER — AMLODIPINE BESYLATE 5 MG PO TABS
5.0000 mg | ORAL_TABLET | Freq: Every day | ORAL | 1 refills | Status: DC
  Filled 2022-02-01: qty 90, 90d supply, fill #0

## 2022-02-01 MED ORDER — VALSARTAN-HYDROCHLOROTHIAZIDE 160-25 MG PO TABS
1.0000 | ORAL_TABLET | Freq: Every day | ORAL | 1 refills | Status: DC
  Filled 2022-02-01: qty 90, 90d supply, fill #0

## 2022-02-01 MED ORDER — POTASSIUM CHLORIDE CRYS ER 10 MEQ PO TBCR
10.0000 meq | EXTENDED_RELEASE_TABLET | Freq: Every day | ORAL | 1 refills | Status: DC
  Filled 2022-02-01: qty 90, 90d supply, fill #0

## 2022-02-01 NOTE — Progress Notes (Signed)
   Subjective:    Patient ID: [name] West, male    DOB: [birth year], 42 y.o.   MRN: [id number]  HPI  Follow up for hypertension and obesity , no concerns stated Patient with blood pressure issues and morbid obesity been on Wegovy is done a great job losing weight has lost 19% of his body weight.  He tolerates medicine.  Takes it on a regular basis.  Denies any severe abdominal pain vomiting or other symptoms. Review of Systems     Objective:   Physical Exam General-in no acute distress Eyes-no discharge Lungs-respiratory rate normal, CTA CV-no murmurs,RRR Extremities skin warm dry no edema Neuro grossly normal Behavior normal, alert        Assessment & Plan:  HTN good control continue current medication healthy diet regular activity weight coming down should help blood pressure come down further may be able to come off some medicines follow-up by May  Moods are doing well continue sertraline  Morbid obesity much better given that he is losing weight-continue medication-recommend follow-up office visit today with lab work before that visit

## 2022-02-02 ENCOUNTER — Other Ambulatory Visit (HOSPITAL_COMMUNITY): Payer: Self-pay

## 2022-02-02 ENCOUNTER — Other Ambulatory Visit: Payer: Self-pay

## 2022-02-02 ENCOUNTER — Encounter (HOSPITAL_COMMUNITY): Payer: Self-pay

## 2022-02-03 ENCOUNTER — Other Ambulatory Visit (HOSPITAL_COMMUNITY): Payer: Self-pay

## 2022-02-26 ENCOUNTER — Other Ambulatory Visit (HOSPITAL_COMMUNITY): Payer: Self-pay

## 2022-03-04 ENCOUNTER — Telehealth: Payer: Self-pay | Admitting: Family Medicine

## 2022-03-04 NOTE — Telephone Encounter (Signed)
PA for Mancel Parsons has been approved. Approval good from 03/02/22-03/02/23. Request has been approved for 3 mL per 28 days. My Chart message sent to patient.

## 2022-03-09 ENCOUNTER — Ambulatory Visit (INDEPENDENT_AMBULATORY_CARE_PROVIDER_SITE_OTHER): Payer: 59 | Admitting: Family Medicine

## 2022-03-09 VITALS — BP 128/84 | Wt 223.4 lb

## 2022-03-09 DIAGNOSIS — G542 Cervical root disorders, not elsewhere classified: Secondary | ICD-10-CM

## 2022-03-09 DIAGNOSIS — G5603 Carpal tunnel syndrome, bilateral upper limbs: Secondary | ICD-10-CM | POA: Diagnosis not present

## 2022-03-09 NOTE — Progress Notes (Signed)
   Subjective:    Patient ID: [name] West, male    DOB: 1980/06/13, 42 y.o.   MRN: [id number]  HPI Patient arrives today with tingling under elbow, arm pit, and ride side under arm.  The symptoms have been going on for about 6 weeks Denies weakness down the arm.  Relates a lot of sensations in the upper neck with soreness as well as her right arm and some weakness as well in the hands bilateral related to carpal tunnel patient previously had evaluation by Dr.Kuzma for carpal tunnel the Patient would like to also discuss getting setup for carpal tunnel surgery. Patient relates that the strength in his hands is not as good as they once were  Review of Systems     Objective:   Physical Exam Lungs clear heart regular slight decreased range of motion of the neck reflexes in the arms are good strength in the arms are good hands slight decrease strength in grip       Assessment & Plan:   1. Bilateral carpal tunnel syndrome Ortho referral Dr.Kuzma group for carpal tunnel possible surgery - Ambulatory referral to Orthopedics  2. Cervical nerve root impingement X-rays indicated, gentle stretching exercises, if not improving over the next 2 weeks to notify us may need MRI if it persists - DG Cervical Spine Complete

## 2022-03-10 ENCOUNTER — Other Ambulatory Visit (HOSPITAL_COMMUNITY): Payer: Self-pay

## 2022-03-11 ENCOUNTER — Ambulatory Visit (HOSPITAL_COMMUNITY)
Admission: RE | Admit: 2022-03-11 | Discharge: 2022-03-11 | Disposition: A | Discharge status: Home / Self Care | Payer: 59 | Source: Ambulatory Visit | Attending: Family Medicine | Admitting: Family Medicine

## 2022-03-11 DIAGNOSIS — G542 Cervical root disorders, not elsewhere classified: Secondary | ICD-10-CM | POA: Diagnosis not present

## 2022-03-11 DIAGNOSIS — R202 Paresthesia of skin: Secondary | ICD-10-CM | POA: Diagnosis not present

## 2022-03-23 ENCOUNTER — Telehealth: Payer: Self-pay

## 2022-03-23 DIAGNOSIS — G5603 Carpal tunnel syndrome, bilateral upper limbs: Secondary | ICD-10-CM

## 2022-03-23 NOTE — Telephone Encounter (Signed)
Pt is calling about Semaglutide-Weight Management (WEGOVY) 2.4 MG/0.75ML SOAJ [employer] [facility] no longer covers wants to know if there is something else Dr Nicki Reaper would consider approving

## 2022-03-23 NOTE — Telephone Encounter (Signed)
Loma Sousa may switch her to a [facility] health orthopedist who handles carpal tunnel

## 2022-03-23 NOTE — Telephone Encounter (Signed)
Pt is calling Pt has [place] insurance needs referral switch to [facility] provider due to insurance. Pt was seeing Dr. Fredna Dow at [facility] pt has called left message follow up on new referral to a [facility] provider for carpal tunnel   Pt call back [phone] [name] West

## 2022-03-25 NOTE — Telephone Encounter (Signed)
Referral ordered in EPIC. 

## 2022-03-27 NOTE — Telephone Encounter (Signed)
I believe it is possible that Contrave is covered-I am not [id number] certain-this is a combination of Wellbutrin with naltrexone which can suppress appetite but it is not as effective as [id number].  Also FYI I do not prescribe phenteramine for weight reduction because most individuals need a long-term medication for weight reduction and phenteramine is not suitable for long-term use Please see what they would like to do let me know thanks

## 2022-03-28 ENCOUNTER — Encounter: Payer: Self-pay | Admitting: Family Medicine

## 2022-03-28 DIAGNOSIS — G542 Cervical root disorders, not elsewhere classified: Secondary | ICD-10-CM

## 2022-03-29 ENCOUNTER — Other Ambulatory Visit: Payer: Self-pay | Admitting: Family Medicine

## 2022-03-29 MED ORDER — NALTREXONE-BUPROPION HCL ER 8-90 MG PO TB12
ORAL_TABLET | ORAL | 5 refills | Status: DC
  Filled 2022-03-29: qty 120, 47d supply, fill #0

## 2022-03-29 NOTE — Telephone Encounter (Signed)
Nurses Please go ahead with cervical MRI Diagnosis cervical nerve impingement Patient with symptoms of numbness into the arm Symptoms present for 8 weeks now

## 2022-03-29 NOTE — Telephone Encounter (Signed)
Contrave was sent in By my understanding this is covered If it is not the pharmacy will let us know It does have a gradual titration 1 tablet daily for the first 4 days then 1 twice a day for the first week then 2 in the morning and 1 in the evening for a week then and then at that point 2 in the morning 2 in the evening every day Please keep follow-ups

## 2022-03-29 NOTE — Telephone Encounter (Signed)
Wife [name] West(DPR) stated they insurance stated it will cover the Contrave and they would like it sent to [facility]

## 2022-03-30 ENCOUNTER — Other Ambulatory Visit: Payer: Self-pay

## 2022-03-30 NOTE — Telephone Encounter (Signed)
Patient notified via Mychart.

## 2022-04-02 ENCOUNTER — Other Ambulatory Visit (HOSPITAL_COMMUNITY): Payer: Self-pay

## 2022-04-07 ENCOUNTER — Ambulatory Visit: Payer: 59 | Admitting: Orthopedic Surgery

## 2022-04-14 ENCOUNTER — Ambulatory Visit: Payer: 59 | Admitting: Orthopedic Surgery

## 2022-04-14 ENCOUNTER — Encounter: Payer: Self-pay | Admitting: Orthopedic Surgery

## 2022-04-14 VITALS — BP 139/89 | HR 67 | Ht 69.0 in | Wt 222.2 lb

## 2022-04-14 DIAGNOSIS — G5602 Carpal tunnel syndrome, left upper limb: Secondary | ICD-10-CM

## 2022-04-14 DIAGNOSIS — G5601 Carpal tunnel syndrome, right upper limb: Secondary | ICD-10-CM

## 2022-04-16 NOTE — Progress Notes (Signed)
New Patient Visit  Assessment: [name] West is a 42 y.o. male with the following: Bilateral carpal tunnel syndrome.  Plan: [name] West has signs and symptoms consistent with carpal tunnel syndrome.  Left is worse than right.  This is been ongoing for several years.  He was previously set up to undergo surgery, and has had EMG tests done.  However, insurance issues forced him to delay surgery.  He is now interested in proceeding with surgery.  He would like to start with the left.  We will confirm medical clearance, and discuss timing once we hear from the patient.  All questions were answered, and the procedure was discussed in detail.  Follow-up: Return for After medical clearance for OR.  Subjective:  Chief Complaint  Patient presents with   Hand Pain    Bilat hand and wrist pain for over 6 yrs, has had more tingling in hands for 2-3 mos.  Pt was going to have procedure last yr but switched jobs.    History of Present Illness: [name] West is a 42 y.o. male who presents for evaluation of bilateral hand pain.  He has had numbness tingling, and pain in both hands for several years.  He had an EMG completed a couple of years ago.  This demonstrated mild and moderate carpal tunnel syndrome bilaterally.  He was set up to undergo a surgical release, but switched jobs, and there were issues with his insurance.  Over the past couple months, he has noticed more symptoms, primarily in the left hand.  Braces have previously been effective.  He is interested in pursuing surgery.   Review of Systems: No fevers or chills No numbness or tingling No chest pain No shortness of breath No bowel or bladder dysfunction No GI distress No headaches   Medical History:  Past Medical History:  Diagnosis Date   Anxiety    Gout    HNP (herniated nucleus pulposus), lumbar    HTN (hypertension)    Hx of low back pain    IBS (irritable bowel syndrome)    Joint pain    Kidney problem    Kidney  stones    Leg edema    Sleep apnea     Past Surgical History:  Procedure Laterality Date   BACK SURGERY     KIDNEY STONE SURGERY     LUMBAR LAMINECTOMY/DECOMPRESSION MICRODISCECTOMY Right 02/21/2018   Procedure: Right Lumbar Three-Four Lumbar Four-Five Microdiscectomy;  Surgeon: Maeola HarmanStern, Joseph, MD;  [facility];  Service: Neurosurgery;  Laterality: Right;  Right Lumbar Three-Four Lumbar Four-Five Microdiscectomy    Family History  Problem Relation Age of Onset   Cancer Mother        Lung   Hyperlipidemia Father    Heart disease Father    Kidney Stones Father    Heart disease Paternal Grandfather [id number]       died of MI   Social History   Tobacco Use   Smoking status: Never   Smokeless tobacco: Never  Vaping Use   Vaping Use: Never used  Substance Use Topics   Alcohol use: Yes    Comment: occas social   Drug use: No    No Known Allergies  No outpatient medications have been marked as taking for the 04/14/22 encounter (Office Visit) with Oliver Barreairns, Agam Davenport A, MD.    Objective: BP 139/89   Pulse 67   Ht 5\' 9"  (1.753 m)   Wt 222 lb 3.2 oz (100.8 kg)  BMI 32.81 kg/m   Physical Exam:  General: Alert and oriented. and No acute distress. Gait: Normal gait.  Lateral hands without deformity.  No thenar atrophy.  Mildly positive Tinel's bilaterally.  Positive Phalen's.  Positive Durkan's.  Good strength in the bilateral thumbs.  Good grip strength.  Fingers are warm well-perfused.   IMAGING: No new imaging obtained today   New Medications:  No orders of the defined types were placed in this encounter.     Oliver Barre, MD  04/16/2022 2:03 PM

## 2022-04-21 ENCOUNTER — Other Ambulatory Visit: Payer: Self-pay

## 2022-04-22 ENCOUNTER — Other Ambulatory Visit: Payer: Self-pay

## 2022-04-27 ENCOUNTER — Other Ambulatory Visit: Payer: Self-pay

## 2022-04-29 ENCOUNTER — Other Ambulatory Visit (HOSPITAL_COMMUNITY): Payer: Self-pay

## 2022-04-30 ENCOUNTER — Other Ambulatory Visit: Payer: Self-pay

## 2022-04-30 ENCOUNTER — Ambulatory Visit (HOSPITAL_COMMUNITY): Payer: 59

## 2022-05-03 ENCOUNTER — Other Ambulatory Visit (HOSPITAL_COMMUNITY): Payer: Self-pay

## 2022-05-05 ENCOUNTER — Other Ambulatory Visit (HOSPITAL_COMMUNITY): Payer: Self-pay

## 2022-05-10 ENCOUNTER — Other Ambulatory Visit (HOSPITAL_COMMUNITY): Payer: Self-pay

## 2022-05-12 ENCOUNTER — Ambulatory Visit (HOSPITAL_COMMUNITY)
Admission: RE | Admit: 2022-05-12 | Discharge: 2022-05-12 | Disposition: A | Discharge status: Home / Self Care | Payer: 59 | Source: Ambulatory Visit | Attending: Family Medicine | Admitting: Family Medicine

## 2022-05-12 DIAGNOSIS — M4802 Spinal stenosis, cervical region: Secondary | ICD-10-CM | POA: Diagnosis not present

## 2022-05-12 DIAGNOSIS — G542 Cervical root disorders, not elsewhere classified: Secondary | ICD-10-CM | POA: Diagnosis not present

## 2022-05-14 ENCOUNTER — Ambulatory Visit (HOSPITAL_COMMUNITY): Payer: 59

## 2022-05-17 ENCOUNTER — Encounter: Payer: Self-pay | Admitting: Family Medicine

## 2022-05-17 ENCOUNTER — Encounter: Payer: Self-pay | Admitting: Orthopedic Surgery

## 2022-05-17 DIAGNOSIS — G542 Cervical root disorders, not elsewhere classified: Secondary | ICD-10-CM

## 2022-05-17 NOTE — Telephone Encounter (Signed)
Nurses-please go ahead with urgent referral to Dr. Chilton Greathouse send notification via MyChart to [place] Thank you-Jullie Arps MD

## 2022-05-19 ENCOUNTER — Ambulatory Visit (HOSPITAL_COMMUNITY): Admission: RE | Admit: 2022-05-19 | Payer: 59 | Source: Ambulatory Visit

## 2022-05-25 DIAGNOSIS — M5412 Radiculopathy, cervical region: Secondary | ICD-10-CM | POA: Diagnosis not present

## 2022-05-25 DIAGNOSIS — G5600 Carpal tunnel syndrome, unspecified upper limb: Secondary | ICD-10-CM | POA: Diagnosis not present

## 2022-05-25 DIAGNOSIS — M5414 Radiculopathy, thoracic region: Secondary | ICD-10-CM | POA: Diagnosis not present

## 2022-05-26 ENCOUNTER — Encounter: Payer: Self-pay | Admitting: Family Medicine

## 2022-05-26 ENCOUNTER — Other Ambulatory Visit (HOSPITAL_COMMUNITY): Payer: Self-pay

## 2022-05-26 ENCOUNTER — Ambulatory Visit (INDEPENDENT_AMBULATORY_CARE_PROVIDER_SITE_OTHER): Payer: 59 | Admitting: Family Medicine

## 2022-05-26 ENCOUNTER — Other Ambulatory Visit: Payer: Self-pay

## 2022-05-26 VITALS — BP 136/84 | Ht 69.0 in | Wt 222.4 lb

## 2022-05-26 DIAGNOSIS — E7849 Other hyperlipidemia: Secondary | ICD-10-CM

## 2022-05-26 DIAGNOSIS — I10 Essential (primary) hypertension: Secondary | ICD-10-CM | POA: Diagnosis not present

## 2022-05-26 DIAGNOSIS — G4730 Sleep apnea, unspecified: Secondary | ICD-10-CM

## 2022-05-26 MED ORDER — POTASSIUM CHLORIDE CRYS ER 10 MEQ PO TBCR
10.0000 meq | EXTENDED_RELEASE_TABLET | Freq: Every day | ORAL | 1 refills | Status: DC
  Filled 2022-05-26 – 2022-06-14 (×2): qty 90, 90d supply, fill #0
  Filled 2022-11-25: qty 90, 90d supply, fill #1

## 2022-05-26 MED ORDER — SERTRALINE HCL 100 MG PO TABS
100.0000 mg | ORAL_TABLET | Freq: Every day | ORAL | 1 refills | Status: DC
  Filled 2022-05-26 – 2022-06-14 (×2): qty 90, 90d supply, fill #0
  Filled 2022-11-25: qty 90, 90d supply, fill #1

## 2022-05-26 MED ORDER — VALSARTAN-HYDROCHLOROTHIAZIDE 160-25 MG PO TABS
1.0000 | ORAL_TABLET | Freq: Every day | ORAL | 1 refills | Status: DC
  Filled 2022-05-26 – 2022-06-14 (×2): qty 90, 90d supply, fill #0
  Filled 2022-11-25: qty 90, 90d supply, fill #1

## 2022-05-26 MED ORDER — AMLODIPINE BESYLATE 5 MG PO TABS
5.0000 mg | ORAL_TABLET | Freq: Every day | ORAL | 1 refills | Status: DC
  Filled 2022-05-26 – 2022-06-14 (×2): qty 90, 90d supply, fill #0
  Filled 2022-11-25: qty 90, 90d supply, fill #1

## 2022-05-26 NOTE — Progress Notes (Unsigned)
   Subjective:    Patient ID: [name] West, male    DOB: 10-16-1980, 42 y.o.   MRN: [id number]  Hypertension This is a chronic problem. The current episode started more than 1 year ago. Risk factors for coronary artery disease include male gender. Treatments tried: norvasc, diovan. There are no compliance problems.   Essential hypertension  Sleep apnea, unspecified type  Other hyperlipidemia  Patient does use CPAP machine History of hyperlipidemia recheck the levels  History of morbid obesity has done very well with Wegovy but no longer covered by his insurance   Review of Systems     Objective:   Physical Exam General-in no acute distress Eyes-no discharge Lungs-respiratory rate normal, CTA CV-no murmurs,RRR Extremities skin warm dry no edema Neuro grossly normal Behavior normal, alert        Assessment & Plan:  1. Essential hypertension Blood pressure good control continue current meds  2. Sleep apnea, unspecified type Utilize his CPAP currently  3. Other hyperlipidemia Healthy diet recommended lab work in the near future await the results   Moderate obesity doing much better on Ozempic/Wegovy but unfortunately insurance no longer covering he will try the Contrave Follow-up physical later this year

## 2022-05-28 ENCOUNTER — Other Ambulatory Visit (HOSPITAL_COMMUNITY): Payer: Self-pay | Admitting: Neurosurgery

## 2022-05-28 DIAGNOSIS — M5414 Radiculopathy, thoracic region: Secondary | ICD-10-CM

## 2022-06-02 ENCOUNTER — Ambulatory Visit: Payer: 59 | Admitting: Family Medicine

## 2022-06-11 ENCOUNTER — Ambulatory Visit (HOSPITAL_COMMUNITY)
Admission: RE | Admit: 2022-06-11 | Discharge: 2022-06-11 | Disposition: A | Discharge status: Home / Self Care | Payer: 59 | Source: Ambulatory Visit | Attending: Neurosurgery | Admitting: Neurosurgery

## 2022-06-11 DIAGNOSIS — M5414 Radiculopathy, thoracic region: Secondary | ICD-10-CM | POA: Diagnosis not present

## 2022-06-11 DIAGNOSIS — R202 Paresthesia of skin: Secondary | ICD-10-CM | POA: Diagnosis not present

## 2022-06-15 ENCOUNTER — Other Ambulatory Visit: Payer: Self-pay

## 2022-06-22 ENCOUNTER — Other Ambulatory Visit: Payer: Self-pay | Admitting: Neurosurgery

## 2022-07-06 ENCOUNTER — Encounter (HOSPITAL_COMMUNITY): Payer: Self-pay | Admitting: Neurosurgery

## 2022-07-06 ENCOUNTER — Other Ambulatory Visit: Payer: Self-pay

## 2022-07-06 NOTE — Anesthesia Preprocedure Evaluation (Signed)
Anesthesia Evaluation  Patient identified by MRN, date of birth, ID band Patient awake    Reviewed: Allergy & Precautions, NPO status , Patient's Chart, lab work & pertinent test results  Airway Mallampati: II  TM Distance: >3 FB Neck ROM: Full    Dental no notable dental hx. (+) Teeth Intact, Dental Advisory Given   Pulmonary sleep apnea and Continuous Positive Airway Pressure Ventilation    Pulmonary exam normal breath sounds clear to auscultation       Cardiovascular hypertension, Pt. on medications Normal cardiovascular exam Rhythm:Regular Rate:Normal     Neuro/Psych    GI/Hepatic Neg liver ROS,,,  Endo/Other  Pt on Wegovy   Renal/GU negative Renal ROS     Musculoskeletal   Abdominal  (+) + obese  Peds  Hematology  (+) Blood dyscrasia, anemia Lab Results      Component                Value               Date                      WBC                      11.0 (H)            07/07/2022                HGB                      13.4                07/07/2022                HCT                      41.3                07/07/2022                MCV                      87.7                07/07/2022                PLT                      270                 07/07/2022              Anesthesia Other Findings   Reproductive/Obstetrics                             Anesthesia Physical Anesthesia Plan  ASA: 3  Anesthesia Plan: General   Post-op Pain Management: Ketamine IV* and Tylenol PO (pre-op)*   Induction: Intravenous  PONV Risk Score and Plan: 3 and Treatment may vary due to age or medical condition, Midazolam, Ondansetron and Dexamethasone  Airway Management Planned: Oral ETT and Video Laryngoscope Planned  Additional Equipment: None  Intra-op Plan:   Post-operative Plan: Extubation in OR  Informed Consent: I have reviewed the patients History and Physical, chart, labs and  discussed the procedure including the risks, benefits and alternatives for the proposed anesthesia  with the patient or authorized representative who has indicated his/her understanding and acceptance.     Dental advisory given  Plan Discussed with: CRNA and Anesthesiologist  Anesthesia Plan Comments:         Anesthesia Quick Evaluation

## 2022-07-06 NOTE — Progress Notes (Signed)
Mr. [place] Amison denies chest pain or shortness of breath.  Patient denies having any s/s of Covid in his household, also denies any known exposure to Covid. Mr. Encarnacion denies  any s/s of upper or lower respiratory in the past 8 weeks.   Mr. Advani PCP is Dr.Scott Luking. Mr Rachel Moulds has been on [facility] for weight loss, patient has losses 90 lbs.  Patient was instructed to hold [facility] for a week.  Last dose was 06/27/22

## 2022-07-06 NOTE — Progress Notes (Signed)
I spoke with Erie Noe, asked her to have Dr Wynetta Emery to sigh orders.

## 2022-07-07 ENCOUNTER — Encounter (HOSPITAL_COMMUNITY): Payer: Self-pay | Admitting: Neurosurgery

## 2022-07-07 ENCOUNTER — Other Ambulatory Visit: Payer: Self-pay

## 2022-07-07 ENCOUNTER — Ambulatory Visit (HOSPITAL_COMMUNITY)
Admission: RE | Admit: 2022-07-07 | Discharge: 2022-07-07 | Disposition: A | Discharge status: Home / Self Care | Payer: 59 | Attending: Neurosurgery | Admitting: Neurosurgery

## 2022-07-07 ENCOUNTER — Ambulatory Visit (HOSPITAL_COMMUNITY): Payer: 59

## 2022-07-07 ENCOUNTER — Ambulatory Visit (HOSPITAL_BASED_OUTPATIENT_CLINIC_OR_DEPARTMENT_OTHER): Payer: 59 | Admitting: Anesthesiology

## 2022-07-07 ENCOUNTER — Ambulatory Visit (HOSPITAL_COMMUNITY): Payer: 59 | Admitting: Anesthesiology

## 2022-07-07 ENCOUNTER — Encounter (HOSPITAL_COMMUNITY)
Admission: RE | Disposition: A | Discharge status: Home / Self Care | Payer: Self-pay | Source: Home / Self Care | Attending: Neurosurgery

## 2022-07-07 ENCOUNTER — Other Ambulatory Visit (HOSPITAL_COMMUNITY): Payer: Self-pay

## 2022-07-07 DIAGNOSIS — Z9989 Dependence on other enabling machines and devices: Secondary | ICD-10-CM

## 2022-07-07 DIAGNOSIS — G5601 Carpal tunnel syndrome, right upper limb: Secondary | ICD-10-CM

## 2022-07-07 DIAGNOSIS — M47812 Spondylosis without myelopathy or radiculopathy, cervical region: Secondary | ICD-10-CM | POA: Diagnosis not present

## 2022-07-07 DIAGNOSIS — M4722 Other spondylosis with radiculopathy, cervical region: Secondary | ICD-10-CM

## 2022-07-07 DIAGNOSIS — F419 Anxiety disorder, unspecified: Secondary | ICD-10-CM | POA: Diagnosis not present

## 2022-07-07 DIAGNOSIS — G4730 Sleep apnea, unspecified: Secondary | ICD-10-CM | POA: Insufficient documentation

## 2022-07-07 DIAGNOSIS — G9529 Other cord compression: Secondary | ICD-10-CM | POA: Insufficient documentation

## 2022-07-07 DIAGNOSIS — Z7985 Long-term (current) use of injectable non-insulin antidiabetic drugs: Secondary | ICD-10-CM | POA: Diagnosis not present

## 2022-07-07 DIAGNOSIS — Z981 Arthrodesis status: Secondary | ICD-10-CM | POA: Diagnosis not present

## 2022-07-07 DIAGNOSIS — M4802 Spinal stenosis, cervical region: Secondary | ICD-10-CM | POA: Insufficient documentation

## 2022-07-07 DIAGNOSIS — G4733 Obstructive sleep apnea (adult) (pediatric): Secondary | ICD-10-CM | POA: Diagnosis not present

## 2022-07-07 DIAGNOSIS — Z79899 Other long term (current) drug therapy: Secondary | ICD-10-CM | POA: Diagnosis not present

## 2022-07-07 DIAGNOSIS — I10 Essential (primary) hypertension: Secondary | ICD-10-CM | POA: Insufficient documentation

## 2022-07-07 DIAGNOSIS — M5126 Other intervertebral disc displacement, lumbar region: Secondary | ICD-10-CM | POA: Diagnosis not present

## 2022-07-07 HISTORY — DX: Personal history of urinary calculi: Z87.442

## 2022-07-07 HISTORY — PX: ANTERIOR CERVICAL DECOMP/DISCECTOMY FUSION: SHX1161

## 2022-07-07 HISTORY — PX: CARPAL TUNNEL RELEASE: SHX101

## 2022-07-07 LAB — CBC
HCT: 41.3 % (ref 39.0–52.0)
Hemoglobin: 13.4 g/dL (ref 13.0–17.0)
MCH: 28.5 pg (ref 26.0–34.0)
MCHC: 32.4 g/dL (ref 30.0–36.0)
MCV: 87.7 fL (ref 80.0–100.0)
Platelets: 270 10*3/uL (ref 150–400)
RBC: 4.71 MIL/uL (ref 4.22–5.81)
RDW: 14 % (ref 11.5–15.5)
WBC: 11 10*3/uL — ABNORMAL HIGH (ref 4.0–10.5)
nRBC: 0 % (ref 0.0–0.2)

## 2022-07-07 LAB — BASIC METABOLIC PANEL
Anion gap: 7 (ref 5–15)
BUN: 17 mg/dL (ref 6–20)
CO2: 28 mmol/L (ref 22–32)
Calcium: 8.9 mg/dL (ref 8.9–10.3)
Chloride: 103 mmol/L (ref 98–111)
Creatinine, Ser: 0.82 mg/dL (ref 0.61–1.24)
GFR, Estimated: >60 mL/min (ref >60)
Glucose, Bld: 96 mg/dL (ref 70–99)
Potassium: 3.9 mmol/L (ref 3.5–5.1)
Sodium: 138 mmol/L (ref 135–145)

## 2022-07-07 LAB — SURGICAL PCR SCREEN
MRSA, PCR: POSITIVE — AB
Staphylococcus aureus: POSITIVE — AB

## 2022-07-07 SURGERY — ANTERIOR CERVICAL DECOMPRESSION/DISCECTOMY FUSION 2 LEVELS
Anesthesia: General | Site: Neck | Laterality: Right

## 2022-07-07 MED ORDER — PANTOPRAZOLE SODIUM 40 MG IV SOLR: 40.0000 mg | Freq: Every day | INTRAVENOUS | Status: DC

## 2022-07-07 MED ORDER — THROMBIN 5000 UNITS EX SOLR
CUTANEOUS | Status: DC | PRN
  Administered 2022-07-07 (×2): 5000 [IU] via TOPICAL

## 2022-07-07 MED ORDER — ONDANSETRON HCL 4 MG/2ML IJ SOLN: 4.0000 mg | Freq: Four times a day (QID) | INTRAMUSCULAR | Status: DC | PRN

## 2022-07-07 MED ORDER — LIDOCAINE 2% (20 MG/ML) 5 ML SYRINGE
INTRAMUSCULAR | Status: DC | PRN
  Administered 2022-07-07: 100 mg via INTRAVENOUS

## 2022-07-07 MED ORDER — IRBESARTAN 150 MG PO TABS: 150.0000 mg | ORAL_TABLET | Freq: Every day | ORAL | Status: DC

## 2022-07-07 MED ORDER — ACETAMINOPHEN 325 MG PO TABS: 650.0000 mg | ORAL_TABLET | ORAL | Status: DC | PRN

## 2022-07-07 MED ORDER — VANCOMYCIN HCL 1250 MG/250ML IV SOLN
1250.0000 mg | Freq: Two times a day (BID) | INTRAVENOUS | Status: DC
  Filled 2022-07-07: qty 250

## 2022-07-07 MED ORDER — ACETAMINOPHEN 10 MG/ML IV SOLN: 1000.0000 mg | Freq: Once | INTRAVENOUS | Status: DC | PRN

## 2022-07-07 MED ORDER — MUPIROCIN 2 % EX OINT: 1.0000 | TOPICAL_OINTMENT | Freq: Two times a day (BID) | CUTANEOUS | Status: DC

## 2022-07-07 MED ORDER — THROMBIN 5000 UNITS EX SOLR: OROMUCOSAL | Status: DC | PRN

## 2022-07-07 MED ORDER — KETAMINE HCL 10 MG/ML IJ SOLN
INTRAMUSCULAR | Status: DC | PRN
  Administered 2022-07-07: 30 mg via INTRAVENOUS

## 2022-07-07 MED ORDER — AMLODIPINE BESYLATE 5 MG PO TABS: 5.0000 mg | ORAL_TABLET | Freq: Every day | ORAL | Status: DC

## 2022-07-07 MED ORDER — FENTANYL CITRATE (PF) 250 MCG/5ML IJ SOLN
INTRAMUSCULAR | Status: DC | PRN
  Administered 2022-07-07: 50 ug via INTRAVENOUS
  Administered 2022-07-07: 100 ug via INTRAVENOUS
  Administered 2022-07-07: 50 ug via INTRAVENOUS

## 2022-07-07 MED ORDER — DEXAMETHASONE SODIUM PHOSPHATE 10 MG/ML IJ SOLN
INTRAMUSCULAR | Status: DC | PRN
  Administered 2022-07-07: 10 mg via INTRAVENOUS

## 2022-07-07 MED ORDER — HYDROMORPHONE HCL 1 MG/ML IJ SOLN: 0.2500 mg | INTRAMUSCULAR | Status: DC | PRN

## 2022-07-07 MED ORDER — ACETAMINOPHEN 650 MG RE SUPP: 650.0000 mg | RECTAL | Status: DC | PRN

## 2022-07-07 MED ORDER — LIDOCAINE-EPINEPHRINE 1 %-1:100000 IJ SOLN
INTRAMUSCULAR | Status: DC | PRN
  Administered 2022-07-07: 5 mL

## 2022-07-07 MED ORDER — CEFAZOLIN SODIUM-DEXTROSE 2-4 GM/100ML-% IV SOLN: 2.0000 g | Freq: Three times a day (TID) | INTRAVENOUS | Status: DC

## 2022-07-07 MED ORDER — LACTATED RINGERS IV SOLN: INTRAVENOUS | Status: DC

## 2022-07-07 MED ORDER — POTASSIUM CHLORIDE CRYS ER 10 MEQ PO TBCR: 10.0000 meq | EXTENDED_RELEASE_TABLET | Freq: Every day | ORAL | Status: DC

## 2022-07-07 MED ORDER — DEXAMETHASONE SODIUM PHOSPHATE 10 MG/ML IJ SOLN
INTRAMUSCULAR | Status: AC
  Filled 2022-07-07: qty 1

## 2022-07-07 MED ORDER — HYDROCODONE-ACETAMINOPHEN 5-325 MG PO TABS
1.0000 | ORAL_TABLET | ORAL | 0 refills | Status: DC | PRN
  Filled 2022-07-07 (×2): qty 30, 5d supply, fill #0

## 2022-07-07 MED ORDER — KETAMINE HCL 50 MG/5ML IJ SOSY
PREFILLED_SYRINGE | INTRAMUSCULAR | Status: AC
  Filled 2022-07-07: qty 5

## 2022-07-07 MED ORDER — ALUM & MAG HYDROXIDE-SIMETH 200-200-20 MG/5ML PO SUSP: 30.0000 mL | Freq: Four times a day (QID) | ORAL | Status: DC | PRN

## 2022-07-07 MED ORDER — ROCURONIUM BROMIDE 10 MG/ML (PF) SYRINGE
PREFILLED_SYRINGE | INTRAVENOUS | Status: DC | PRN
  Administered 2022-07-07: 20 mg via INTRAVENOUS
  Administered 2022-07-07: 80 mg via INTRAVENOUS

## 2022-07-07 MED ORDER — SERTRALINE HCL 100 MG PO TABS: 100.0000 mg | ORAL_TABLET | Freq: Every day | ORAL | Status: DC

## 2022-07-07 MED ORDER — SUGAMMADEX SODIUM 200 MG/2ML IV SOLN
INTRAVENOUS | Status: DC | PRN
  Administered 2022-07-07: 200 mg via INTRAVENOUS

## 2022-07-07 MED ORDER — CHLORHEXIDINE GLUCONATE 0.12 % MT SOLN
15.0000 mL | Freq: Once | OROMUCOSAL | Status: AC
  Administered 2022-07-07: 15 mL via OROMUCOSAL
  Filled 2022-07-07: qty 15

## 2022-07-07 MED ORDER — SODIUM CHLORIDE 0.9 % IV SOLN: 250.0000 mL | INTRAVENOUS | Status: DC

## 2022-07-07 MED ORDER — CHLORHEXIDINE GLUCONATE CLOTH 2 % EX PADS: 6.0000 | MEDICATED_PAD | Freq: Every day | CUTANEOUS | Status: DC

## 2022-07-07 MED ORDER — CYCLOBENZAPRINE HCL 10 MG PO TABS
10.0000 mg | ORAL_TABLET | Freq: Three times a day (TID) | ORAL | Status: DC | PRN
  Administered 2022-07-07: 10 mg via ORAL
  Filled 2022-07-07: qty 1

## 2022-07-07 MED ORDER — ORAL CARE MOUTH RINSE: 15.0000 mL | Freq: Once | OROMUCOSAL | Status: AC

## 2022-07-07 MED ORDER — HYDROMORPHONE HCL 1 MG/ML IJ SOLN: 0.5000 mg | INTRAMUSCULAR | Status: DC | PRN

## 2022-07-07 MED ORDER — VALSARTAN-HYDROCHLOROTHIAZIDE 160-25 MG PO TABS: 1.0000 | ORAL_TABLET | Freq: Every day | ORAL | Status: DC

## 2022-07-07 MED ORDER — HYDROCHLOROTHIAZIDE 25 MG PO TABS: 25.0000 mg | ORAL_TABLET | Freq: Every day | ORAL | Status: DC

## 2022-07-07 MED ORDER — PHENOL 1.4 % MT LIQD: 1.0000 | OROMUCOSAL | Status: DC | PRN

## 2022-07-07 MED ORDER — ONDANSETRON HCL 4 MG/2ML IJ SOLN
INTRAMUSCULAR | Status: AC
  Filled 2022-07-07: qty 2

## 2022-07-07 MED ORDER — SODIUM CHLORIDE 0.9% FLUSH: 3.0000 mL | Freq: Two times a day (BID) | INTRAVENOUS | Status: DC

## 2022-07-07 MED ORDER — ADULT MULTIVITAMIN W/MINERALS CH: 1.0000 | ORAL_TABLET | Freq: Every day | ORAL | Status: DC

## 2022-07-07 MED ORDER — PROPOFOL 10 MG/ML IV BOLUS
INTRAVENOUS | Status: DC | PRN
  Administered 2022-07-07: 190 mg via INTRAVENOUS

## 2022-07-07 MED ORDER — ONDANSETRON HCL 4 MG/2ML IJ SOLN
INTRAMUSCULAR | Status: DC | PRN
  Administered 2022-07-07: 4 mg via INTRAVENOUS

## 2022-07-07 MED ORDER — CYCLOBENZAPRINE HCL 10 MG PO TABS
10.0000 mg | ORAL_TABLET | Freq: Three times a day (TID) | ORAL | 0 refills | Status: DC | PRN
  Filled 2022-07-07 (×2): qty 30, 10d supply, fill #0

## 2022-07-07 MED ORDER — VANCOMYCIN HCL 1500 MG/300ML IV SOLN
1500.0000 mg | Freq: Once | INTRAVENOUS | Status: AC
  Administered 2022-07-07: 1500 mg via INTRAVENOUS
  Filled 2022-07-07: qty 300

## 2022-07-07 MED ORDER — ONDANSETRON HCL 4 MG PO TABS: 4.0000 mg | ORAL_TABLET | Freq: Four times a day (QID) | ORAL | Status: DC | PRN

## 2022-07-07 MED ORDER — OXYCODONE HCL 5 MG/5ML PO SOLN: 5.0000 mg | Freq: Once | ORAL | Status: DC | PRN

## 2022-07-07 MED ORDER — EPHEDRINE SULFATE-NACL 50-0.9 MG/10ML-% IV SOSY
PREFILLED_SYRINGE | INTRAVENOUS | Status: DC | PRN
  Administered 2022-07-07 (×2): 5 mg via INTRAVENOUS

## 2022-07-07 MED ORDER — FENTANYL CITRATE (PF) 250 MCG/5ML IJ SOLN
INTRAMUSCULAR | Status: AC
  Filled 2022-07-07: qty 5

## 2022-07-07 MED ORDER — AMISULPRIDE (ANTIEMETIC) 5 MG/2ML IV SOLN: 10.0000 mg | Freq: Once | INTRAVENOUS | Status: DC | PRN

## 2022-07-07 MED ORDER — 0.9 % SODIUM CHLORIDE (POUR BTL) OPTIME
TOPICAL | Status: DC | PRN
  Administered 2022-07-07: 1000 mL

## 2022-07-07 MED ORDER — OXYCODONE HCL 5 MG PO TABS: 5.0000 mg | ORAL_TABLET | Freq: Once | ORAL | Status: DC | PRN

## 2022-07-07 MED ORDER — BACITRACIN ZINC 500 UNIT/GM EX OINT
TOPICAL_OINTMENT | CUTANEOUS | Status: DC | PRN
  Administered 2022-07-07: 1 via TOPICAL

## 2022-07-07 MED ORDER — MENTHOL 3 MG MT LOZG: 1.0000 | LOZENGE | OROMUCOSAL | Status: DC | PRN

## 2022-07-07 MED ORDER — LIDOCAINE-EPINEPHRINE 1 %-1:100000 IJ SOLN
INTRAMUSCULAR | Status: AC
  Filled 2022-07-07: qty 1

## 2022-07-07 MED ORDER — ONDANSETRON HCL 4 MG/2ML IJ SOLN: 4.0000 mg | Freq: Once | INTRAMUSCULAR | Status: DC | PRN

## 2022-07-07 MED ORDER — HYDROCODONE-ACETAMINOPHEN 5-325 MG PO TABS
2.0000 | ORAL_TABLET | ORAL | Status: DC | PRN
  Administered 2022-07-07: 2 via ORAL
  Filled 2022-07-07: qty 2

## 2022-07-07 MED ORDER — THROMBIN 5000 UNITS EX SOLR
CUTANEOUS | Status: AC
  Filled 2022-07-07: qty 15000

## 2022-07-07 MED ORDER — MIDAZOLAM HCL 2 MG/2ML IJ SOLN
INTRAMUSCULAR | Status: DC | PRN
  Administered 2022-07-07: 2 mg via INTRAVENOUS

## 2022-07-07 MED ORDER — PROPOFOL 10 MG/ML IV BOLUS
INTRAVENOUS | Status: AC
  Filled 2022-07-07: qty 20

## 2022-07-07 MED ORDER — MIDAZOLAM HCL 2 MG/2ML IJ SOLN
INTRAMUSCULAR | Status: AC
  Filled 2022-07-07: qty 2

## 2022-07-07 MED ORDER — SODIUM CHLORIDE 0.9% FLUSH: 3.0000 mL | INTRAVENOUS | Status: DC | PRN

## 2022-07-07 SURGICAL SUPPLY — 93 items
ADH SKN CLS APL DERMABOND .7 (GAUZE/BANDAGES/DRESSINGS)
APL SKNCLS NONHYPOALLERGENIC (GAUZE/BANDAGES/DRESSINGS) ×2
APL SKNCLS STERI-STRIP NONHPOA (GAUZE/BANDAGES/DRESSINGS) ×2
BAG COUNTER SPONGE SURGICOUNT (BAG) ×4 IMPLANT
BAG SPNG CNTER NS LX DISP (BAG) ×4
BASKET BONE COLLECTION (BASKET) ×2 IMPLANT
BENZOIN TINCTURE PRP APPL 2/3 (GAUZE/BANDAGES/DRESSINGS) ×2 IMPLANT
BIT DRILL NEURO 2X3.1 SFT TUCH (MISCELLANEOUS) ×2 IMPLANT
BLADE SURG 15 STRL LF DISP TIS (BLADE) ×2 IMPLANT
BLADE SURG 15 STRL SS (BLADE) ×2
BNDG CMPR 5X3 KNIT ELC UNQ LF (GAUZE/BANDAGES/DRESSINGS) ×2
BNDG ELASTIC 3INX 5YD STR LF (GAUZE/BANDAGES/DRESSINGS) ×2 IMPLANT
BNDG GAUZE DERMACEA FLUFF 4 (GAUZE/BANDAGES/DRESSINGS) ×2 IMPLANT
BNDG GZE DERMACEA 4 6PLY (GAUZE/BANDAGES/DRESSINGS) ×2
BONE CC-ACS 11X14 X8 6D (Bone Implant) ×2 IMPLANT
BONE CC-ACS 11X14X7 6D (Bone Implant) ×2 IMPLANT
BUR MATCHSTICK NEURO 3.0 LAGG (BURR) ×2 IMPLANT
CABLE BIPOLOR RESECTION CORD (MISCELLANEOUS) ×2 IMPLANT
CANISTER SUCT 3000ML PPV (MISCELLANEOUS) ×4 IMPLANT
CHIPS BONE CANC-ACS 11X14X8 6D (Bone Implant) IMPLANT
CHIPS BONE CANC-ACS11X14X7 6D (Bone Implant) IMPLANT
DERMABOND ADVANCED .7 DNX12 (GAUZE/BANDAGES/DRESSINGS) IMPLANT
DRAPE C-ARM 42X72 X-RAY (DRAPES) ×4 IMPLANT
DRAPE EXTREMITY T 121X128X90 (DISPOSABLE) ×2 IMPLANT
DRAPE HALF SHEET 40X57 (DRAPES) ×2 IMPLANT
DRAPE LAPAROTOMY 100X72 PEDS (DRAPES) ×2 IMPLANT
DRAPE MICROSCOPE SLANT 54X150 (MISCELLANEOUS) ×2 IMPLANT
DRILL NEURO 2X3.1 SOFT TOUCH (MISCELLANEOUS) ×2
DURAPREP 6ML APPLICATOR 50/CS (WOUND CARE) ×2 IMPLANT
ELECT COATED BLADE 2.86 ST (ELECTRODE) ×2 IMPLANT
ELECT REM PT RETURN 9FT ADLT (ELECTROSURGICAL) ×2
ELECTRODE REM PT RTRN 9FT ADLT (ELECTROSURGICAL) ×2 IMPLANT
GAUZE 4X4 16PLY ~~LOC~~+RFID DBL (SPONGE) ×2 IMPLANT
GAUZE SPONGE 4X4 12PLY STRL (GAUZE/BANDAGES/DRESSINGS) ×4 IMPLANT
GLOVE BIO SURGEON STRL SZ 6.5 (GLOVE) IMPLANT
GLOVE BIO SURGEON STRL SZ7 (GLOVE) IMPLANT
GLOVE BIO SURGEON STRL SZ7.5 (GLOVE) IMPLANT
GLOVE BIO SURGEON STRL SZ8 (GLOVE) ×4 IMPLANT
GLOVE BIO SURGEON STRL SZ8.5 (GLOVE) IMPLANT
GLOVE BIOGEL M 8.0 STRL (GLOVE) IMPLANT
GLOVE BIOGEL PI IND STRL 7.0 (GLOVE) IMPLANT
GLOVE ECLIPSE 6.5 STRL STRAW (GLOVE) IMPLANT
GLOVE ECLIPSE 7.0 STRL STRAW (GLOVE) IMPLANT
GLOVE ECLIPSE 7.5 STRL STRAW (GLOVE) IMPLANT
GLOVE ECLIPSE 8.0 STRL XLNG CF (GLOVE) IMPLANT
GLOVE ECLIPSE 8.5 STRL (GLOVE) IMPLANT
GLOVE EXAM NITRILE XL STR (GLOVE) IMPLANT
GLOVE INDICATOR 6.5 STRL GRN (GLOVE) IMPLANT
GLOVE INDICATOR 7.0 STRL GRN (GLOVE) IMPLANT
GLOVE INDICATOR 7.5 STRL GRN (GLOVE) IMPLANT
GLOVE INDICATOR 8.0 STRL GRN (GLOVE) IMPLANT
GLOVE INDICATOR 8.5 STRL (GLOVE) ×8 IMPLANT
GLOVE OPTIFIT SS 8.0 STRL (GLOVE) IMPLANT
GLOVE SURG SS PI 6.5 STRL IVOR (GLOVE) IMPLANT
GOWN STRL REUS W/ TWL LRG LVL3 (GOWN DISPOSABLE) ×2 IMPLANT
GOWN STRL REUS W/ TWL XL LVL3 (GOWN DISPOSABLE) ×4 IMPLANT
GOWN STRL REUS W/TWL 2XL LVL3 (GOWN DISPOSABLE) ×2 IMPLANT
GOWN STRL REUS W/TWL LRG LVL3 (GOWN DISPOSABLE) ×2
GOWN STRL REUS W/TWL XL LVL3 (GOWN DISPOSABLE) ×4
HALTER HD/CHIN CERV TRACTION D (MISCELLANEOUS) ×2 IMPLANT
HEMOSTAT POWDER KIT SURGIFOAM (HEMOSTASIS) ×2 IMPLANT
KIT BASIN OR (CUSTOM PROCEDURE TRAY) ×4 IMPLANT
KIT TURNOVER KIT B (KITS) ×4 IMPLANT
NDL HYPO 25X1 1.5 SAFETY (NEEDLE) IMPLANT
NDL SPNL 20GX3.5 QUINCKE YW (NEEDLE) ×2 IMPLANT
NEEDLE HYPO 25X1 1.5 SAFETY (NEEDLE) IMPLANT
NEEDLE SPNL 20GX3.5 QUINCKE YW (NEEDLE) ×2 IMPLANT
NS IRRIG 1000ML POUR BTL (IV SOLUTION) ×4 IMPLANT
PACK BASIC III (CUSTOM PROCEDURE TRAY) ×2
PACK LAMINECTOMY NEURO (CUSTOM PROCEDURE TRAY) ×2 IMPLANT
PACK SRG BSC III STRL LF ECLPS (CUSTOM PROCEDURE TRAY) ×2 IMPLANT
PAD ARMBOARD 7.5X6 YLW CONV (MISCELLANEOUS) ×10 IMPLANT
PIN DISTRACTION 14MM (PIN) IMPLANT
PLATE CERV RES 34 2L (Plate) IMPLANT
POSITIONER HAND ALUMI LG (MISCELLANEOUS) ×2 IMPLANT
SCREW VA SD RESONATE 4.2X14 (Screw) IMPLANT
SOL ELECTROSURG ANTI STICK (MISCELLANEOUS) ×4
SOLUTION ELECTROSURG ANTI STCK (MISCELLANEOUS) ×4 IMPLANT
SPIKE FLUID TRANSFER (MISCELLANEOUS) ×2 IMPLANT
SPONGE INTESTINAL PEANUT (DISPOSABLE) ×2 IMPLANT
SPONGE SURGIFOAM ABS GEL SZ50 (HEMOSTASIS) ×2 IMPLANT
STOCKINETTE 4X48 STRL (DRAPES) ×2 IMPLANT
STRIP CLOSURE SKIN 1/2X4 (GAUZE/BANDAGES/DRESSINGS) ×2 IMPLANT
SUT ETHILON 3 0 PS 1 (SUTURE) ×2 IMPLANT
SUT VIC AB 3-0 SH 8-18 (SUTURE) ×2 IMPLANT
SUT VICRYL 4-0 PS2 18IN ABS (SUTURE) ×2 IMPLANT
SYR BULB EAR ULCER 3OZ GRN STR (SYRINGE) ×2 IMPLANT
SYR CONTROL 10ML LL (SYRINGE) IMPLANT
TAPE CLOTH 4X10 WHT NS (GAUZE/BANDAGES/DRESSINGS) ×2 IMPLANT
TOWEL GREEN STERILE (TOWEL DISPOSABLE) ×4 IMPLANT
TOWEL GREEN STERILE FF (TOWEL DISPOSABLE) ×4 IMPLANT
UNDERPAD 30X36 HEAVY ABSORB (UNDERPADS AND DIAPERS) ×2 IMPLANT
WATER STERILE IRR 1000ML POUR (IV SOLUTION) ×4 IMPLANT

## 2022-07-07 NOTE — Anesthesia Postprocedure Evaluation (Signed)
Anesthesia Post Note  Patient: [name] West  Procedure(s) Performed: ACDF - C5-C6 - C6-C7 (Neck) RIGHT CARPAL TUNNEL RELEASE (Right: Hand)     Patient location during evaluation: PACU Anesthesia Type: General Level of consciousness: awake and alert Pain management: pain level controlled Vital Signs Assessment: post-procedure vital signs reviewed and stable Respiratory status: spontaneous breathing, nonlabored ventilation, respiratory function stable and patient connected to nasal cannula oxygen Cardiovascular status: blood pressure returned to baseline and stable Postop Assessment: no apparent nausea or vomiting Anesthetic complications: no   No notable events documented.  Last Vitals:  Vitals:   07/07/22 1115 07/07/22 1136  BP: (!) 148/74 (!) 145/82  Pulse: (!) 55 (!) 55  Resp: 14 18  Temp: 36.7 C   SpO2: 99% 94%    Last Pain:  Vitals:   07/07/22 1320  PainSc: 6                  [name]

## 2022-07-07 NOTE — Evaluation (Signed)
Occupational Therapy Evaluation Patient Details Name: [name] West MRN: [id number] DOB: 1980-06-21 Today's Date: 07/07/2022   History of Present Illness 42 y.o. male s/p ACDF C5-7 and R carpal tunnel release. PMH significant for gout, Lumbar HNP, HTN.   Clinical Impression   PTA, pt lived with family and was independent, driving, and working. Upon eval, pt requiring mod I for ADL. Pt educated and demonstrating compensatory techniques for bed mobility, UB ADL, LB ADL, brace application, toileting, shower transfers, grooming, car transfers, and stair training within precautions. Reviewed recommendations of frequent mobility. All education provided and questions answered. Using RUE this session functionally, but observed with weakness and unable to oppose thumb to 5th digit at time of eval. Recommending follow physicians orders for discharge due to pt also with carpal tunnel surgery. OT to sign off. Please re-consult if change in status.       Recommendations for follow up therapy are one component of a multi-disciplinary discharge planning process, led by the attending physician.  Recommendations may be updated based on patient status, additional functional criteria and insurance authorization.   Assistance Recommended at Discharge PRN  Patient can return home with the following Assist for transportation    Functional Status Assessment  Patient has had a recent decline in their functional status and demonstrates the ability to make significant improvements in function in a reasonable and predictable amount of time.  Equipment Recommendations  BSC/3in1    Recommendations for Other Services       Precautions / Restrictions Precautions Precautions: Cervical Precaution Booklet Issued: Yes (comment) Precaution Comments: All precautions reviewed within the context of ADL Restrictions Weight Bearing Restrictions: No      Mobility Bed Mobility Overal bed mobility: Modified Independent              General bed mobility comments: Incr time after initial education    Transfers Overall transfer level: Modified independent Equipment used: None                      Balance Overall balance assessment: Modified Independent                                         ADL either performed or assessed with clinical judgement   ADL Overall ADL's : Modified independent                                       General ADL Comments: incresaed time and use of compensatory techniques     Vision Baseline Vision/History: 0 No visual deficits Ability to See in Adequate Light: 0 Adequate Patient Visual Report: No change from baseline Vision Assessment?: No apparent visual deficits     Perception Perception Perception Tested?: No   Praxis Praxis Praxis tested?: Within functional limits    Pertinent Vitals/Pain Pain Assessment Pain Assessment: Faces Faces Pain Scale: Hurts little more Pain Location: operative sites Pain Descriptors / Indicators: Heaviness, Operative site guarding, Numbness (numbness in R hand) Pain Intervention(s): Limited activity within patient's tolerance, Monitored during session     Hand Dominance     Extremity/Trunk Assessment Upper Extremity Assessment Upper Extremity Assessment: RUE deficits/detail RUE Deficits / Details: s/p carpal tunnel surgery. Unable to oppose thmb to 5th digit   Lower Extremity Assessment Lower Extremity Assessment: Overall [facility]  for tasks assessed   Cervical / Trunk Assessment Cervical / Trunk Assessment: Neck Surgery   Communication Communication Communication: No difficulties   Cognition Arousal/Alertness: Awake/alert Behavior During Therapy: WFL for tasks assessed/performed Overall Cognitive Status: Within Functional Limits for tasks assessed                                       General Comments  VSS wife and dad present    Exercises     Shoulder  Instructions      Home Living Family/patient expects to be discharged to:: Private residence Living Arrangements: Spouse/significant other;Children (5 and 36 y.o. 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        OT Problem List: Decreased strength;Decreased activity tolerance;Impaired UE functional use;Pain;Decreased knowledge of precautions      OT Treatment/Interventions:      OT Goals(Current goals can be found in the care plan section) Acute Rehab OT Goals Patient Stated Goal: go home OT Goal Formulation: With patient/family  OT Frequency:      Co-evaluation              AM-PAC OT "6 Clicks" Daily Activity     Outcome Measure Help from another person eating meals?: None Help from another person taking care of personal grooming?: None Help from another person toileting, which includes using toliet, bedpan, or urinal?: None Help from another person bathing (including washing, rinsing, drying)?: None Help from another person to put on and taking off regular upper body clothing?: None Help from another person to put on and taking off regular lower body clothing?: None 6 Click Score: 24   End of Session Equipment Utilized During Treatment: Gait belt;Rolling walker (2 wheels);Cervical collar Nurse Communication: Mobility status;Other (comment) (Pt and wife Banker) report new numbness in R hand digits 1-3 and are curious if ace bandaging is too tight.)  Activity Tolerance: Patient  tolerated treatment well Patient left: in bed;with call bell/phone within reach;with family/visitor present  OT Visit Diagnosis: Muscle weakness (generalized) (M62.81);Pain Pain - part of body:  (operative site)                Time: 8295-6213 OT Time Calculation (min): 22 min Charges:  OT General Charges $OT Visit: 1 Visit OT Evaluation $OT Eval Low Complexity: 1 Low  Tyler Deis, OTR/L [facility] Acute Rehabilitation Office: [phone]   Myrla Halsted 07/07/2022, 3:39 PM

## 2022-07-07 NOTE — Plan of Care (Signed)
Pt doing well. Pt and family given D/C instructions with verbal understanding. Rx's were sent to the pharmacy by MD. Pt's incision is clean and dry with no sign of infection. Pt's IV was removed prior to D/C. Pt received 3-n-1 from [employer] per MD order. Pt D/C'd home via wheelchair per MD order. Pt is stable @ D/C and has no other needs at this time. Rema Fendt, RN

## 2022-07-07 NOTE — Anesthesia Procedure Notes (Signed)
Procedure Name: Intubation Date/Time: 07/07/2022 7:53 AM  Performed by: Rosiland Oz, CRNAPre-anesthesia Checklist: Emergency Drugs available, Patient identified, Suction available, Patient being monitored and Timeout performed Patient Re-evaluated:Patient Re-evaluated prior to induction Oxygen Delivery Method: Circle system utilized Preoxygenation: Pre-oxygenation with 100% oxygen Induction Type: IV induction Ventilation: Mask ventilation without difficulty Laryngoscope Size: Glidescope and 4 Grade View: Grade I Tube type: Oral Tube size: 7.5 mm Number of attempts: 1 Airway Equipment and Method: Stylet Placement Confirmation: ETT inserted through vocal cords under direct vision, positive ETCO2 and breath sounds checked- equal and bilateral Secured at: 22 cm Tube secured with: Tape Dental Injury: Teeth and Oropharynx as per pre-operative assessment

## 2022-07-07 NOTE — Discharge Summary (Signed)
Physician Discharge Summary  Patient ID: [name] West MRN: [id number] DOB/AGE: [birth year] 42 y.o.  Admit date: 07/07/2022 Discharge date: 07/07/2022  Admission Diagnoses: Cervical spondylosis with stenosis spinal cord compression and radiculopathy C5-6 C6-7 and right carpal tunnel syndrome.      Discharge Diagnoses: same   Discharged Condition: good  Hospital Course: The patient was admitted on 07/07/2022 and taken to the operating room where the patient underwent acdf C5-6, C6-7 and right CTR. The patient tolerated the procedure well and was taken to the recovery room and then to the floor in stable condition. The hospital course was routine. There were no complications. The wound remained clean dry and intact. Pt had appropriate neck soreness. No complaints of arm pain or new N/T/W. The patient remained afebrile with stable vital signs, and tolerated a regular diet. The patient continued to increase activities, and pain was well controlled with oral pain medications.   Consults: None  Significant Diagnostic Studies:  Results for orders placed or performed during the hospital encounter of 07/07/22  Surgical pcr screen   Specimen: Nasal Mucosa; Nasal Swab  Result Value Ref Range   MRSA, PCR POSITIVE (A) NEGATIVE   Staphylococcus aureus POSITIVE (A) NEGATIVE  Basic metabolic panel per protocol  Result Value Ref Range   Sodium 138 135 - 145 mmol/L   Potassium 3.9 3.5 - 5.1 mmol/L   Chloride 103 98 - 111 mmol/L   CO2 28 22 - 32 mmol/L   Glucose, Bld 96 70 - 99 mg/dL   BUN 17 6 - 20 mg/dL   Creatinine, Ser 9.93 0.61 - 1.24 mg/dL   Calcium 8.9 8.9 - 71.6 mg/dL   GFR, Estimated >96 >78 mL/min   Anion gap 7 5 - 15  CBC per protocol  Result Value Ref Range   WBC 11.0 (H) 4.0 - 10.5 K/uL   RBC 4.71 4.22 - 5.81 MIL/uL   Hemoglobin 13.4 13.0 - 17.0 g/dL   HCT 93.8 10.1 - 75.1 %   MCV 87.7 80.0 - 100.0 fL   MCH 28.5 26.0 - 34.0 pg   MCHC 32.4 30.0 - 36.0 g/dL   RDW 02.5 85.2 - 77.8  %   Platelets 270 150 - 400 K/uL   nRBC 0.0 0.0 - 0.2 %    DG Cervical Spine 1 View  Result Date: 07/07/2022 CLINICAL DATA:  C5-C6 C6-C7 ACDF.  Elective surgery. EXAM: DG CERVICAL SPINE - 1 VIEW COMPARISON:  None Available. FINDINGS: Fluoro time: 5 seconds. Radiation dose: 0.75 mGy Two C-arm fluoroscopic images were obtained intraoperatively and submitted for post operative interpretation. The first image demonstrates needle anteriorly at the C5-C6 disc space. Second image demonstrates anterior plate screw at E4-M3 and likely C6-C7 although the inferior extent is poorly visualized. Please see the performing provider's procedural report for further detail. IMPRESSION: Intraoperative fluoroscopy, described above. Electronically Signed   By: Feliberto Harts M.D.   On: 07/07/2022 10:44   DG C-Arm 1-60 Min-No Report  Result Date: 07/07/2022 Fluoroscopy was utilized by the requesting physician.  No radiographic interpretation.   DG C-Arm 1-60 Min-No Report  Result Date: 07/07/2022 Fluoroscopy was utilized by the requesting physician.  No radiographic interpretation.   MR THORACIC SPINE WO CONTRAST  Result Date: 06/21/2022 CLINICAL DATA:  Tingling on the right side of the arm and chest for 12 weeks. EXAM: MRI THORACIC SPINE WITHOUT CONTRAST TECHNIQUE: Multiplanar, multisequence MR imaging of the thoracic spine was performed. No intravenous contrast was administered. COMPARISON:  No direct  comparison study available. FINDINGS: Alignment: There is mild dextrocurvature centered at T8-T9. There is no antero or retrolisthesis. Vertebrae: Vertebral body heights are preserved. Background marrow signal is somewhat hypointense on the T1 images which is nonspecific but can be seen in the setting of obesity, smoking, anemia, or less likely infiltrative marrow process. There is no suspicious marrow signal abnormality. There is mild edema in the right posterior elements at T9-T10. Cord:  Normal in signal and  morphology. Paraspinal and other soft tissues: Unremarkable. Disc levels: There is a minimal disc desiccation without significant loss of height at T8-T9 through T10-T11. T2-T3: There is mild right facet arthropathy resulting in mild right and no significant left neural foraminal stenosis and no significant spinal canal stenosis T3-T4: There is a small right paracentral disc protrusion and mild right facet arthropathy without significant spinal canal or neural foraminal stenosis T4-T5 through T8-T9: Mild facet arthropathy without significant spinal canal or neural foraminal stenosis T9-T10: There is moderate right and mild left facet arthropathy resulting in moderate right and no significant left neural foraminal stenosis and no significant spinal canal stenosis T10-T11 through T12-L1: No significant spinal canal or neural foraminal stenosis. IMPRESSION: 1. Moderate right and mild left facet arthropathy at T9-T10 resulting in moderate right neural foraminal stenosis. There is mild edema in the right posterior elements at this level which could reflect a source of pain. 2. Mild right facet arthropathy at T2-T3 resulting in mild right neural foraminal stenosis, and small right paracentral disc protrusion at T3-T4 without significant spinal canal or neural foraminal stenosis. 3. Mild dextrocurvature centered at T8-T9. 4. Background marrow signal is somewhat hypointense on the T1 images which is nonspecific but can be seen in the setting of obesity, smoking, anemia, or less likely infiltrative marrow process. Electronically Signed   By: Lesia Hausen M.D.   On: 06/21/2022 22:00    Antibiotics:  Anti-infectives (From admission, onward)    Start     Dose/Rate Route Frequency Ordered Stop   07/07/22 1800  vancomycin (VANCOREADY) IVPB 1250 mg/250 mL        1,250 mg 166.7 mL/hr over 90 Minutes Intravenous Every 12 hours 07/07/22 1231     07/07/22 1230  ceFAZolin (ANCEF) IVPB 2g/100 mL premix  Status:  Discontinued         2 g 200 mL/hr over 30 Minutes Intravenous Every 8 hours 07/07/22 1134 07/07/22 1246   07/07/22 0615  vancomycin (VANCOREADY) IVPB 1500 mg/300 mL        1,500 mg 150 mL/hr over 120 Minutes Intravenous  Once 07/07/22 0537 07/07/22 0838       Discharge Exam: Blood pressure (!) 145/82, pulse (!) 55, temperature 98 F (36.7 C), resp. rate 18, height 5\' 9"  (1.753 m), weight 96.2 kg, SpO2 94 %. Neurologic: Grossly normal Ambulating and voiding well incision cdi   Discharge Medications:   Allergies as of 07/07/2022   No Known Allergies      Medication List     TAKE these medications    amLODipine 5 MG tablet Commonly known as: NORVASC Take 1 tablet by mouth daily.   Contrave 8-90 MG Tb12 Generic drug: Naltrexone-buPROPion HCl ER Start 1 tablet every morning for 7 days, then 1 tablet twice daily for 7 days, then 2 tablets every morning and one in the evening   cyclobenzaprine 10 MG tablet Commonly known as: FLEXERIL Take 1 tablet (10 mg total) by mouth 3 (three) times daily as needed for muscle spasms.   HYDROcodone-acetaminophen  5-325 MG tablet Commonly known as: NORCO/VICODIN Take 1 tablet by mouth every 4 (four) hours as needed for severe pain ((score 7 to 10)).   multivitamin with minerals tablet Take 1 tablet by mouth daily.   potassium chloride 10 MEQ tablet Commonly known as: Klor-Con M10 Take 1 tablet (10 mEq total) by mouth daily.   sertraline 100 MG tablet Commonly known as: ZOLOFT Take 1 tablet (100 mg total) by mouth daily.   valsartan-hydrochlorothiazide 160-25 MG tablet Commonly known as: DIOVAN-HCT Take 1 tablet by mouth daily.   Wegovy 2.4 MG/0.75ML Soaj Generic drug: Semaglutide-Weight Management Inject 2.4 mg into the skin once a week. What changed: additional instructions        Disposition: home   Final Dx: acdf C5-6, C6-7 and right CTR  Discharge Instructions     Call MD for:  difficulty breathing, headache or visual disturbances    Complete by: As directed    Call MD for:  hives   Complete by: As directed    Call MD for:  persistant dizziness or light-headedness   Complete by: As directed    Call MD for:  persistant nausea and vomiting   Complete by: As directed    Call MD for:  redness, tenderness, or signs of infection (pain, swelling, redness, odor or green/yellow discharge around incision site)   Complete by: As directed    Call MD for:  severe uncontrolled pain   Complete by: As directed    Call MD for:  temperature >100.4   Complete by: As directed    Diet - low sodium heart healthy   Complete by: As directed    Driving Restrictions   Complete by: As directed    No driving for 2 weeks, no riding in the car for 1 week   Increase activity slowly   Complete by: As directed    Lifting restrictions   Complete by: As directed    No lifting more than 8 lbs          Signed: Tiana Loft Lucy Woolever 07/07/2022, 1:59 PM

## 2022-07-07 NOTE — Progress Notes (Signed)
Pharmacy Antibiotic Note  [place] [name] is a 42 y.o. male admitted on 07/07/2022 with severe cervical spinal cord compression and spondylosis and right carpal tunnel release for surgery to receive surgical prophylaxis.  Pharmacy has been consulted for Vancomycin dosing.  Incisions closed. No drains in place.  Pre-op Vanc 1500mg  given at 0638AM.  WBC 11. Afebrile.   Plan: Vancomycin 1250 mg IV every 12 hours.  Follow-up renal function and length of therapy per Neurosurgery.   Height: 5\' 9"  (175.3 cm) Weight: 96.2 kg (212 lb) IBW/kg (Calculated) : 70.7  Temp (24hrs), Avg:98 F (36.7 C), Min:97.5 F (36.4 C), Max:98.5 F (36.9 C)  Recent Labs  Lab 07/07/22 0541  WBC 11.0*  CREATININE 0.82    Estimated Creatinine Clearance: 132.9 mL/min (by C-G formula based on SCr of 0.82 mg/dL).    No Known Allergies  Antimicrobials this admission: Vancomycin 6/26 >>  Dose adjustments this admission:   Microbiology results: 6/26 MRSA PCR: positive  Thank you for allowing pharmacy to be a part of this patient's care.  Link Snuffer, PharmD, BCPS, BCCCP Clinical Pharmacist Please refer to [facility] for [facility] numbers 07/07/2022 11:55 AM

## 2022-07-07 NOTE — H&P (Signed)
[name] West is an 42 y.o. male.   Chief Complaint: Neck and right arm pain HPI: 42 year old gentleman with severe neck and right arm pain workup revealed severe cervical spondylosis spinal cord compression at C5-6 and spondylosis at C6-7.  In addition EMG showed severe median nerve entrapment at the wrist.  So due to patient's progression of clinical syndrome imaging findings of a conservative treatment I recommended anterior cervical discectomies and fusion at C5-6 and C6-7.  In addition I recommended right carpal tunnel release.  We extensively went over the risks and benefits of the operation with him as well as perioperative course expectations of outcome and alternatives of surgery and he understood and agreed to proceed forward.  Past Medical History:  Diagnosis Date   Anxiety    Gout    History of kidney stones    passed 1   HNP (herniated nucleus pulposus), lumbar    HTN (hypertension)    Hx of low back pain    IBS (irritable bowel syndrome)    Joint pain    Leg edema    Sleep apnea     Past Surgical History:  Procedure Laterality Date   BACK SURGERY     KIDNEY STONE SURGERY     LUMBAR LAMINECTOMY/DECOMPRESSION MICRODISCECTOMY Right 02/21/2018   Procedure: Right Lumbar Three-Four Lumbar Four-Five Microdiscectomy;  Surgeon: Maeola Harman, MD;  Location: [facility] OR;  Service: Neurosurgery;  Laterality: Right;  Right Lumbar Three-Four Lumbar Four-Five Microdiscectomy    Family History  Problem Relation Age of Onset   Cancer Mother        Lung   Hyperlipidemia Father    Heart disease Father    Kidney Stones Father    Heart disease Paternal Grandfather 36       died of MI   Social History:  reports that he has never smoked. He has never used smokeless tobacco. He reports current alcohol use. He reports that he does not use drugs.  Allergies: No Known Allergies  Medications Prior to Admission  Medication Sig Dispense Refill   amLODipine (NORVASC) 5 MG tablet Take 1 tablet by  mouth daily. 90 tablet 1   Multiple Vitamins-Minerals (MULTIVITAMIN WITH MINERALS) tablet Take 1 tablet by mouth daily.     potassium chloride (KLOR-CON M10) 10 MEQ tablet Take 1 tablet (10 mEq total) by mouth daily. 90 tablet 1   Semaglutide-Weight Management (WEGOVY) 2.4 MG/0.75ML SOAJ Inject 2.4 mg into the skin once a week. (Patient taking differently: Inject 2.4 mg into the skin once a week. Takes on Sunday) 3 mL 5   sertraline (ZOLOFT) 100 MG tablet Take 1 tablet (100 mg total) by mouth daily. 90 tablet 1   valsartan-hydrochlorothiazide (DIOVAN-HCT) 160-25 MG tablet Take 1 tablet by mouth daily. 90 tablet 1   Naltrexone-buPROPion HCl ER 8-90 MG TB12 Start 1 tablet every morning for 7 days, then 1 tablet twice daily for 7 days, then 2 tablets every morning and one in the evening (Patient not taking: Reported on 07/05/2022) 120 tablet 5    Results for orders placed or performed during the hospital encounter of 07/07/22 (from the past 48 hour(s))  Basic metabolic panel per protocol     Status: None   Collection Time: 07/07/22  5:41 AM  Result Value Ref Range   Sodium 138 135 - 145 mmol/L   Potassium 3.9 3.5 - 5.1 mmol/L   Chloride 103 98 - 111 mmol/L   CO2 28 22 - 32 mmol/L   Glucose,  Bld 96 70 - 99 mg/dL    Comment: Glucose reference range applies only to samples taken after fasting for at least 8 hours.   BUN 17 6 - 20 mg/dL   Creatinine, Ser 1.61 0.61 - 1.24 mg/dL   Calcium 8.9 8.9 - 09.6 mg/dL   GFR, Estimated >04 >54 mL/min    Comment: (NOTE) Calculated using the CKD-EPI Creatinine Equation (2021)    Anion gap 7 5 - 15    Comment: Performed at [facility], [address] [place], [place] [address]  CBC per protocol     Status: Abnormal   Collection Time: 07/07/22  5:41 AM  Result Value Ref Range   WBC 11.0 (H) 4.0 - 10.5 K/uL   RBC 4.71 4.22 - 5.81 MIL/uL   Hemoglobin 13.4 13.0 - 17.0 g/dL   HCT 91.4 78.2 - 95.6 %   MCV 87.7 80.0 - 100.0 fL   MCH 28.5 26.0 - 34.0 pg    MCHC 32.4 30.0 - 36.0 g/dL   RDW 21.3 08.6 - 57.8 %   Platelets 270 150 - 400 K/uL   nRBC 0.0 0.0 - 0.2 %    Comment: Performed at [facility], [address], [place], [place] [address]   No results found.  Review of Systems  Musculoskeletal:  Positive for neck pain.  Neurological:  Positive for numbness.    Blood pressure 128/78, pulse (!) 54, temperature (!) 97.5 F (36.4 C), resp. rate 18, height 5\' 9"  (1.753 m), weight 96.2 kg, SpO2 95 %. Physical Exam HENT:     Head: Normocephalic.     Right Ear: Tympanic membrane normal.     Nose: Nose normal.     Mouth/Throat:     Mouth: Mucous membranes are moist.  Eyes:     Pupils: Pupils are equal, round, and reactive to light.  Cardiovascular:     Rate and Rhythm: Normal rate.  Pulmonary:     Effort: Pulmonary effort is normal.  Abdominal:     General: Abdomen is flat.  Musculoskeletal:        General: Normal range of motion.     Cervical back: Normal range of motion.  Neurological:     Mental Status: He is alert.     Comments: Left upper extremity is 5 out of 5 deltoid, bicep, tricep, wrist flexion, wrist extension, hand intrinsics.  Right upper extremity has weakness in his right tricep at 4 to 4+ out of 5 otherwise 5 out of 5      Assessment/Plan 42 year old presents for ACDF C5-6 C6-7 a right carpal tunnel release  Mariam Dollar, MD 07/07/2022, 7:26 AM

## 2022-07-07 NOTE — Transfer of Care (Signed)
Immediate Anesthesia Transfer of Care Note  Patient: [name] West  Procedure(s) Performed: ACDF - C5-C6 - C6-C7 (Neck) RIGHT CARPAL TUNNEL RELEASE (Right: Hand)  Patient Location: PACU  Anesthesia Type:General  Level of Consciousness: drowsy and patient cooperative  Airway & Oxygen Therapy: Patient Spontanous Breathing  Post-op Assessment: Report given to RN and Post -op Vital signs reviewed and stable  Post vital signs: Reviewed and stable  Last Vitals:  Vitals Value Taken Time  BP 164/70 07/07/22 1021  Temp    Pulse 65 07/07/22 1023  Resp 16 07/07/22 1022  SpO2 94 % 07/07/22 1023  Vitals shown include unvalidated device data.  Last Pain:  Vitals:   07/07/22 0557  PainSc: 0-No pain         Complications: No notable events documented.

## 2022-07-07 NOTE — Op Note (Signed)
Preoperative diagnosis: Cervical spondylosis with stenosis spinal cord compression and radiculopathy C5-6 C6-7 and right carpal tunnel syndrome.  Postoperative diagnosis: Same.  Procedure: #1 anterior cervical discectomies and fusion at C5-6 C6-7 utilizing allograft and globus resonate plating system.  2.  Right carpal tunnel release.  Surgeon: Donalee Citrin.  Assistant: Coletta Memos.  Anesthesia: General.  EBL: Minimal.  HPI: 42 year old gentleman with cervical spondylosis and stenosis and neck and right arm pain workup revealed severe spinal cord compression at C5-6 and severe foraminal stenosis at both C5-6 and C6-7.  In addition EMG showed right carpal tunnel syndrome.  Due to patient's progression of clinical syndrome imaging findings and EMG findings I recommended anterior cervical discectomies and fusion at C5-6 and C6-7 as well as right carpal tunnel release.  We extensively reviewed the risks and benefits of the operation with the patient as well as perioperative course expectations of outcome and alternatives to surgery and he understood and agreed to proceed forward.  Operative procedure: Patient was brought into the OR was induced under general anesthesia positioned supine the neck in slight extension 5 pounds halter traction.  The right side of his neck was prepped and draped in routine sterile fashion.  Preoperative x-ray localized the appropriate level so a curvilinear incision was made just off midline to the entry border of the sternocleidomastoid and the superficial abscess was dissected out divided longitudinally.  The avascular plane between the gynecomastia and strap muscle was developed down to the prevertebral fascia.  The fascia was dissected away with Kitners.  Interoperative x-ray confirmed identification of the appropriate level so disc bases were incised anterior osteophytes were bitten off with a 3 Miller Kerrison punch disc bases were both drilled down to the posterior  annulus and osteophytic complex.  Under microscopic lamination first working at C5-6 under biting both endplates identification and removal of the posterior large to ligament several large fragments of disc removed from the right proximal C6 foramen as well as large posterior spurring coming off the vertebral bodies was all removed.  Marching laterally both C6 nerve roots were decompressed and skeletonized flush the pedicle and the central canal was decompressed with aggressive under biting the endplates.  This was packed with Gelfoam attention taken to C6-7 and C6 similar fashion pathology here was primarily left with spur this was all removed aggressive under biting both endplates and removal of posterior logical ligament skeletonizing both C7 nerve roots decompress central canal and the foramina.  I then selected an 8 mm allograft for C6-7 a 7 mm for C5-6 the L1 M with no problems I then placed a 34 mm globus resonate plate all screws had excellent purchase locking mechanisms were engaged.  Wound was then copiously irrigated meticulous hemostasis was maintained the platysma was approximated with interrupted Vicryl skin was closed running 4 subcuticular Dermabond benzoin Steri-Strips and a sterile dressing was applied patient recovery in stable condition.  At the end the case all needle count sponge counts were correct.  Dr. Franky Macho was present and assisted in all critical parts of the fusion on the cervical part of the operation.  Patient was then repositioned for the carpal tunnel release.  Patient's right hand was prepped and draped in routine sterile fashion incision was drawn out from the distal crease of the palm down along the palmar crease and this was incised sequentially after infiltration of 5 cc lidocaine with epi the flexor retinaculum the transverse carpal ligament was then divided sequentially in layers to identify the  epineurium of the median nerve.  Then developing the plane from the open early  median nerve with the transverse carpal ligament ligament was then divided decompressing the median nerve in its entirety.  After adequate decompression achieved the wound was copiously irrigated meticulous hemostasis was maintained the skin was reapproximated with a vertical mattress and hand was dressed with Adaptic fluffs Curlex roll and Ace wrap.  Patient recovery in stable condition.

## 2022-07-08 ENCOUNTER — Other Ambulatory Visit: Payer: Self-pay

## 2022-07-08 ENCOUNTER — Encounter (HOSPITAL_COMMUNITY): Payer: Self-pay | Admitting: Neurosurgery

## 2022-08-25 ENCOUNTER — Other Ambulatory Visit (HOSPITAL_COMMUNITY): Payer: Self-pay

## 2022-08-26 DIAGNOSIS — M5412 Radiculopathy, cervical region: Secondary | ICD-10-CM | POA: Diagnosis not present

## 2022-10-07 DIAGNOSIS — M5414 Radiculopathy, thoracic region: Secondary | ICD-10-CM | POA: Diagnosis not present

## 2022-10-07 DIAGNOSIS — G540 Brachial plexus disorders: Secondary | ICD-10-CM | POA: Diagnosis not present

## 2022-10-07 DIAGNOSIS — M5412 Radiculopathy, cervical region: Secondary | ICD-10-CM | POA: Diagnosis not present

## 2022-10-11 ENCOUNTER — Other Ambulatory Visit (HOSPITAL_COMMUNITY): Payer: Self-pay | Admitting: Neurosurgery

## 2022-10-11 DIAGNOSIS — G540 Brachial plexus disorders: Secondary | ICD-10-CM

## 2022-10-15 ENCOUNTER — Ambulatory Visit (HOSPITAL_COMMUNITY)
Admission: RE | Admit: 2022-10-15 | Discharge: 2022-10-15 | Disposition: A | Discharge status: Home / Self Care | Payer: 59 | Source: Ambulatory Visit | Attending: Neurosurgery | Admitting: Neurosurgery

## 2022-10-15 DIAGNOSIS — R202 Paresthesia of skin: Secondary | ICD-10-CM | POA: Diagnosis not present

## 2022-10-15 DIAGNOSIS — G540 Brachial plexus disorders: Secondary | ICD-10-CM | POA: Insufficient documentation

## 2022-10-15 DIAGNOSIS — M129 Arthropathy, unspecified: Secondary | ICD-10-CM | POA: Diagnosis not present

## 2022-10-15 DIAGNOSIS — M7581 Other shoulder lesions, right shoulder: Secondary | ICD-10-CM | POA: Diagnosis not present

## 2022-10-26 ENCOUNTER — Other Ambulatory Visit (HOSPITAL_COMMUNITY): Payer: Self-pay

## 2022-10-26 ENCOUNTER — Other Ambulatory Visit: Payer: Self-pay

## 2022-10-26 DIAGNOSIS — G8929 Other chronic pain: Secondary | ICD-10-CM | POA: Diagnosis not present

## 2022-10-26 DIAGNOSIS — M25511 Pain in right shoulder: Secondary | ICD-10-CM | POA: Diagnosis not present

## 2022-10-26 DIAGNOSIS — Z6833 Body mass index (BMI) 33.0-33.9, adult: Secondary | ICD-10-CM | POA: Diagnosis not present

## 2022-10-26 MED ORDER — GABAPENTIN 300 MG PO CAPS
300.0000 mg | ORAL_CAPSULE | Freq: Three times a day (TID) | ORAL | 2 refills | Status: DC
  Filled 2022-10-26: qty 45, 15d supply, fill #0

## 2022-10-27 ENCOUNTER — Other Ambulatory Visit: Payer: Self-pay

## 2022-11-01 IMAGING — DX DG KNEE COMPLETE 4+V*R*
4 series · 4 of 4 positions shown · non-contrast
Comparison: None.

CLINICAL DATA: Right knee pain

EXAM:
RIGHT KNEE - COMPLETE 4+ VIEW

[knee ap]
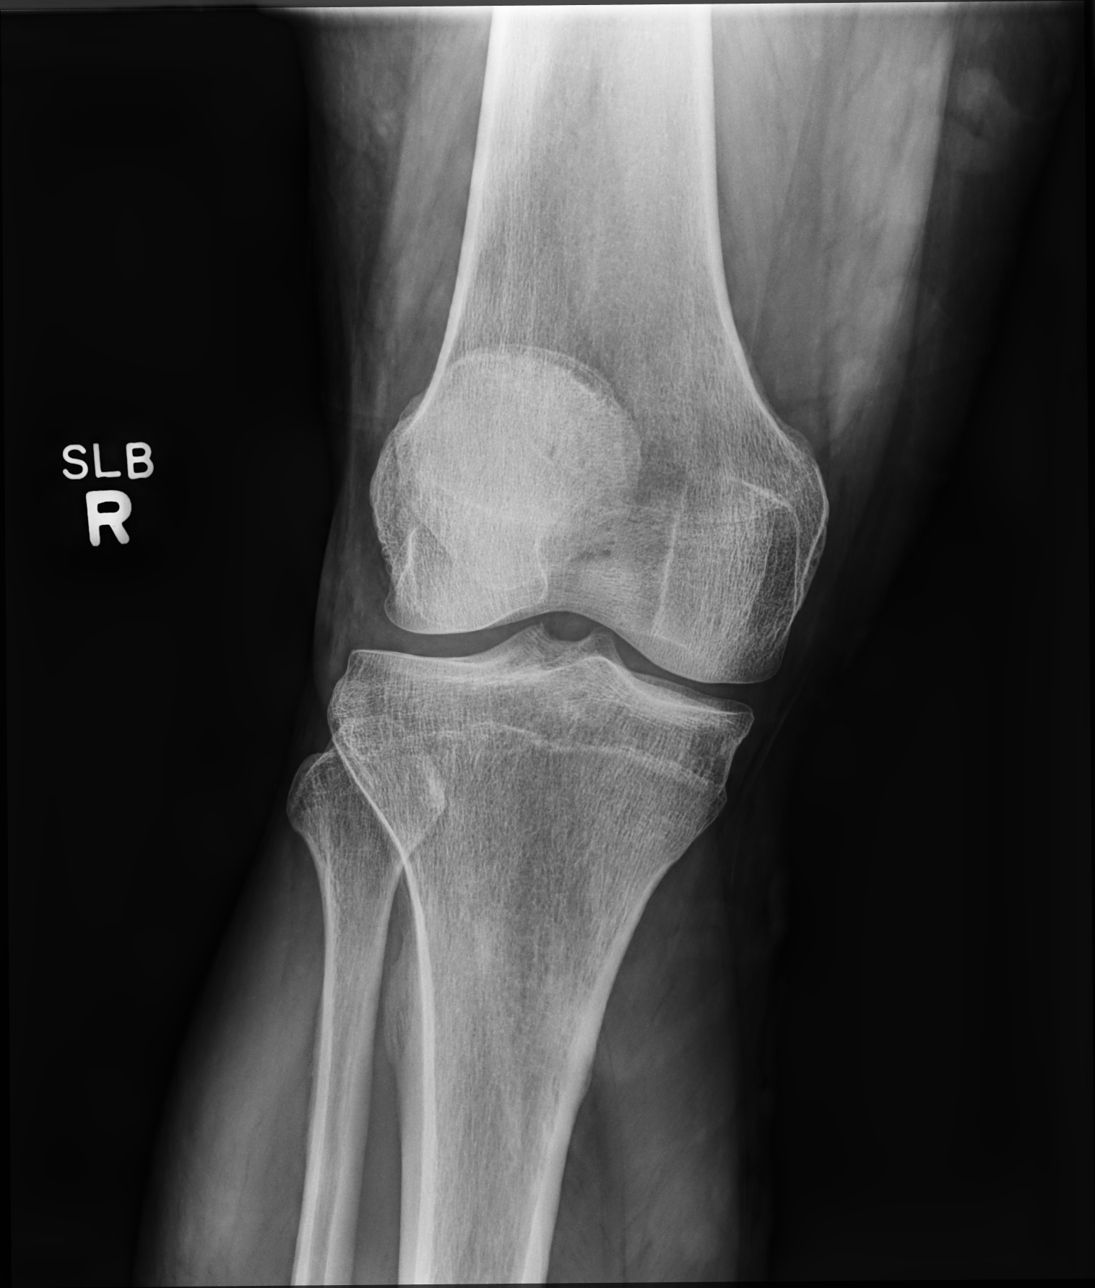

[knee mlo (1 of 2)]
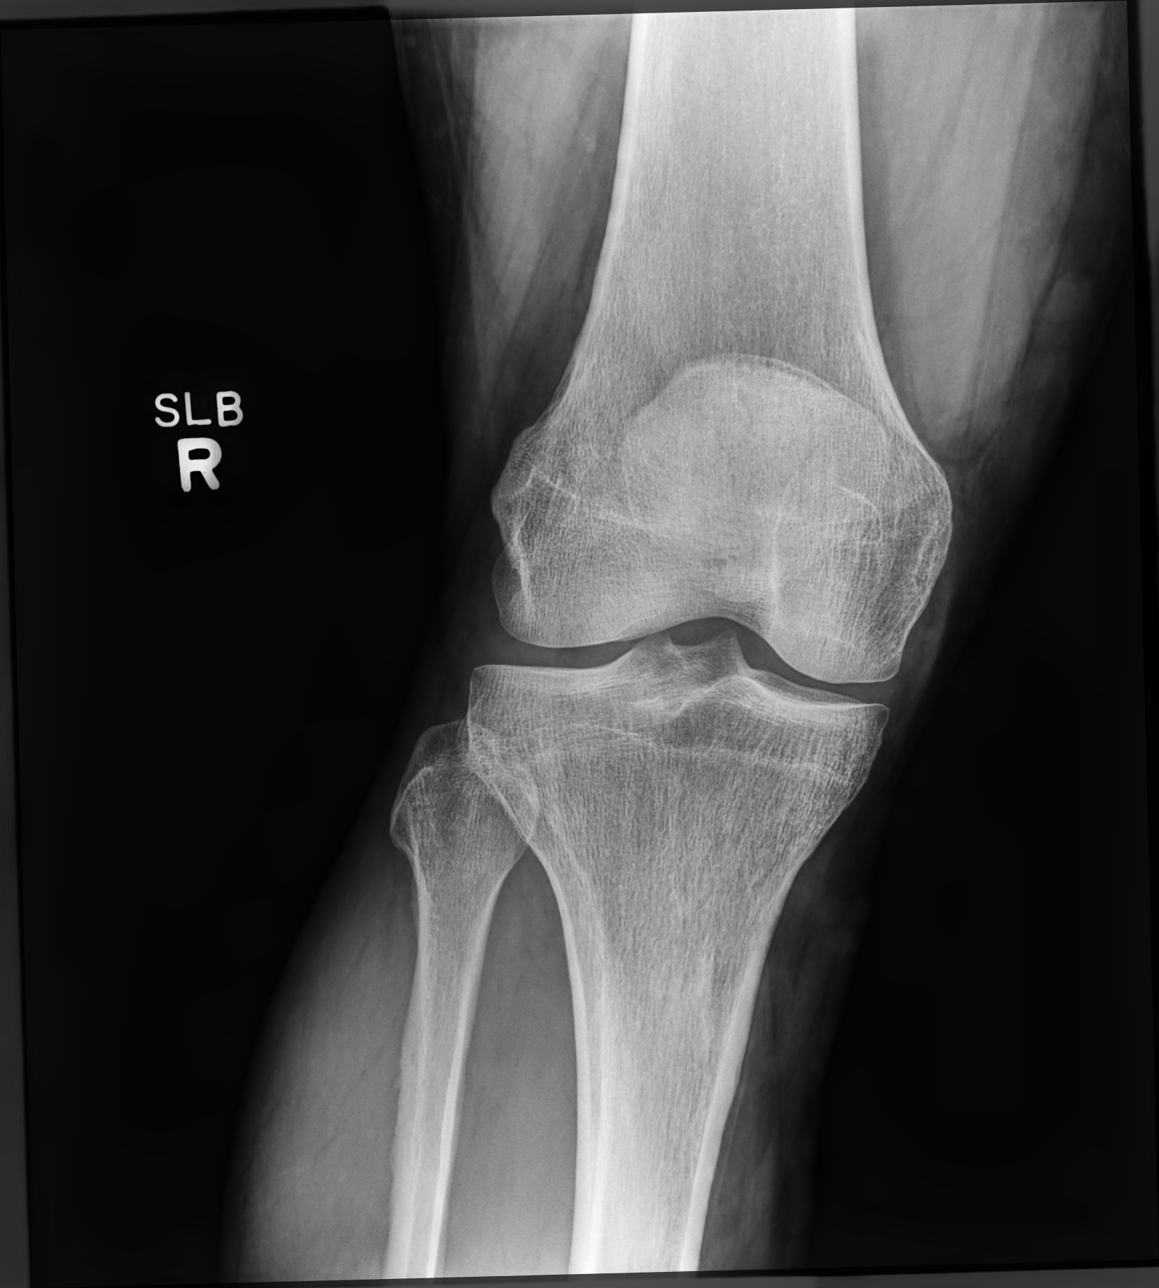

[knee mlo (2 of 2)]
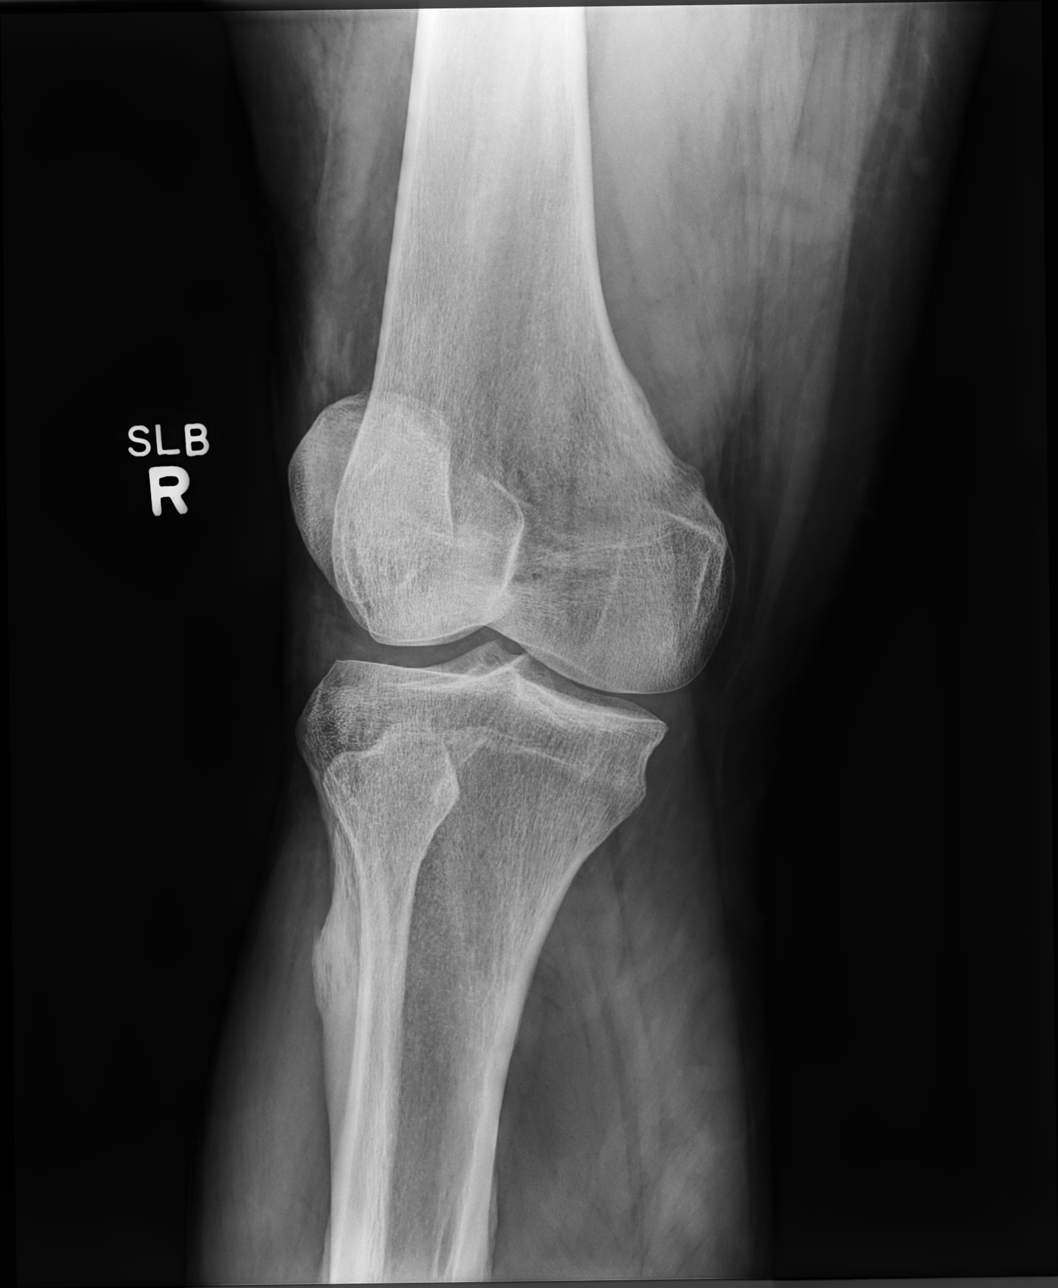

[knee lat]
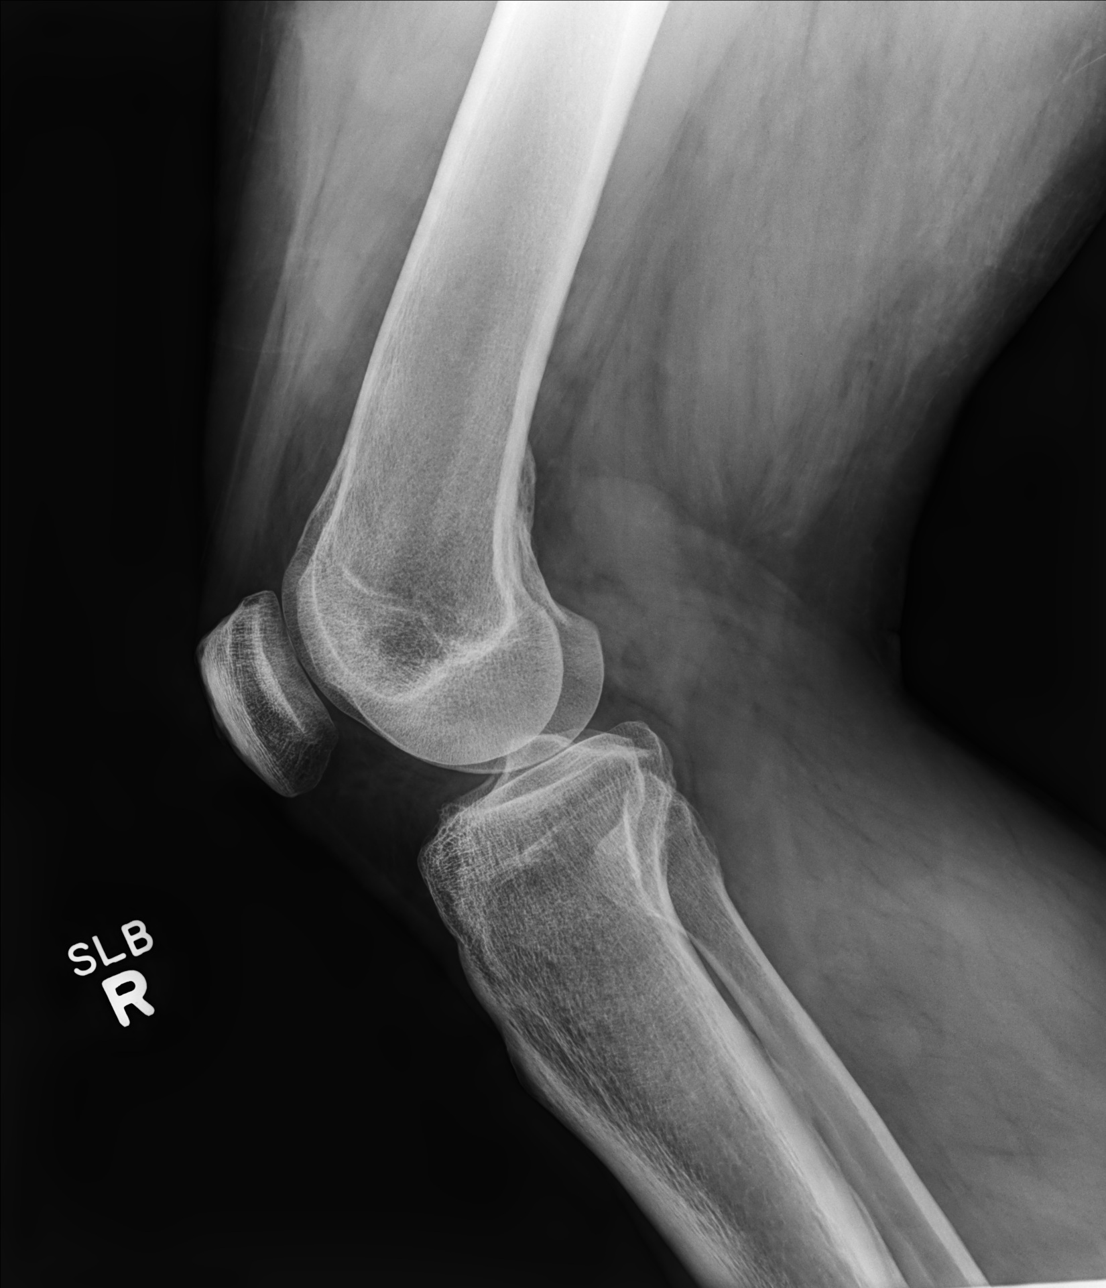

[4 of 4 positions shown; findings below may reference images not displayed]

FINDINGS: There is no evidence of acute fracture. There is mild medial
predominant degenerative change. There is no significant joint
effusion.
IMPRESSION: Mild medial predominant degenerative change of the right knee. No
evidence of acute fracture.

## 2022-11-05 ENCOUNTER — Ambulatory Visit (HOSPITAL_COMMUNITY): Payer: 59

## 2022-11-12 ENCOUNTER — Ambulatory Visit (HOSPITAL_COMMUNITY): Payer: 59 | Attending: Student

## 2022-11-12 ENCOUNTER — Other Ambulatory Visit: Payer: Self-pay

## 2022-11-12 DIAGNOSIS — M542 Cervicalgia: Secondary | ICD-10-CM | POA: Diagnosis not present

## 2022-11-12 NOTE — Therapy (Signed)
OUTPATIENT PHYSICAL THERAPY CERVICAL EVALUATION   Patient Name: [name] West MRN: [id number] DOB:Sep 30, 1980, 42 y.o., male Today's Date: 11/12/2022  END OF SESSION:  PT End of Session - 11/12/22 0809     Visit Number 1    Number of Visits 8    Date for PT Re-Evaluation 12/10/22    Authorization Type [employer]    PT Start Time 0720    PT Stop Time 0800    PT Time Calculation (min) 40 min    Activity Tolerance Patient tolerated treatment well    Behavior During Therapy WFL for tasks assessed/performed             Past Medical History:  Diagnosis Date   Anxiety    Gout    History of kidney stones    passed 1   HNP (herniated nucleus pulposus), lumbar    HTN (hypertension)    Hx of low back pain    IBS (irritable bowel syndrome)    Joint pain    Leg edema    Sleep apnea    Past Surgical History:  Procedure Laterality Date   ANTERIOR CERVICAL DECOMP/DISCECTOMY FUSION N/A 07/07/2022   Procedure: ACDF - C5-C6 - C6-C7;  Surgeon: Donalee Citrin, MD;  Location: [facility] OR;  Service: Neurosurgery;  Laterality: N/A;  3C   BACK SURGERY     CARPAL TUNNEL RELEASE Right 07/07/2022   Procedure: RIGHT CARPAL TUNNEL RELEASE;  Surgeon: Donalee Citrin, MD;  Location: [facility] OR;  Service: Neurosurgery;  Laterality: Right;   KIDNEY STONE SURGERY     LUMBAR LAMINECTOMY/DECOMPRESSION MICRODISCECTOMY Right 02/21/2018   Procedure: Right Lumbar Three-Four Lumbar Four-Five Microdiscectomy;  Surgeon: Maeola Harman, MD;  Location: [facility] OR;  Service: Neurosurgery;  Laterality: Right;  Right Lumbar Three-Four Lumbar Four-Five Microdiscectomy   Patient Active Problem List   Diagnosis Date Noted   Spondylosis of cervical joint 07/07/2022   Herniated lumbar disc without myelopathy 02/21/2018   Absolute anemia 02/07/2018   Essential hypertension 02/07/2018   Other hyperlipidemia 02/07/2018   Vitamin D deficiency 12/29/2017   Prediabetes 12/29/2017   Sleep apnea 12/22/2016   Obesity (BMI 35.0-39.9  without comorbidity) 05/07/2016   History of kidney stones 11/07/2015   Anxiety as acute reaction to exceptional stress 09/14/2014    PCP: Babs Sciara, MD  REFERRING PROVIDER: Sherryl Manges, NP  REFERRING DIAG: M54.12 (ICD-10-CM) - Radiculopathy, cervical region  THERAPY DIAG:  Cervicalgia  Rationale for Evaluation and Treatment: Rehabilitation  ONSET DATE: s/p ACDF C5/C6, C6/7 and s/p R carpal tunnel surgery on 07/07/22  SUBJECTIVE:  SUBJECTIVE STATEMENT: Arrives to the clinic with some soreness/pulling on the back of the neck and upper back when bending his neck forward and turning the neck to either sides. Still c/o numbness/tingling on the R elbow to the chest. Tingling can happen very randomly and intermittently. Patient had ACDF C5/C6, C6/7 and R carpal tunnel surgery on 07/07/22 due to impingement on the neck and carpal  tunnel syndrome respectively. Patient states that got in the pool after surgery and he did not wear a collar because the collar won't fit. Denies any problems on the R wrist/hand after the surgery. Denies any trauma/accidents/falls. Patient is already back to work for a month now and has been lifting. States that he gets sore from frequent lifting. Since the surgery, patient is just being referred to outpatient PT evaluation and management just now.  Hand dominance: Right  PERTINENT HISTORY:  Hx of 2 back surgeries (2020), R carpal  tunnel surgery (06/2022), HTN  PAIN:  Are you having pain? No  PRECAUTIONS: None  RED FLAGS: Bowel or bladder incontinence: No, Cauda equina syndrome: No, and Compression fracture: No     WEIGHT BEARING RESTRICTIONS: No  FALLS:  Has patient fallen in last 6 months? 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  DIAGNOSTIC FINDINGS:  05/17/22 MRI CERVICAL SPINE WITHOUT CONTRAST   TECHNIQUE: Multiplanar, multisequence MR imaging of the cervical spine was performed. No intravenous contrast was administered.   COMPARISON:  Radiograph from 03/11/2022.   FINDINGS: Alignment: Straightening with slight reversal of the normal cervical lordosis. No listhesis.   Vertebrae: Vertebral body height maintained without acute or chronic fracture. Diffuse loss of normal bone marrow signal, nonspecific but can be seen with anemia, smoking, obesity, and infiltrative/myelofibrotic marrow processes. No worrisome osseous lesions or abnormal marrow edema.   Cord: Normal signal and morphology.   Posterior Fossa, vertebral arteries, paraspinal tissues: Minimal cerebellar tonsillar ectopia without frank Chiari malformation. Visualized brain and posterior fossa otherwise unremarkable. Paraspinous soft tissues within normal limits. Normal flow voids seen within the vertebral arteries bilaterally.   Disc levels:   C2-C3: Unremarkable.   C3-C4:  Unremarkable.   C4-C5: Tiny right paracentral disc protrusion mildly flattens the right ventral thecal sac (series 11, image 21). No significant spinal stenosis or cord deformity. Foramina remain patent.   C5-C6: Right paracentral disc protrusion flattens and effaces the ventral thecal sac (series 10, image 26). Secondary cord flattening without cord signal changes.  Moderate spinal stenosis. Superimposed uncovertebral spurring with resultant mild right C6 foraminal narrowing. Left neural foramen remains patent.   C6-C7: Left paracentral disc protrusion indents and effaces the ventral thecal sac (series 10, image 30). Mild cord flattening without cord signal changes. Mild to moderate spinal stenosis. Superimposed uncovertebral spurring with resultant mild left C7 foraminal stenosis. Right neural foramina remains patent.   C7-T1: Negative interspace. Mild right-sided facet hypertrophy. No significant canal or foraminal stenosis.   IMPRESSION: 1. Right paracentral disc protrusion at C5-6 with resultant moderate spinal  stenosis, with mild right C6 foraminal narrowing. 2. Left paracentral disc protrusion at C6-7 with resultant mild to moderate spinal stenosis, with mild left C7 foraminal narrowing. 3. Tiny right paracentral disc protrusion at C4-5 without significant stenosis.  06/21/22 MRI THORACIC SPINE WITHOUT CONTRAST   TECHNIQUE: Multiplanar, multisequence MR imaging of the thoracic spine was performed. No intravenous contrast was administered.   COMPARISON:  No direct comparison study available.   FINDINGS: Alignment: There is mild dextrocurvature centered at T8-T9. There is no antero or retrolisthesis.   Vertebrae: Vertebral body heights are preserved. Background marrow signal is somewhat hypointense on the T1 images which is nonspecific but can be seen in the setting of obesity, smoking, anemia, or less likely infiltrative marrow process. There is no suspicious marrow signal abnormality. There is mild edema in the right posterior elements at T9-T10.   Cord:  Normal in signal and morphology.   Paraspinal and other soft tissues: Unremarkable.   Disc levels:   There is a minimal disc desiccation without significant loss of height at T8-T9 through T10-T11.   T2-T3: There is mild right facet arthropathy resulting in mild right and  no significant left neural foraminal stenosis and no significant spinal canal stenosis   T3-T4: There is a small right paracentral disc protrusion and mild right facet arthropathy without significant spinal canal or neural foraminal stenosis   T4-T5 through T8-T9: Mild facet arthropathy without significant spinal canal or neural foraminal stenosis   T9-T10: There is moderate right and mild left facet arthropathy resulting in moderate right and no significant left neural foraminal stenosis and no significant spinal canal stenosis   T10-T11 through T12-L1: No significant spinal canal or neural foraminal stenosis.   IMPRESSION: 1. Moderate right and mild left facet arthropathy at T9-T10 resulting in moderate right neural foraminal stenosis. There is mild edema in the right posterior elements at this level which could reflect a source of pain. 2. Mild right facet arthropathy at T2-T3 resulting in mild right neural foraminal stenosis, and small right paracentral disc protrusion at T3-T4 without significant spinal canal or neural foraminal stenosis. 3. Mild dextrocurvature centered at T8-T9. 4. Background marrow signal is somewhat hypointense on the T1 images which is nonspecific but can be seen in the setting of obesity, smoking, anemia, or less likely infiltrative marrow process.  10/22/22 MRI OF THE RIGHT SHOULDER WITHOUT CONTRAST   TECHNIQUE: Multiplanar, multisequence MR imaging of the shoulder was performed. No intravenous contrast was administered.   COMPARISON:  None Available.   FINDINGS: Rotator cuff: Mild tendinosis of the supraspinatus tendon. Moderate tendinosis of the infraspinatus tendon teres minor tendon is intact. Mild tendinosis of the subscapularis tendon.   Muscles: No muscle atrophy or edema. No intramuscular fluid collection or hematoma.   Biceps Long Head: Severe tendinosis of the intra-articular portion of the long head of the biceps tendon.    Acromioclavicular Joint: Moderate arthropathy of the acromioclavicular joint. No subacromial/subdeltoid bursal fluid.   Glenohumeral Joint: No joint effusion. No chondral defect.   Labrum: Grossly intact, but evaluation is limited by lack of intraarticular fluid/contrast.   Bones: No fracture or dislocation. No aggressive osseous lesion.   Other: No fluid collection or hematoma.   IMPRESSION: 1. Mild tendinosis of the supraspinatus tendon. 2. Moderate tendinosis of the infraspinatus tendon. 3. Mild tendinosis of the subscapularis tendon. 4. Severe tendinosis of the intra-articular portion of the long head of the biceps tendon.  PATIENT SURVEYS:  FOTO 71.75  COGNITION: Overall cognitive status: Within  functional limits for tasks assessed  SENSATION: Light touch: WFL on B UE  POSTURE: rounded shoulders, forward head, and increased thoracic kyphosis  PALPATION: No tenderness on B upper trapezius Moderate spasm on B upper trapezius   CERVICAL ROM:   Active ROM A/PROM (deg) eval  Flexion 25  Extension 45  Right lateral flexion 30  Left lateral flexion 25  Right rotation 50  Left rotation 40   (Blank rows = not tested) *soreness sidebending and  rotation to the L, and flexion  THORACIC ROM:   Active ROM  eval  Flexion 75%  Extension 25%  Right lateral flexion   Left lateral flexion   Right rotation 50%  Left rotation 50%   UPPER EXTREMITY ROM:  Active ROM Right eval Left eval  Shoulder flexion [facility]  Shoulder extension    Shoulder abduction [facility]  Shoulder adduction    Shoulder extension    Shoulder internal rotation [facility]  Shoulder external rotation [facility]  Elbow flexion [facility] WFL  Elbow extension [facility] WFL  Wrist flexion WFL WFL  Wrist extension [facility] WFL  Wrist ulnar deviation    Wrist radial deviation    Wrist pronation    Wrist supination     (Blank rows = not tested)  UPPER EXTREMITY MMT:  MMT Right eval Left eval  Shoulder  flexion 5 5  Shoulder extension    Shoulder abduction 5 5  Shoulder adduction    Shoulder internal rotation    Shoulder external rotation    Middle trapezius 4 4+  Lower trapezius 4+ 4+  Elbow flexion 5 5  Elbow extension 5 5  Wrist flexion 5 5  Wrist extension 5 5  Wrist ulnar deviation    Wrist radial deviation    Wrist pronation    Wrist supination    Grip strength WFL WFL   (Blank rows = not tested)  CERVICAL SPECIAL TESTS:  Upper limb tension test (ULTT): Negative radial, ulnar, and median on B   TODAY'S TREATMENT:                                                                                                                              DATE:  11/12/22 Evaluation and patient education done   PATIENT EDUCATION:  Education details: Educated on the pathoanatomy of neck fusion and cervical radiculopathy. Educated on the goals and course of rehab.  Person educated: Patient Education method: Explanation Education comprehension: verbalized understanding  HOME EXERCISE PROGRAM: None provided to date  ASSESSMENT:  CLINICAL IMPRESSION: Patient is a 42 y.o. male who was seen today for physical therapy evaluation and treatment for S/P ACDF C5-C6, C6-C7 (18-19 weeks post op at the time of eval). Patient's condition is further defined by difficulty with turning and bending the neck due to weakness, and decreased soft tissue extensibility. Skilled PT is required to address the impairments and functional limitations listed below.   OBJECTIVE IMPAIRMENTS: decreased ROM,  decreased strength, impaired flexibility, and postural dysfunction.   ACTIVITY LIMITATIONS: carrying, lifting, and bending  PARTICIPATION LIMITATIONS: driving, community activity, occupation, and yard work  PERSONAL FACTORS: 1 comorbidity: HTN  are also affecting patient's functional outcome.   REHAB POTENTIAL: Good  CLINICAL DECISION MAKING: Stable/uncomplicated  EVALUATION COMPLEXITY:  Low   GOALS: Goals reviewed with patient? Yes  SHORT TERM GOALS: Target date: 11/26/22  Pt will demonstrate indep in HEP to facilitate carry-over of skilled services and improve functional outcomes  Goal status: INITIAL  LONG TERM GOALS: Target date: 12/10/22  Pt will demonstrate increase in cervical flexion and rot ROM by 10-15 deg  to facilitate ease in ADLs  Baseline: see above Goal status: INITIAL  2.  Pt will demonstrate increase in thoracic ext and rot ROM by 25%  to facilitate ease in ADLs Baseline: see above Goal status: INITIAL  3.  Pt will demonstrate increase in UE strength to 5/5 to facilitate ease and safety in ADLs and occupation Baseline: 4+/5 Goal status: INITIAL  4.  Pt will increase FOTO to at least 75 in order to demonstrate significant improvement in function related to ADLs and occupation Baseline: 71.75 Goal status: INITIAL  PLAN:  PT FREQUENCY: 2x/week  PT DURATION: 4 weeks  PLANNED INTERVENTIONS: 97164- PT Re-evaluation, 97110-Therapeutic exercises, 97530- Therapeutic activity, 97112- Neuromuscular re-education, 97535- Self Care, 09811- Manual therapy, 97014- Electrical stimulation (unattended), Patient/Family education, Taping, Cryotherapy, and Moist heat  PLAN FOR NEXT SESSION: Provide HEP. Begin cervical strengthening and mobility activities and improve posture.   Tish Frederickson. Lakesia Dahle, PT, DPT, OCS Board-Certified Clinical Specialist in Orthopedic PT PT Compact Privilege # ([place]): [id number] T  11/12/2022, 8:12 AM

## 2022-11-15 ENCOUNTER — Ambulatory Visit (HOSPITAL_COMMUNITY): Payer: 59

## 2022-11-15 ENCOUNTER — Encounter (HOSPITAL_COMMUNITY): Payer: Self-pay

## 2022-11-15 DIAGNOSIS — M542 Cervicalgia: Secondary | ICD-10-CM

## 2022-11-15 NOTE — Therapy (Signed)
OUTPATIENT PHYSICAL THERAPY CERVICAL TREATMENT   Patient Name: [name] West MRN: [id number] DOB:1980-06-09, 42 y.o., male Today's Date: 11/15/2022  END OF SESSION:  PT End of Session - 11/15/22 1556     Visit Number 2    Number of Visits 8    Date for PT Re-Evaluation 12/10/22    Authorization Type [employer]    Progress Note Due on Visit 8    PT Start Time 1517    PT Stop Time 1555    PT Time Calculation (min) 38 min    Activity Tolerance Patient tolerated treatment well    Behavior During Therapy WFL for tasks assessed/performed              Past Medical History:  Diagnosis Date   Anxiety    Gout    History of kidney stones    passed 1   HNP (herniated nucleus pulposus), lumbar    HTN (hypertension)    Hx of low back pain    IBS (irritable bowel syndrome)    Joint pain    Leg edema    Sleep apnea    Past Surgical History:  Procedure Laterality Date   ANTERIOR CERVICAL DECOMP/DISCECTOMY FUSION N/A 07/07/2022   Procedure: ACDF - C5-C6 - C6-C7;  Surgeon: Donalee Citrin, MD;  Location: [facility] OR;  Service: Neurosurgery;  Laterality: N/A;  3C   BACK SURGERY     CARPAL TUNNEL RELEASE Right 07/07/2022   Procedure: RIGHT CARPAL TUNNEL RELEASE;  Surgeon: Donalee Citrin, MD;  Location: [facility] OR;  Service: Neurosurgery;  Laterality: Right;   KIDNEY STONE SURGERY     LUMBAR LAMINECTOMY/DECOMPRESSION MICRODISCECTOMY Right 02/21/2018   Procedure: Right Lumbar Three-Four Lumbar Four-Five Microdiscectomy;  Surgeon: Maeola Harman, MD;  Location: [facility] OR;  Service: Neurosurgery;  Laterality: Right;  Right Lumbar Three-Four Lumbar Four-Five Microdiscectomy   Patient Active Problem List   Diagnosis Date Noted   Spondylosis of cervical joint 07/07/2022   Herniated lumbar disc without myelopathy 02/21/2018   Absolute anemia 02/07/2018   Essential hypertension 02/07/2018   Other hyperlipidemia 02/07/2018   Vitamin D deficiency 12/29/2017   Prediabetes 12/29/2017   Sleep apnea  12/22/2016   Obesity (BMI 35.0-39.9 without comorbidity) 05/07/2016   History of kidney stones 11/07/2015   Anxiety as acute reaction to exceptional stress 09/14/2014    PCP: Babs Sciara, MD  REFERRING PROVIDER: Sherryl Manges, NP  REFERRING DIAG: M54.12 (ICD-10-CM) - Radiculopathy, cervical region  THERAPY DIAG:  Cervicalgia  Rationale for Evaluation and Treatment: Rehabilitation  ONSET DATE: s/p ACDF C5/C6, C6/7 and s/p R carpal tunnel surgery on 07/07/22  SUBJECTIVE:  SUBJECTIVE STATEMENT: 4/10 Pain today, just soreness more than anything else. Worked over the weekend at the race. No heavy lifting.   Hand dominance: Right  PERTINENT HISTORY:  Hx of 2 back surgeries (2020), R carpal  tunnel surgery (06/2022), HTN  PAIN:  Are you having pain? No  PRECAUTIONS: None  RED FLAGS: Bowel or bladder incontinence: No, Cauda equina syndrome: No, and Compression fracture: No     WEIGHT BEARING RESTRICTIONS: No  FALLS:  Has patient fallen in last 6 months? 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  DIAGNOSTIC FINDINGS:  05/17/22 MRI CERVICAL SPINE WITHOUT CONTRAST   TECHNIQUE: Multiplanar, multisequence MR imaging of the cervical spine was performed. No  intravenous contrast was administered.   COMPARISON:  Radiograph from 03/11/2022.   FINDINGS: Alignment: Straightening with slight reversal of the normal cervical lordosis. No listhesis.   Vertebrae: Vertebral body height maintained without acute or chronic fracture. Diffuse loss of normal bone marrow signal, nonspecific but can be seen with anemia, smoking, obesity, and infiltrative/myelofibrotic marrow processes. No worrisome osseous lesions or abnormal marrow edema.   Cord: Normal signal and morphology.   Posterior Fossa, vertebral arteries, paraspinal tissues: Minimal cerebellar tonsillar ectopia without frank Chiari malformation. Visualized brain and posterior fossa otherwise unremarkable. Paraspinous soft tissues within normal limits. Normal flow voids seen within the vertebral arteries bilaterally.   Disc levels:   C2-C3: Unremarkable.   C3-C4:  Unremarkable.   C4-C5: Tiny right paracentral disc protrusion mildly flattens the right ventral thecal sac (series 11, image 21). No significant spinal stenosis or cord deformity. Foramina remain patent.   C5-C6: Right paracentral disc protrusion flattens and effaces the ventral thecal sac (series 10, image 26). Secondary cord flattening without cord signal changes. Moderate spinal stenosis. Superimposed uncovertebral spurring with resultant mild right C6 foraminal narrowing. Left neural foramen remains patent.   C6-C7: Left paracentral disc protrusion indents and effaces the ventral thecal sac (series 10, image 30). Mild cord flattening without cord signal changes. Mild to moderate spinal stenosis. Superimposed uncovertebral spurring with resultant mild left C7 foraminal stenosis. Right neural foramina remains patent.   C7-T1: Negative interspace. Mild right-sided facet hypertrophy. No significant canal or foraminal stenosis.   IMPRESSION: 1. Right paracentral disc protrusion at C5-6 with resultant moderate spinal  stenosis, with mild right C6 foraminal narrowing. 2. Left paracentral disc protrusion at C6-7 with resultant mild to moderate spinal stenosis, with mild left C7 foraminal narrowing. 3. Tiny right paracentral disc protrusion at C4-5 without significant stenosis.  06/21/22 MRI THORACIC SPINE WITHOUT CONTRAST   TECHNIQUE: Multiplanar, multisequence MR imaging of the thoracic spine was performed. No intravenous contrast was administered.   COMPARISON:  No direct comparison study available.   FINDINGS: Alignment: There is mild dextrocurvature centered at T8-T9. There is no antero or retrolisthesis.   Vertebrae: Vertebral body heights are preserved. Background marrow signal is somewhat hypointense on the T1 images which is nonspecific but can be seen in the setting of obesity, smoking, anemia,  or less likely infiltrative marrow process. There is no suspicious marrow signal abnormality. There is mild edema in the right posterior elements at T9-T10.   Cord:  Normal in signal and morphology.   Paraspinal and other soft tissues: Unremarkable.   Disc levels:   There is a minimal disc desiccation without significant loss of height at T8-T9 through T10-T11.   T2-T3: There is mild right facet arthropathy resulting in mild right and no significant left neural foraminal stenosis and no significant spinal canal stenosis   T3-T4: There is a small right paracentral disc protrusion and mild right facet arthropathy without significant spinal canal or neural foraminal stenosis   T4-T5 through T8-T9: Mild facet arthropathy without significant spinal canal or neural foraminal stenosis   T9-T10: There is moderate right and mild left facet arthropathy resulting in moderate right and no significant left neural foraminal stenosis and no significant spinal canal stenosis   T10-T11 through T12-L1: No significant spinal canal or neural foraminal stenosis.   IMPRESSION: 1. Moderate right and  mild left facet arthropathy at T9-T10 resulting in moderate right neural foraminal stenosis. There is mild edema in the right posterior elements at this level which could reflect a source of pain. 2. Mild right facet arthropathy at T2-T3 resulting in mild right neural foraminal stenosis, and small right paracentral disc protrusion at T3-T4 without significant spinal canal or neural foraminal stenosis. 3. Mild dextrocurvature centered at T8-T9. 4. Background marrow signal is somewhat hypointense on the T1 images which is nonspecific but can be seen in the setting of obesity, smoking, anemia, or less likely infiltrative marrow process.  10/22/22 MRI OF THE RIGHT SHOULDER WITHOUT CONTRAST   TECHNIQUE: Multiplanar, multisequence MR imaging of the shoulder was performed. No intravenous contrast was administered.   COMPARISON:  None Available.   FINDINGS: Rotator cuff: Mild tendinosis of the supraspinatus tendon. Moderate tendinosis of the infraspinatus tendon teres minor tendon is intact. Mild tendinosis of the subscapularis tendon.   Muscles: No muscle atrophy or edema. No intramuscular fluid collection or hematoma.   Biceps Long Head: Severe tendinosis of the intra-articular portion of the long head of the biceps tendon.   Acromioclavicular Joint: Moderate arthropathy of the acromioclavicular joint. No subacromial/subdeltoid bursal fluid.   Glenohumeral Joint: No joint effusion. No chondral defect.   Labrum: Grossly intact, but evaluation is limited by lack of intraarticular fluid/contrast.   Bones: No fracture or dislocation. No aggressive osseous lesion.   Other: No fluid collection or hematoma.   IMPRESSION: 1. Mild tendinosis of the supraspinatus tendon. 2. Moderate tendinosis of the infraspinatus tendon. 3. Mild tendinosis of the subscapularis tendon. 4. Severe tendinosis of the intra-articular portion of the long head of the biceps tendon.  PATIENT SURVEYS:   FOTO 71.75  COGNITION: Overall cognitive status: Within functional limits for tasks assessed  SENSATION: Light touch: WFL on B UE  POSTURE: rounded shoulders, forward head, and increased thoracic kyphosis  PALPATION: No tenderness on B upper trapezius Moderate spasm on B upper trapezius   CERVICAL ROM:   Active ROM A/PROM (deg) eval  Flexion 25  Extension 45  Right lateral flexion 30  Left lateral flexion 25  Right rotation 50  Left rotation 40   (Blank rows = not tested) *soreness sidebending and  rotation to the L, and flexion  THORACIC ROM:   Active ROM  eval  Flexion 75%  Extension 25%  Right lateral flexion   Left lateral flexion   Right rotation 50%  Left rotation  50%   UPPER EXTREMITY ROM:  Active ROM Right eval Left eval  Shoulder flexion [facility]  Shoulder extension    Shoulder abduction [facility]  Shoulder adduction    Shoulder extension    Shoulder internal rotation [facility]  Shoulder external rotation [facility]  Elbow flexion [facility] WFL  Elbow extension [facility] WFL  Wrist flexion [facility] WFL  Wrist extension [facility] WFL  Wrist ulnar deviation    Wrist radial deviation    Wrist pronation    Wrist supination     (Blank rows = not tested)  UPPER EXTREMITY MMT:  MMT Right eval Left eval  Shoulder flexion 5 5  Shoulder extension    Shoulder abduction 5 5  Shoulder adduction    Shoulder internal rotation    Shoulder external rotation    Middle trapezius 4 4+  Lower trapezius 4+ 4+  Elbow flexion 5 5  Elbow extension 5 5  Wrist flexion 5 5  Wrist extension 5 5  Wrist ulnar deviation    Wrist radial deviation    Wrist pronation    Wrist supination    Grip strength WFL WFL   (Blank rows = not tested)  CERVICAL SPECIAL TESTS:  Upper limb tension test (ULTT): Negative radial, ulnar, and median on B   TODAY'S TREATMENT:                                                                                                                              DATE:   11/15/2022  -Seated towel assisted C-spine rotation ROM 5x10' bilaterally -Seated towel assisted C-spine extenion ROM 5 x 10' -Seated upper trapezius stretch 5 x 10' bilaterally  -Standing wall angels x 10  -Seated Cervical Extension with RTB x 12 -Seated shoulder horizontal abduction 1 set to fatigue with RTB.  -Bilateral doorway stretch 5 x 10'  -standing y pressup with YTB x 15  11/12/22 Evaluation and patient education done   PATIENT EDUCATION:  Education details: see HEP below.  Person educated: Patient Education method: Explanation Education comprehension: verbalized understanding  HOME EXERCISE PROGRAM: Access Code: [phone] URL: [email or web address] Date: 11/15/2022 Prepared by: Starling Manns  Exercises - Seated Assisted Cervical Rotation with Towel  - 1 x daily - 7 x weekly - 3 sets - 10 reps - Cervical Extension AROM with Strap  - 1 x daily - 7 x weekly - 3 sets - 10 reps - Seated Upper Trapezius Stretch  - 1 x daily - 7 x weekly - 3 sets - 10 reps - Low Trap Setting at Wall  - 1 x daily - 7 x weekly - 3 sets - 10 reps - Wall Angels  - 1 x daily - 7 x weekly - 3 sets - 10 reps - Standing Shoulder Horizontal Abduction with Resistance  - 1 x daily - 7 x weekly - 3 sets - 10 reps - Doorway Pec Stretch at 90 Degrees Abduction  - 1  x daily - 7 x weekly - 3 sets - 10 reps  ASSESSMENT:  CLINICAL IMPRESSION: Pt tolerating first treatment session well. Focused on building HEP, gaining ROM and scapular strengthening to improve thoracic mobility. Pt showing moderate fatigue with thoracic strengthening activities today. Pt reportedly improved pain response with mobility this session. To date,  Skilled PT is required to address the impairments and functional limitations listed below.  OBJECTIVE IMPAIRMENTS: decreased ROM, decreased strength, impaired flexibility, and postural dysfunction.   ACTIVITY LIMITATIONS: carrying, lifting, and bending  PARTICIPATION  LIMITATIONS: driving, community activity, occupation, and yard work  PERSONAL FACTORS: 1 comorbidity: HTN  are also affecting patient's functional outcome.   REHAB POTENTIAL: Good  CLINICAL DECISION MAKING: Stable/uncomplicated  EVALUATION COMPLEXITY: Low   GOALS: Goals reviewed with patient? Yes  SHORT TERM GOALS: Target date: 11/26/22  Pt will demonstrate indep in HEP to facilitate carry-over of skilled services and improve functional outcomes  Goal status: INITIAL  LONG TERM GOALS: Target date: 12/10/22  Pt will demonstrate increase in cervical flexion and rot ROM by 10-15 deg  to facilitate ease in ADLs  Baseline: see above Goal status: INITIAL  2.  Pt will demonstrate increase in thoracic ext and rot ROM by 25%  to facilitate ease in ADLs Baseline: see above Goal status: INITIAL  3.  Pt will demonstrate increase in UE strength to 5/5 to facilitate ease and safety in ADLs and occupation Baseline: 4+/5 Goal status: INITIAL  4.  Pt will increase FOTO to at least 75 in order to demonstrate significant improvement in function related to ADLs and occupation Baseline: 71.75 Goal status: INITIAL  PLAN:  PT FREQUENCY: 2x/week  PT DURATION: 4 weeks  PLANNED INTERVENTIONS: 97164- PT Re-evaluation, 97110-Therapeutic exercises, 97530- Therapeutic activity, 97112- Neuromuscular re-education, 97535- Self Care, 16109- Manual therapy, 97014- Electrical stimulation (unattended), Patient/Family education, Taping, Cryotherapy, and Moist heat  PLAN FOR NEXT SESSION: Provide HEP. Begin cervical strengthening and mobility activities and improve posture.  Nelida Meuse PT, DPT Physical Therapist with [facility] [phone] office  11/15/2022, 3:58 PM

## 2022-11-16 DIAGNOSIS — G5601 Carpal tunnel syndrome, right upper limb: Secondary | ICD-10-CM | POA: Diagnosis not present

## 2022-11-19 ENCOUNTER — Encounter (HOSPITAL_COMMUNITY): Payer: 59

## 2022-11-22 ENCOUNTER — Encounter (HOSPITAL_COMMUNITY): Payer: Self-pay

## 2022-11-22 ENCOUNTER — Ambulatory Visit (HOSPITAL_COMMUNITY): Payer: 59

## 2022-11-22 DIAGNOSIS — M542 Cervicalgia: Secondary | ICD-10-CM | POA: Diagnosis not present

## 2022-11-22 NOTE — Therapy (Signed)
OUTPATIENT PHYSICAL THERAPY CERVICAL TREATMENT   Patient Name: [name] West MRN: [id number] DOB:December 26, 1980, 42 y.o., male Today's Date: 11/22/2022  END OF SESSION:  PT End of Session - 11/22/22 1600     Visit Number 3    Number of Visits 8    Date for PT Re-Evaluation 12/10/22    Authorization Type [employer]    Progress Note Due on Visit 8    PT Start Time 1515    PT Stop Time 1559    PT Time Calculation (min) 44 min    Activity Tolerance Patient tolerated treatment well    Behavior During Therapy WFL for tasks assessed/performed               Past Medical History:  Diagnosis Date   Anxiety    Gout    History of kidney stones    passed 1   HNP (herniated nucleus pulposus), lumbar    HTN (hypertension)    Hx of low back pain    IBS (irritable bowel syndrome)    Joint pain    Leg edema    Sleep apnea    Past Surgical History:  Procedure Laterality Date   ANTERIOR CERVICAL DECOMP/DISCECTOMY FUSION N/A 07/07/2022   Procedure: ACDF - C5-C6 - C6-C7;  Surgeon: Donalee Citrin, MD;  Location: [facility] OR;  Service: Neurosurgery;  Laterality: N/A;  3C   BACK SURGERY     CARPAL TUNNEL RELEASE Right 07/07/2022   Procedure: RIGHT CARPAL TUNNEL RELEASE;  Surgeon: Donalee Citrin, MD;  Location: [facility] OR;  Service: Neurosurgery;  Laterality: Right;   KIDNEY STONE SURGERY     LUMBAR LAMINECTOMY/DECOMPRESSION MICRODISCECTOMY Right 02/21/2018   Procedure: Right Lumbar Three-Four Lumbar Four-Five Microdiscectomy;  Surgeon: Maeola Harman, MD;  Location[facility] OR;  Service: Neurosurgery;  Laterality: Right;  Right Lumbar Three-Four Lumbar Four-Five Microdiscectomy   Patient Active Problem List   Diagnosis Date Noted   Spondylosis of cervical joint 07/07/2022   Herniated lumbar disc without myelopathy 02/21/2018   Absolute anemia 02/07/2018   Essential hypertension 02/07/2018   Other hyperlipidemia 02/07/2018   Vitamin D deficiency 12/29/2017   Prediabetes 12/29/2017   Sleep apnea  12/22/2016   Obesity (BMI 35.0-39.9 without comorbidity) 05/07/2016   History of kidney stones 11/07/2015   Anxiety as acute reaction to exceptional stress 09/14/2014    PCP: Babs Sciara, MD  REFERRING PROVIDER: Sherryl Manges, NP  REFERRING DIAG: M54.12 (ICD-10-CM) - Radiculopathy, cervical region  THERAPY DIAG:  Cervicalgia  Rationale for Evaluation and Treatment: Rehabilitation  ONSET DATE: s/p ACDF C5/C6, C6/7 and s/p R carpal tunnel surgery on 07/07/22  SUBJECTIVE:  SUBJECTIVE STATEMENT: Pain in neck is about 2/10 in neck and with more soreness pointed over on L shoulder. .  Hand dominance: Right  PERTINENT HISTORY:  Hx of 2 back surgeries (2020), R carpal  tunnel surgery (06/2022), HTN  PAIN:  Are you having pain? No  PRECAUTIONS: None  RED FLAGS: Bowel or bladder incontinence: No, Cauda equina syndrome: No, and Compression fracture: No     WEIGHT BEARING RESTRICTIONS: No  FALLS:  Has patient fallen in last 6 months? 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  DIAGNOSTIC FINDINGS:  05/17/22 MRI CERVICAL SPINE WITHOUT CONTRAST   TECHNIQUE: Multiplanar, multisequence MR imaging of the cervical spine was performed. No intravenous contrast was  administered.   COMPARISON:  Radiograph from 03/11/2022.   FINDINGS: Alignment: Straightening with slight reversal of the normal cervical lordosis. No listhesis.   Vertebrae: Vertebral body height maintained without acute or chronic fracture. Diffuse loss of normal bone marrow signal, nonspecific but can be seen with anemia, smoking, obesity, and infiltrative/myelofibrotic marrow processes. No worrisome osseous lesions or abnormal marrow edema.   Cord: Normal signal and morphology.   Posterior Fossa, vertebral arteries, paraspinal tissues: Minimal cerebellar tonsillar ectopia without frank Chiari malformation. Visualized brain and posterior fossa otherwise unremarkable. Paraspinous soft tissues within normal limits. Normal flow voids seen within the vertebral arteries bilaterally.   Disc levels:   C2-C3: Unremarkable.   C3-C4:  Unremarkable.   C4-C5: Tiny right paracentral disc protrusion mildly flattens the right ventral thecal sac (series 11, image 21). No significant spinal stenosis or cord deformity. Foramina remain patent.   C5-C6: Right paracentral disc protrusion flattens and effaces the ventral thecal sac (series 10, image 26). Secondary cord flattening without cord signal changes. Moderate spinal stenosis. Superimposed uncovertebral spurring with resultant mild right C6 foraminal narrowing. Left neural foramen remains patent.   C6-C7: Left paracentral disc protrusion indents and effaces the ventral thecal sac (series 10, image 30). Mild cord flattening without cord signal changes. Mild to moderate spinal stenosis. Superimposed uncovertebral spurring with resultant mild left C7 foraminal stenosis. Right neural foramina remains patent.   C7-T1: Negative interspace. Mild right-sided facet hypertrophy. No significant canal or foraminal stenosis.   IMPRESSION: 1. Right paracentral disc protrusion at C5-6 with resultant moderate spinal stenosis, with mild right C6  foraminal narrowing. 2. Left paracentral disc protrusion at C6-7 with resultant mild to moderate spinal stenosis, with mild left C7 foraminal narrowing. 3. Tiny right paracentral disc protrusion at C4-5 without significant stenosis.  06/21/22 MRI THORACIC SPINE WITHOUT CONTRAST   TECHNIQUE: Multiplanar, multisequence MR imaging of the thoracic spine was performed. No intravenous contrast was administered.   COMPARISON:  No direct comparison study available.   FINDINGS: Alignment: There is mild dextrocurvature centered at T8-T9. There is no antero or retrolisthesis.   Vertebrae: Vertebral body heights are preserved. Background marrow signal is somewhat hypointense on the T1 images which is nonspecific but can be seen in the setting of obesity, smoking, anemia, or less  likely infiltrative marrow process. There is no suspicious marrow signal abnormality. There is mild edema in the right posterior elements at T9-T10.   Cord:  Normal in signal and morphology.   Paraspinal and other soft tissues: Unremarkable.   Disc levels:   There is a minimal disc desiccation without significant loss of height at T8-T9 through T10-T11.   T2-T3: There is mild right facet arthropathy resulting in mild right and no significant left neural foraminal stenosis and no significant spinal canal stenosis   T3-T4: There is a small right paracentral disc protrusion and mild right facet arthropathy without significant spinal canal or neural foraminal stenosis   T4-T5 through T8-T9: Mild facet arthropathy without significant spinal canal or neural foraminal stenosis   T9-T10: There is moderate right and mild left facet arthropathy resulting in moderate right and no significant left neural foraminal stenosis and no significant spinal canal stenosis   T10-T11 through T12-L1: No significant spinal canal or neural foraminal stenosis.   IMPRESSION: 1. Moderate right and mild left facet arthropathy at  T9-T10 resulting in moderate right neural foraminal stenosis. There is mild edema in the right posterior elements at this level which could reflect a source of pain. 2. Mild right facet arthropathy at T2-T3 resulting in mild right neural foraminal stenosis, and small right paracentral disc protrusion at T3-T4 without significant spinal canal or neural foraminal stenosis. 3. Mild dextrocurvature centered at T8-T9. 4. Background marrow signal is somewhat hypointense on the T1 images which is nonspecific but can be seen in the setting of obesity, smoking, anemia, or less likely infiltrative marrow process.  10/22/22 MRI OF THE RIGHT SHOULDER WITHOUT CONTRAST   TECHNIQUE: Multiplanar, multisequence MR imaging of the shoulder was performed. No intravenous contrast was administered.   COMPARISON:  None Available.   FINDINGS: Rotator cuff: Mild tendinosis of the supraspinatus tendon. Moderate tendinosis of the infraspinatus tendon teres minor tendon is intact. Mild tendinosis of the subscapularis tendon.   Muscles: No muscle atrophy or edema. No intramuscular fluid collection or hematoma.   Biceps Long Head: Severe tendinosis of the intra-articular portion of the long head of the biceps tendon.   Acromioclavicular Joint: Moderate arthropathy of the acromioclavicular joint. No subacromial/subdeltoid bursal fluid.   Glenohumeral Joint: No joint effusion. No chondral defect.   Labrum: Grossly intact, but evaluation is limited by lack of intraarticular fluid/contrast.   Bones: No fracture or dislocation. No aggressive osseous lesion.   Other: No fluid collection or hematoma.   IMPRESSION: 1. Mild tendinosis of the supraspinatus tendon. 2. Moderate tendinosis of the infraspinatus tendon. 3. Mild tendinosis of the subscapularis tendon. 4. Severe tendinosis of the intra-articular portion of the long head of the biceps tendon.  PATIENT SURVEYS:  FOTO 71.75  COGNITION: Overall  cognitive status: Within functional limits for tasks assessed  SENSATION: Light touch: WFL on B UE  POSTURE: rounded shoulders, forward head, and increased thoracic kyphosis  PALPATION: No tenderness on B upper trapezius Moderate spasm on B upper trapezius   CERVICAL ROM:   Active ROM A/PROM (deg) eval  Flexion 25  Extension 45  Right lateral flexion 30  Left lateral flexion 25  Right rotation 50  Left rotation 40   (Blank rows = not tested) *soreness sidebending and  rotation to the L, and flexion  THORACIC ROM:   Active ROM  eval  Flexion 75%  Extension 25%  Right lateral flexion   Left lateral flexion   Right rotation 50%  Left rotation 50%  UPPER EXTREMITY ROM:  Active ROM Right eval Left eval  Shoulder flexion [facility]  Shoulder extension    Shoulder abduction [facility]  Shoulder adduction    Shoulder extension    Shoulder internal rotation [facility]  Shoulder external rotation [facility]  Elbow flexion [facility] WFL  Elbow extension [facility] WFL  Wrist flexion [facility] WFL  Wrist extension [facility] WFL  Wrist ulnar deviation    Wrist radial deviation    Wrist pronation    Wrist supination     (Blank rows = not tested)  UPPER EXTREMITY MMT:  MMT Right eval Left eval  Shoulder flexion 5 5  Shoulder extension    Shoulder abduction 5 5  Shoulder adduction    Shoulder internal rotation    Shoulder external rotation    Middle trapezius 4 4+  Lower trapezius 4+ 4+  Elbow flexion 5 5  Elbow extension 5 5  Wrist flexion 5 5  Wrist extension 5 5  Wrist ulnar deviation    Wrist radial deviation    Wrist pronation    Wrist supination    Grip strength WFL WFL   (Blank rows = not tested)  CERVICAL SPECIAL TESTS:  Upper limb tension test (ULTT): Negative radial, ulnar, and median on B   TODAY'S TREATMENT:                                                                                                                              DATE:  11/22/2022  -door-way stretch 3  x 30' -Standing wall angels 2 x 10  -Standing wall ER with GTB 2 sets to fatigue x bilateraly -Deep Neck Flexor Endurance Test: 44 seconds -Scapular retraction bodycraft #2 to fatigue -standing y pressup with YTB 2 x 15 -Supine pressups (scapular protractioon 30x w/2lb dumbbells.   11/15/2022  -Seated towel assisted C-spine rotation ROM 5x10' bilaterally -Seated towel assisted C-spine extenion ROM 5 x 10' -Seated upper trapezius stretch 5 x 10' bilaterally  -Standing wall angels x 10  -Seated Cervical Extension with RTB x 12 -Seated shoulder horizontal abduction 1 set to fatigue with RTB.  -Bilateral doorway stretch 5 x 10'  -standing y pressup with YTB x 15  11/12/22 Evaluation and patient education done   PATIENT EDUCATION:  Education details: see HEP below.  Person educated: Patient Education method: Explanation Education comprehension: verbalized understanding  HOME EXERCISE PROGRAM: Access Code: [email or web address] URL: [email or web address] Date: 11/22/2022 Prepared by: Starling Manns  Exercises - Seated Assisted Cervical Rotation with Towel  - 1 x daily - 7 x weekly - 3 sets - 10 reps - Cervical Extension AROM with Strap  - 1 x daily - 7 x weekly - 3 sets - 10 reps - Seated Upper Trapezius Stretch  - 1 x daily - 7 x weekly - 3 sets - 10 reps - Low Trap Setting at Wall  - 1 x daily - 7 x weekly - 3 sets - 10  reps - Wall Angels  - 1 x daily - 7 x weekly - 3 sets - 10 reps - Standing Shoulder Horizontal Abduction with Resistance  - 1 x daily - 7 x weekly - 3 sets - 10 reps - Doorway Pec Stretch at 90 Degrees Abduction  - 1 x daily - 7 x weekly - 3 sets - 10 reps - Shoulder External Rotation with Anchored Resistance  - 1 x daily - 7 x weekly - 3 sets - 10 reps - Supine Deep Neck Flexor Training - Repetitions  - 1 x daily - 7 x weekly - 3 sets - 10 reps - Supine Scapular Protraction in Flexion with Dumbbells  - 1 x daily - 7 x weekly - 3 sets - 10  reps  ASSESSMENT:  CLINICAL IMPRESSION: Pt tolerating session well. Throughout session observed L elevated scapula compared to R, upon testing Left RC weaker compared to Right RC. Given specific strengthening for periscapular strengthening for improving posture. Updated HEP. Increased scapular winging greater on L side vs R side as well. Continues with asymmetries during posture which increase fatigue during ADLs. To date,  Skilled PT is required to address the impairments and functional limitations listed below.  OBJECTIVE IMPAIRMENTS: decreased ROM, decreased strength, impaired flexibility, and postural dysfunction.   ACTIVITY LIMITATIONS: carrying, lifting, and bending  PARTICIPATION LIMITATIONS: driving, community activity, occupation, and yard work  PERSONAL FACTORS: 1 comorbidity: HTN  are also affecting patient's functional outcome.   REHAB POTENTIAL: Good  CLINICAL DECISION MAKING: Stable/uncomplicated  EVALUATION COMPLEXITY: Low   GOALS: Goals reviewed with patient? Yes  SHORT TERM GOALS: Target date: 11/26/22  Pt will demonstrate indep in HEP to facilitate carry-over of skilled services and improve functional outcomes  Goal status: INITIAL  LONG TERM GOALS: Target date: 12/10/22  Pt will demonstrate increase in cervical flexion and rot ROM by 10-15 deg  to facilitate ease in ADLs  Baseline: see above Goal status: INITIAL  2.  Pt will demonstrate increase in thoracic ext and rot ROM by 25%  to facilitate ease in ADLs Baseline: see above Goal status: INITIAL  3.  Pt will demonstrate increase in UE strength to 5/5 to facilitate ease and safety in ADLs and occupation Baseline: 4+/5 Goal status: INITIAL  4.  Pt will increase FOTO to at least 75 in order to demonstrate significant improvement in function related to ADLs and occupation Baseline: 71.75 Goal status: INITIAL  PLAN:  PT FREQUENCY: 2x/week  PT DURATION: 4 weeks  PLANNED INTERVENTIONS: 97164- PT  Re-evaluation, 97110-Therapeutic exercises, 97530- Therapeutic activity, 97112- Neuromuscular re-education, 97535- Self Care, 86578- Manual therapy, 97014- Electrical stimulation (unattended), Patient/Family education, Taping, Cryotherapy, and Moist heat  PLAN FOR NEXT SESSION: Provide HEP. Begin cervical strengthening and mobility activities and improve posture.  Nelida Meuse PT, DPT Physical Therapist with [facility] [phone] office  11/22/2022, 4:01 PM

## 2022-11-25 ENCOUNTER — Other Ambulatory Visit (HOSPITAL_COMMUNITY): Payer: Self-pay

## 2022-11-25 ENCOUNTER — Ambulatory Visit: Payer: 59 | Admitting: Family Medicine

## 2022-11-26 ENCOUNTER — Encounter (HOSPITAL_COMMUNITY): Payer: 59

## 2022-11-26 ENCOUNTER — Ambulatory Visit (INDEPENDENT_AMBULATORY_CARE_PROVIDER_SITE_OTHER): Payer: 59 | Admitting: Family Medicine

## 2022-11-26 ENCOUNTER — Encounter: Payer: Self-pay | Admitting: Family Medicine

## 2022-11-26 VITALS — BP 130/73 | HR 62 | Temp 98.1°F | Ht 69.0 in | Wt 221.0 lb

## 2022-11-26 DIAGNOSIS — I10 Essential (primary) hypertension: Secondary | ICD-10-CM | POA: Diagnosis not present

## 2022-11-26 DIAGNOSIS — F439 Reaction to severe stress, unspecified: Secondary | ICD-10-CM

## 2022-11-26 DIAGNOSIS — M792 Neuralgia and neuritis, unspecified: Secondary | ICD-10-CM

## 2022-11-29 ENCOUNTER — Encounter (HOSPITAL_COMMUNITY): Payer: 59

## 2022-11-30 NOTE — Progress Notes (Signed)
Subjective:    Patient ID: [name] West, male    DOB: Apr 27, 1980, 42 y.o.   MRN: [id number]  Discussed the use of AI scribe software for clinical note transcription with the patient, who gave verbal consent to proceed.  History of Present Illness   The patient, diagnosed with obesity, has been managing his weight with Wegovy injections. However, he reports that his supply of Reginal Lutes is dwindling, with only three shots remaining. He has been trying to stretch out the medication by taking it every two to three weeks instead of weekly. He expresses concern about the high cost of Wegovy and its competitor, Zepbound, which has led him to consider other options. He has been trying to maintain his weight through physical activity and dietary control.  The patient also reports a tingling sensation from his neck to his shoulder. He recently underwent a nerve conduction study, which ruled out any issues in the neck. However, the tingling persists, leading to the suspicion that the issue may be in the shoulder. An MRI of the shoulder did not reveal any tears, only strains. The patient has been started on Gabapentin for this issue, but he reports that the medication makes him excessively drowsy, limiting his ability to take it during the day. He has been taking it only at night, which helps with sleep but the tingling sensation persists during the day.  The patient also has hypertension, which is well-controlled with his current medication regimen. He expresses hope that if his weight decreases further, he might be able to reduce his blood pressure medication. However, he also expresses concern that his weight might increase once his supply of Wegovy runs out.  Lastly, the patient is on Sertraline (Zoloft) for an unspecified mental health condition. He reports that he has been feeling more irritable and easily annoyed recently, which he discussed with his spouse. He is considering increasing the dose of  Sertraline, but has decided to monitor his symptoms for now.         Review of Systems     Objective:    Physical Exam   VITALS: BP- 124/62    General-in no acute distress Eyes-no discharge Lungs-respiratory rate normal, CTA CV-no murmurs,RRR Extremities skin warm dry no edema Neuro grossly normal Behavior normal, alert        Assessment & Plan:  Assessment and Plan    Weight Management Patient has been on Wegovy but is running out and has been stretching out the doses. Discussed the cost and availability of similar medications like Zepbound. Patient is trying to maintain weight through diet and physical activity. -Consider Zepbound as an alternative to [facility] if cost becomes more manageable in the future. -Encouraged patient to maintain physical activity and healthy diet.  Neuropathic Pain Patient experiencing tingling from neck to shoulder. Recently started on Gabapentin 300mg  but experiencing excessive sedation. Discussed the need for gradual dose titration. -Start Gabapentin 100mg  and gradually increase dose every 4-5 days as tolerated. -Patient to communicate progress and side effects.  Hypertension Blood pressure well controlled on current medication. Discussed potential for dose reduction if weight loss continues. -Continue current antihypertensive medication. -Monitor blood pressure and adjust medication as needed.  Mental Health Patient on Sertraline (Zoloft) and has mentioned possible need for dose increase due to some challenging days. Decided to monitor for now. -Continue Sertraline at current dose. -Patient to communicate if challenging days become more frequent, indicating a need for dose adjustment.  General Health Maintenance -Order labs to  be done within the next 30 days. -Follow-up appointment in 6 months.      1. Essential hypertension Good control continue current measures  2. Stress Zoloft doing well for him  3. Neuropathic pain Continue  gabapentin denies drowsiness  Moderate obesity was morbidly obese Reginal Lutes doing a good job for him continue current measures  Follow-up within 6 months

## 2022-12-01 DIAGNOSIS — M25511 Pain in right shoulder: Secondary | ICD-10-CM | POA: Diagnosis not present

## 2022-12-03 ENCOUNTER — Encounter (HOSPITAL_COMMUNITY): Payer: 59

## 2022-12-12 ENCOUNTER — Encounter: Payer: Self-pay | Admitting: Family Medicine

## 2022-12-13 ENCOUNTER — Other Ambulatory Visit: Payer: Self-pay

## 2022-12-13 ENCOUNTER — Other Ambulatory Visit: Payer: Self-pay | Admitting: Family Medicine

## 2022-12-13 MED ORDER — GABAPENTIN 100 MG PO CAPS
100.0000 mg | ORAL_CAPSULE | Freq: Three times a day (TID) | ORAL | 3 refills | Status: DC
  Filled 2022-12-13: qty 90, 30d supply, fill #0
  Filled 2023-06-14: qty 90, 30d supply, fill #1

## 2022-12-29 ENCOUNTER — Encounter: Payer: Self-pay | Admitting: Emergency Medicine

## 2022-12-29 ENCOUNTER — Ambulatory Visit
Admission: EM | Admit: 2022-12-29 | Discharge: 2022-12-29 | Disposition: A | Discharge status: Home / Self Care | Payer: 59 | Attending: Family Medicine | Admitting: Family Medicine

## 2022-12-29 DIAGNOSIS — J101 Influenza due to other identified influenza virus with other respiratory manifestations: Secondary | ICD-10-CM | POA: Diagnosis not present

## 2022-12-29 LAB — POCT INFLUENZA A/B
Influenza A, POC: POSITIVE — AB
Influenza B, POC: NEGATIVE

## 2022-12-29 LAB — POCT RAPID STREP A (OFFICE): Rapid Strep A Screen: NEGATIVE

## 2022-12-29 MED ORDER — OSELTAMIVIR PHOSPHATE 75 MG PO CAPS: 75.0000 mg | ORAL_CAPSULE | Freq: Two times a day (BID) | ORAL | 0 refills | Status: DC

## 2022-12-29 MED ORDER — PROMETHAZINE-DM 6.25-15 MG/5ML PO SYRP: 5.0000 mL | ORAL_SOLUTION | Freq: Four times a day (QID) | ORAL | 0 refills | Status: DC | PRN

## 2022-12-29 NOTE — ED Triage Notes (Signed)
Scratchy throat on Sunday.  Coughing started on Monday with fever and chills.  Has been taking dayquil and nyquil.

## 2023-01-02 NOTE — ED Provider Notes (Signed)
[facility]    CSN: [id number] Arrival date & time: 12/29/22  1510      History   Chief Complaint No chief complaint on file.   HPI [name] West is a 42 y.o. male.   Presenting today with sore throat, cough, fever, chills, fatigue x 3-4 days. Denies CP, SOB, abdominal pain, N/V/D. So far trying dayquil and nyquil with mild temporary benefit. Multiple sick contacts recently.     Past Medical History:  Diagnosis Date   Anxiety    Gout    History of kidney stones    passed 1   HNP (herniated nucleus pulposus), lumbar    HTN (hypertension)    Hx of low back pain    IBS (irritable bowel syndrome)    Joint pain    Leg edema    Sleep apnea     Patient Active Problem List   Diagnosis Date Noted   Spondylosis of cervical joint 07/07/2022   Herniated lumbar disc without myelopathy 02/21/2018   Absolute anemia 02/07/2018   Essential hypertension 02/07/2018   Other hyperlipidemia 02/07/2018   Vitamin D deficiency 12/29/2017   Prediabetes 12/29/2017   Sleep apnea 12/22/2016   Obesity (BMI 35.0-39.9 without comorbidity) 05/07/2016   History of kidney stones 11/07/2015   Anxiety as acute reaction to exceptional stress 09/14/2014    Past Surgical History:  Procedure Laterality Date   ANTERIOR CERVICAL DECOMP/DISCECTOMY FUSION N/A 07/07/2022   Procedure: ACDF - C5-C6 - C6-C7;  Surgeon: Donalee Citrin, MD;  Location: [facility] OR;  Service: Neurosurgery;  Laterality: N/A;  3C   BACK SURGERY     CARPAL TUNNEL RELEASE Right 07/07/2022   Procedure: RIGHT CARPAL TUNNEL RELEASE;  Surgeon: Donalee Citrin, MD;  Location: [facility] OR;  Service: Neurosurgery;  Laterality: Right;   KIDNEY STONE SURGERY     LUMBAR LAMINECTOMY/DECOMPRESSION MICRODISCECTOMY Right 02/21/2018   Procedure: Right Lumbar Three-Four Lumbar Four-Five Microdiscectomy;  Surgeon: Maeola Harman, MD;  Location: [facility] OR;  Service: Neurosurgery;  Laterality: Right;  Right Lumbar Three-Four Lumbar Four-Five Microdiscectomy        Home Medications    Prior to Admission medications   Medication Sig Start Date End Date Taking? Authorizing Provider  oseltamivir (TAMIFLU) 75 MG capsule Take 1 capsule (75 mg total) by mouth every 12 (twelve) hours. 12/29/22  Yes Particia Nearing, PA-C  promethazine-dextromethorphan (PROMETHAZINE-DM) 6.25-15 MG/5ML syrup Take 5 mLs by mouth 4 (four) times daily as needed. 12/29/22  Yes Particia Nearing, PA-C  amLODipine (NORVASC) 5 MG tablet Take 1 tablet by mouth daily. 05/26/22   Babs Sciara, MD  gabapentin (NEURONTIN) 100 MG capsule Take 1 capsule (100 mg total) by mouth 3 (three) times daily. 12/13/22   Babs Sciara, MD  Multiple Vitamins-Minerals (MULTIVITAMIN WITH MINERALS) tablet Take 1 tablet by mouth daily.    [provider]  potassium chloride (KLOR-CON M10) 10 MEQ tablet Take 1 tablet (10 mEq total) by mouth daily. 05/26/22   Babs Sciara, MD  Semaglutide-Weight Management (WEGOVY) 2.4 MG/0.75ML SOAJ Inject 2.4 mg into the skin once a week. Patient taking differently: Inject 2.4 mg into the skin once a week. Takes on Sunday 02/01/22   Babs Sciara, MD  sertraline (ZOLOFT) 100 MG tablet Take 1 tablet (100 mg total) by mouth daily. 05/26/22   Babs Sciara, MD  valsartan-hydrochlorothiazide (DIOVAN-HCT) 160-25 MG tablet Take 1 tablet by mouth daily. 05/26/22   Babs Sciara, MD    Family History Family  History  Problem Relation Age of Onset   Cancer Mother        Lung   Hyperlipidemia Father    Heart disease Father    Kidney Stones Father    Heart disease Paternal Grandfather 51       died of MI    Social History Social History   Tobacco Use   Smoking status: Never   Smokeless tobacco: Never  Vaping Use   Vaping status: Never Used  Substance Use Topics   Alcohol use: Yes    Comment: occas social   Drug use: No     Allergies   Patient has no known allergies.   Review of Systems Review of Systems PER HPI  Physical  Exam Triage Vital Signs ED Triage Vitals [12/29/22 1526]  Encounter Vitals Group     BP (!) 134/90     Systolic BP Percentile      Diastolic BP Percentile      Pulse Rate 78     Resp 18     Temp 99.6 F (37.6 C)     Temp Source Oral     SpO2 97 %     Weight      Height      Head Circumference      Peak Flow      Pain Score      Pain Loc      Pain Education      Exclude from Growth Chart    No data found.  Updated Vital Signs BP (!) 134/90 (BP Location: Right Arm)   Pulse 78   Temp 99.6 F (37.6 C) (Oral)   Resp 18   SpO2 97%   Visual Acuity Right Eye Distance:   Left Eye Distance:   Bilateral Distance:    Right Eye Near:   Left Eye Near:    Bilateral Near:     Physical Exam Vitals and nursing note reviewed.  Constitutional:      Appearance: He is well-developed.  HENT:     Head: Atraumatic.     Right Ear: External ear normal.     Left Ear: External ear normal.     Nose: Rhinorrhea present.     Mouth/Throat:     Pharynx: Posterior oropharyngeal erythema present. No oropharyngeal exudate.  Eyes:     Conjunctiva/sclera: Conjunctivae normal.     Pupils: Pupils are equal, round, and reactive to light.  Cardiovascular:     Rate and Rhythm: Normal rate and regular rhythm.  Pulmonary:     Effort: Pulmonary effort is normal. No respiratory distress.     Breath sounds: No wheezing or rales.  Musculoskeletal:        General: Normal range of motion.     Cervical back: Normal range of motion and neck supple.  Lymphadenopathy:     Cervical: No cervical adenopathy.  Skin:    General: Skin is warm and dry.  Neurological:     Mental Status: He is alert and oriented to person, place, and time.  Psychiatric:        Behavior: Behavior normal.      UC Treatments / Results  Labs (all labs ordered are listed, but only abnormal results are displayed) Labs Reviewed  POCT INFLUENZA A/B - Abnormal; Notable for the following components:      Result Value    Influenza A, POC Positive (*)    All other components within normal limits  POCT RAPID STREP A (OFFICE)  EKG   Radiology No results found.  Procedures Procedures (including critical care time)  Medications Ordered in UC Medications - No data to display  Initial Impression / Assessment and Plan / UC Course  I have reviewed the triage vital signs and the nursing notes.  Pertinent labs & imaging results that were available during my care of the patient were reviewed by me and considered in my medical decision making (see chart for details).     Rapid flu positive for influenza a, treat with tamiflu, phenergan dm, supportive OTC medications and home care. Return for worsening sxs. Work note given  Final Clinical Impressions(s) / UC Diagnoses   Final diagnoses:  Influenza A   Discharge Instructions   None    ED Prescriptions     Medication Sig Dispense Auth. Provider   oseltamivir (TAMIFLU) 75 MG capsule Take 1 capsule (75 mg total) by mouth every 12 (twelve) hours. 10 capsule Particia Nearing, [place]   promethazine-dextromethorphan (PROMETHAZINE-DM) 6.25-15 MG/5ML syrup Take 5 mLs by mouth 4 (four) times daily as needed. 100 mL Particia Nearing, [place]      PDMP not reviewed this encounter.   Particia Nearing, [place] 01/02/23 2147

## 2023-01-17 DIAGNOSIS — G5621 Lesion of ulnar nerve, right upper limb: Secondary | ICD-10-CM | POA: Diagnosis not present

## 2023-01-17 DIAGNOSIS — M25511 Pain in right shoulder: Secondary | ICD-10-CM | POA: Diagnosis not present

## 2023-01-19 DIAGNOSIS — G5621 Lesion of ulnar nerve, right upper limb: Secondary | ICD-10-CM | POA: Diagnosis not present

## 2023-02-04 DIAGNOSIS — M25521 Pain in right elbow: Secondary | ICD-10-CM | POA: Diagnosis not present

## 2023-02-04 DIAGNOSIS — G5621 Lesion of ulnar nerve, right upper limb: Secondary | ICD-10-CM | POA: Diagnosis not present

## 2023-02-17 DIAGNOSIS — G5621 Lesion of ulnar nerve, right upper limb: Secondary | ICD-10-CM | POA: Diagnosis not present

## 2023-03-21 DIAGNOSIS — D225 Melanocytic nevi of trunk: Secondary | ICD-10-CM | POA: Diagnosis not present

## 2023-03-21 DIAGNOSIS — D485 Neoplasm of uncertain behavior of skin: Secondary | ICD-10-CM | POA: Diagnosis not present

## 2023-03-29 DIAGNOSIS — H524 Presbyopia: Secondary | ICD-10-CM | POA: Diagnosis not present

## 2023-04-15 ENCOUNTER — Encounter: Payer: Self-pay | Admitting: Family Medicine

## 2023-04-15 NOTE — Telephone Encounter (Signed)
 PER DPR Diane: Patient would like to restart [facility] and she will use the [facility] cash pay savings card- would like it sent to [facility]

## 2023-04-17 NOTE — Telephone Encounter (Signed)
 Nurses It is true that Zepbound has indication for treating of sleep apnea.  But having the indication for treatment does not necessarily mean insurance companies will cover it They can call [place] and talk with them but it is my understanding that this medication is not covered by [employer] for this indication  Zepbound and Wegovy both have direct to consumer availability but once again my understanding is that insurance companies do not cover this for weight loss  As for [facility] if they would like to start this Wegovy 0.25 mg once a week for 4 weeks Then 0.5 mg once a week for 4 weeks and then patient can connect with [place] if it is doing well for them we can keep upping the dose  I am uncertain if [place] desires the same approach if so it would be fine to send in [facility] for him once it is verified.  Once again Zepbound does have FDA indication for sleep apnea but current insurance policy does not cover this medicine for that indication  If anything different needs to be done please let me know

## 2023-04-26 ENCOUNTER — Other Ambulatory Visit (HOSPITAL_COMMUNITY): Payer: Self-pay

## 2023-05-27 ENCOUNTER — Ambulatory Visit: Payer: 59 | Admitting: Family Medicine

## 2023-06-14 ENCOUNTER — Other Ambulatory Visit: Payer: Self-pay | Admitting: Family Medicine

## 2023-06-14 ENCOUNTER — Other Ambulatory Visit: Payer: Self-pay

## 2023-06-14 ENCOUNTER — Other Ambulatory Visit (HOSPITAL_COMMUNITY): Payer: Self-pay

## 2023-06-14 MED ORDER — POTASSIUM CHLORIDE CRYS ER 10 MEQ PO TBCR
10.0000 meq | EXTENDED_RELEASE_TABLET | Freq: Every day | ORAL | 1 refills | Status: DC
  Filled 2023-06-14: qty 90, 90d supply, fill #0

## 2023-06-14 MED ORDER — VALSARTAN-HYDROCHLOROTHIAZIDE 160-25 MG PO TABS
1.0000 | ORAL_TABLET | Freq: Every day | ORAL | 1 refills | Status: DC
  Filled 2023-06-14: qty 90, 90d supply, fill #0

## 2023-06-14 MED ORDER — AMLODIPINE BESYLATE 5 MG PO TABS
5.0000 mg | ORAL_TABLET | Freq: Every day | ORAL | 1 refills | Status: DC
  Filled 2023-06-14: qty 90, 90d supply, fill #0

## 2023-06-14 MED ORDER — SERTRALINE HCL 100 MG PO TABS
100.0000 mg | ORAL_TABLET | Freq: Every day | ORAL | 0 refills | Status: DC
  Filled 2023-06-14: qty 90, 90d supply, fill #0

## 2023-07-25 ENCOUNTER — Other Ambulatory Visit (HOSPITAL_COMMUNITY): Payer: Self-pay

## 2023-07-25 ENCOUNTER — Ambulatory Visit: Payer: Self-pay | Admitting: Family Medicine

## 2023-07-25 VITALS — BP 134/68 | HR 61 | Temp 98.6°F | Ht 69.0 in | Wt 239.2 lb

## 2023-07-25 DIAGNOSIS — I10 Essential (primary) hypertension: Secondary | ICD-10-CM | POA: Diagnosis not present

## 2023-07-25 DIAGNOSIS — Z79899 Other long term (current) drug therapy: Secondary | ICD-10-CM | POA: Diagnosis not present

## 2023-07-25 DIAGNOSIS — E7849 Other hyperlipidemia: Secondary | ICD-10-CM

## 2023-07-25 DIAGNOSIS — R739 Hyperglycemia, unspecified: Secondary | ICD-10-CM | POA: Diagnosis not present

## 2023-07-25 MED ORDER — AMLODIPINE BESYLATE 5 MG PO TABS
5.0000 mg | ORAL_TABLET | Freq: Every day | ORAL | 1 refills | Status: AC
  Filled 2023-07-25 – 2023-11-25 (×3): qty 90, 90d supply, fill #0

## 2023-07-25 MED ORDER — SERTRALINE HCL 100 MG PO TABS
100.0000 mg | ORAL_TABLET | Freq: Every day | ORAL | 0 refills | Status: AC
  Filled 2023-07-25 – 2023-11-25 (×3): qty 90, 90d supply, fill #0

## 2023-07-25 MED ORDER — VALSARTAN-HYDROCHLOROTHIAZIDE 160-25 MG PO TABS
1.0000 | ORAL_TABLET | Freq: Every day | ORAL | 1 refills | Status: AC
  Filled 2023-07-25 – 2023-11-25 (×4): qty 90, 90d supply, fill #0

## 2023-07-25 MED ORDER — POTASSIUM CHLORIDE CRYS ER 10 MEQ PO TBCR
10.0000 meq | EXTENDED_RELEASE_TABLET | Freq: Every day | ORAL | 1 refills | Status: AC
  Filled 2023-07-25 – 2023-11-25 (×3): qty 90, 90d supply, fill #0

## 2023-07-25 NOTE — Progress Notes (Signed)
   Subjective:    Patient ID: [name] West, male    DOB: October 12, 1980, 43 y.o.   MRN: [id number]  HPI Pt comes in today for 6 month follow up, no questions or complaints. Medications and allergies reviewed.  Discussed the use of AI scribe software for clinical note transcription with the patient, who gave verbal consent to proceed.  Discussed the use of AI scribe software for clinical note transcription with the patient, who gave verbal consent to proceed.  History of Present Illness   The patient is a 42 year old who presents for follow-up regarding weight management and medication use.  The patient has experienced weight regain after previously losing 96 pounds, which had significantly improved his physical comfort, knee pain, and sleep apnea. He has difficulty managing his weight due to a lack of satiety and describes a persistent 'brain noise' related to eating habits. GLP-1 medications were helpful in managing weight and reducing these symptoms, but the high cost is a barrier to continued use.  He is currently taking valsartan  HCTZ, sertraline , and amlodipine  regularly, and has stopped taking gabapentin . He continues to take potassium supplements. He feels good day-to-day but acknowledges not getting as many steps as he probably should, despite doing a lot of walking and standing at work.  There is a family history of polyps in his father, who had some removed during a colonoscopy last year. There is no family history of colon or prostate cancer.      Review of Systems     Objective:   Physical Exam General-in no acute distress Eyes-no discharge Lungs-respiratory rate normal, CTA CV-no murmurs,RRR Extremities skin warm dry no edema Neuro grossly normal Behavior normal, alert        Assessment & Plan:  Obesity Weight regain with difficulty in satiety and eating habits. Previous GLP-1 medications beneficial but cost prohibitive. Zepbound more effective than Wegovy  but  expensive. - Discuss potential future use of Zepbound if affordable or covered by insurance. - Encourage continued mindfulness of eating habits.  Hypertension Managed with valsartan , HCTZ, and amlodipine . Blood pressure 136/64 mmHg, generally well-controlled. - Continue valsartan , HCTZ, and amlodipine  as prescribed. - Encourage regular physical activity, including walking. Lab work ordered [employer] Maintenance Routine colon cancer screening recommended at 45. Family history of polyps. Due for routine lab work. - Send referral to Dr. Samuel office in December for colonoscopy in January. - Order lab work within 30 days to assess cholesterol, kidney function, sodium, potassium, and glucose. - Perform urine test to screen for proteinuria.  Stress Overall managing well Sertraline  does a good job for him Takes it on a regular basis  Hyperlipidemia-check lipid profile.  Keep LDL in a safe range  Hyperglycemia check A1c  Follow-up 6 months

## 2023-11-25 ENCOUNTER — Other Ambulatory Visit (HOSPITAL_BASED_OUTPATIENT_CLINIC_OR_DEPARTMENT_OTHER): Payer: Self-pay

## 2023-11-25 ENCOUNTER — Other Ambulatory Visit (HOSPITAL_COMMUNITY): Payer: Self-pay

## 2023-11-28 ENCOUNTER — Other Ambulatory Visit (HOSPITAL_BASED_OUTPATIENT_CLINIC_OR_DEPARTMENT_OTHER): Payer: Self-pay

## 2024-01-23 ENCOUNTER — Encounter (HOSPITAL_COMMUNITY): Payer: Self-pay

## 2024-01-23 ENCOUNTER — Emergency Department (HOSPITAL_COMMUNITY)

## 2024-01-23 ENCOUNTER — Inpatient Hospital Stay (HOSPITAL_COMMUNITY)
Admission: EM | Admit: 2024-01-23 | Discharge: 2024-01-25 | DRG: 395 | Disposition: A | Discharge status: Home / Self Care | Attending: Internal Medicine | Admitting: Internal Medicine

## 2024-01-23 ENCOUNTER — Other Ambulatory Visit: Payer: Self-pay

## 2024-01-23 DIAGNOSIS — Z8349 Family history of other endocrine, nutritional and metabolic diseases: Secondary | ICD-10-CM | POA: Diagnosis not present

## 2024-01-23 DIAGNOSIS — K5900 Constipation, unspecified: Secondary | ICD-10-CM | POA: Diagnosis present

## 2024-01-23 DIAGNOSIS — Z6836 Body mass index (BMI) 36.0-36.9, adult: Secondary | ICD-10-CM | POA: Diagnosis not present

## 2024-01-23 DIAGNOSIS — Z79899 Other long term (current) drug therapy: Secondary | ICD-10-CM

## 2024-01-23 DIAGNOSIS — Z8249 Family history of ischemic heart disease and other diseases of the circulatory system: Secondary | ICD-10-CM | POA: Diagnosis not present

## 2024-01-23 DIAGNOSIS — R338 Other retention of urine: Secondary | ICD-10-CM | POA: Diagnosis present

## 2024-01-23 DIAGNOSIS — K589 Irritable bowel syndrome without diarrhea: Secondary | ICD-10-CM | POA: Diagnosis present

## 2024-01-23 DIAGNOSIS — D72829 Elevated white blood cell count, unspecified: Secondary | ICD-10-CM | POA: Diagnosis present

## 2024-01-23 DIAGNOSIS — K611 Rectal abscess: Secondary | ICD-10-CM | POA: Diagnosis present

## 2024-01-23 DIAGNOSIS — R339 Retention of urine, unspecified: Secondary | ICD-10-CM | POA: Diagnosis present

## 2024-01-23 DIAGNOSIS — K612 Anorectal abscess: Secondary | ICD-10-CM | POA: Diagnosis present

## 2024-01-23 DIAGNOSIS — Z87442 Personal history of urinary calculi: Secondary | ICD-10-CM | POA: Diagnosis not present

## 2024-01-23 DIAGNOSIS — Z801 Family history of malignant neoplasm of trachea, bronchus and lung: Secondary | ICD-10-CM | POA: Diagnosis not present

## 2024-01-23 DIAGNOSIS — F419 Anxiety disorder, unspecified: Secondary | ICD-10-CM | POA: Diagnosis present

## 2024-01-23 DIAGNOSIS — G4733 Obstructive sleep apnea (adult) (pediatric): Secondary | ICD-10-CM | POA: Diagnosis present

## 2024-01-23 DIAGNOSIS — E66812 Obesity, class 2: Secondary | ICD-10-CM | POA: Diagnosis present

## 2024-01-23 DIAGNOSIS — I10 Essential (primary) hypertension: Secondary | ICD-10-CM | POA: Diagnosis present

## 2024-01-23 LAB — URINALYSIS, ROUTINE W REFLEX MICROSCOPIC
Bilirubin Urine: NEGATIVE
Glucose, UA: NEGATIVE mg/dL
Hgb urine dipstick: NEGATIVE
Ketones, ur: NEGATIVE mg/dL
Leukocytes,Ua: NEGATIVE
Nitrite: NEGATIVE
Protein, ur: 30 mg/dL — AB
Specific Gravity, Urine: 1.02 (ref 1.005–1.030)
pH: 6 (ref 5.0–8.0)

## 2024-01-23 LAB — CBC
HCT: 41.4 % (ref 39.0–52.0)
Hemoglobin: 13.3 g/dL (ref 13.0–17.0)
MCH: 28.1 pg (ref 26.0–34.0)
MCHC: 32.1 g/dL (ref 30.0–36.0)
MCV: 87.3 fL (ref 80.0–100.0)
Platelets: 295 K/uL (ref 150–400)
RBC: 4.74 MIL/uL (ref 4.22–5.81)
RDW: 13.6 % (ref 11.5–15.5)
WBC: 23.2 K/uL — ABNORMAL HIGH (ref 4.0–10.5)
nRBC: 0 % (ref 0.0–0.2)

## 2024-01-23 LAB — BASIC METABOLIC PANEL WITH GFR
Anion gap: 14 (ref 5–15)
BUN: 11 mg/dL (ref 6–20)
CO2: 24 mmol/L (ref 22–32)
Calcium: 9.2 mg/dL (ref 8.9–10.3)
Chloride: 103 mmol/L (ref 98–111)
Creatinine, Ser: 0.82 mg/dL (ref 0.61–1.24)
GFR, Estimated: >60 mL/min
Glucose, Bld: 101 mg/dL — ABNORMAL HIGH (ref 70–99)
Potassium: 4.2 mmol/L (ref 3.5–5.1)
Sodium: 141 mmol/L (ref 135–145)

## 2024-01-23 MED ORDER — SERTRALINE HCL 50 MG PO TABS
100.0000 mg | ORAL_TABLET | Freq: Every day | ORAL | Status: DC
  Administered 2024-01-23 – 2024-01-25 (×3): 100 mg via ORAL
  Filled 2024-01-23 (×3): qty 2

## 2024-01-23 MED ORDER — ONDANSETRON HCL 4 MG/2ML IJ SOLN: 4.0000 mg | Freq: Once | INTRAMUSCULAR | Status: DC

## 2024-01-23 MED ORDER — PIPERACILLIN-TAZOBACTAM 3.375 G IVPB
3.3750 g | Freq: Three times a day (TID) | INTRAVENOUS | Status: DC
  Administered 2024-01-24 – 2024-01-25 (×5): 3.375 g via INTRAVENOUS
  Filled 2024-01-23 (×5): qty 50

## 2024-01-23 MED ORDER — IOHEXOL 300 MG/ML  SOLN
100.0000 mL | Freq: Once | INTRAMUSCULAR | Status: AC | PRN
  Administered 2024-01-23: 100 mL via INTRAVENOUS

## 2024-01-23 MED ORDER — AMLODIPINE BESYLATE 5 MG PO TABS
5.0000 mg | ORAL_TABLET | Freq: Every day | ORAL | Status: DC
  Administered 2024-01-23 – 2024-01-25 (×3): 5 mg via ORAL
  Filled 2024-01-23 (×3): qty 1

## 2024-01-23 MED ORDER — ONDANSETRON HCL 4 MG/2ML IJ SOLN: 4.0000 mg | Freq: Four times a day (QID) | INTRAMUSCULAR | Status: DC | PRN

## 2024-01-23 MED ORDER — OXYCODONE-ACETAMINOPHEN 5-325 MG PO TABS
1.0000 | ORAL_TABLET | Freq: Once | ORAL | Status: AC
  Administered 2024-01-23: 1 via ORAL
  Filled 2024-01-23: qty 1

## 2024-01-23 MED ORDER — VANCOMYCIN HCL 1750 MG/350ML IV SOLN: 1750.0000 mg | Freq: Once | INTRAVENOUS | Status: DC

## 2024-01-23 MED ORDER — ENOXAPARIN SODIUM 60 MG/0.6ML IJ SOSY: 0.5000 mg/kg | PREFILLED_SYRINGE | INTRAMUSCULAR | Status: DC

## 2024-01-23 MED ORDER — LACTATED RINGERS IV BOLUS: 1000.0000 mL | Freq: Once | INTRAVENOUS | Status: DC

## 2024-01-23 MED ORDER — OXYCODONE-ACETAMINOPHEN 5-325 MG PO TABS
1.0000 | ORAL_TABLET | ORAL | Status: DC | PRN
  Administered 2024-01-23 – 2024-01-24 (×4): 1 via ORAL
  Filled 2024-01-23 (×4): qty 1

## 2024-01-23 MED ORDER — IRBESARTAN 150 MG PO TABS
150.0000 mg | ORAL_TABLET | Freq: Every day | ORAL | Status: DC
  Administered 2024-01-24 – 2024-01-25 (×2): 150 mg via ORAL
  Filled 2024-01-23 (×2): qty 1

## 2024-01-23 MED ORDER — HYDROMORPHONE HCL 1 MG/ML IJ SOLN: 1.0000 mg | Freq: Once | INTRAMUSCULAR | Status: DC

## 2024-01-23 MED ORDER — VANCOMYCIN HCL 2000 MG/400ML IV SOLN
2000.0000 mg | Freq: Two times a day (BID) | INTRAVENOUS | Status: DC
  Administered 2024-01-23 – 2024-01-24 (×2): 2000 mg via INTRAVENOUS
  Filled 2024-01-23 (×4): qty 400

## 2024-01-23 MED ORDER — MORPHINE SULFATE (PF) 2 MG/ML IV SOLN
2.0000 mg | INTRAVENOUS | Status: DC | PRN
  Administered 2024-01-24: 2 mg via INTRAVENOUS
  Filled 2024-01-23: qty 1

## 2024-01-23 MED ORDER — ACETAMINOPHEN 325 MG PO TABS
650.0000 mg | ORAL_TABLET | Freq: Four times a day (QID) | ORAL | Status: DC | PRN
  Administered 2024-01-24: 650 mg via ORAL
  Filled 2024-01-23: qty 2

## 2024-01-23 MED ORDER — IBUPROFEN 800 MG PO TABS
800.0000 mg | ORAL_TABLET | Freq: Once | ORAL | Status: AC
  Administered 2024-01-23: 800 mg via ORAL
  Filled 2024-01-23: qty 1

## 2024-01-23 MED ORDER — VALSARTAN-HYDROCHLOROTHIAZIDE 160-25 MG PO TABS: 1.0000 | ORAL_TABLET | Freq: Every day | ORAL | Status: DC

## 2024-01-23 MED ORDER — PIPERACILLIN-TAZOBACTAM 3.375 G IVPB 30 MIN
3.3750 g | Freq: Once | INTRAVENOUS | Status: AC
  Administered 2024-01-23: 3.375 g via INTRAVENOUS
  Filled 2024-01-23: qty 50

## 2024-01-23 MED ORDER — HYDROCHLOROTHIAZIDE 25 MG PO TABS
25.0000 mg | ORAL_TABLET | Freq: Every day | ORAL | Status: DC
  Administered 2024-01-24 – 2024-01-25 (×2): 25 mg via ORAL
  Filled 2024-01-23 (×2): qty 1

## 2024-01-23 MED ORDER — PIPERACILLIN-TAZOBACTAM 3.375 G IVPB 30 MIN: 3.3750 g | Freq: Once | INTRAVENOUS | Status: DC

## 2024-01-23 MED ORDER — SODIUM CHLORIDE 0.9 % IV BOLUS
1000.0000 mL | Freq: Once | INTRAVENOUS | Status: AC
  Administered 2024-01-23: 1000 mL via INTRAVENOUS

## 2024-01-23 NOTE — ED Triage Notes (Signed)
 Pt c/o urinary retention starting this afternoon and intermittent rectal pain since 12/31.  Pain score 7/10.  Pt has not taken anything for symptoms.  Hx of kidney stones.

## 2024-01-23 NOTE — Progress Notes (Signed)
 Pharmacy Antibiotic Note  [name] West is a 44 y.o. male admitted on 01/23/2024 with sepsis and retrorectal abscess.  Pharmacy has been consulted for vancomycin  and Zosyn  dosing.  Plan: Vancomycin  2000 mg IV Q 12 hrs. Goal AUC 400-550. Expected AUC: 516 SCr used: 0.82 actual but rounded up to 1 to better estimate GFR Zosyn  3.375 g EI q 8 hours F/U renal function, plans for antibiotics    Height: 5' 9 (175.3 cm) Weight: 108.4 kg (239 lb) IBW/kg (Calculated) : 70.7  Temp (24hrs), Avg:99.2 F (37.3 C), Min:98.3 F (36.8 C), Max:100 F (37.8 C)  Recent Labs  Lab 01/23/24 1630  WBC 23.2*  CREATININE 0.82    Estimated Creatinine Clearance: 139.5 mL/min (by C-G formula based on SCr of 0.82 mg/dL).    Allergies[1]  Thank you for allowing pharmacy to be a part of this patients care.  Bari Hamilton D 01/23/2024 9:50 PM     [1] No Known Allergies

## 2024-01-23 NOTE — ED Provider Notes (Signed)
 " [facility] EMERGENCY DEPARTMENT AT [facility] Provider Note   CSN: [id number] Arrival date & time: 01/23/24  1433     Patient presents with: Urinary Retention and Rectal Pain   [name] West is a 44 y.o. male who presents with urinary symptoms for the past 12 days.  The patient reports urinary retention and discomfort without dysuria, urinary frequency, urgency, and suprapubic discomfort or flank pain radiating to the groin.  Patient states that on 01/11/2024 he had a large bowel movement where he felt like he needed to bear down and feels like he irritated some hemorrhoids and developed mild urinary retention.  Since then he has used hemorrhoid cream and those have seemed to go away.  Patient states the feeling of urinary retention has stayed, where he feels like he needs to push to get urine out.  Patient also reports wax/wane constipation that presents with some rectal pain-patient states this is not as normal as his stools are normally soft.  Patient denies any hematochezia or melena.  Patient denies any gross hematuria, testicular pain, or recent trauma.  There is no history of recent urological procedures.  The patient reports prior history of nephrolithiasis required surgery however this does not feel the same. The patient is in no acute distress.    HPI     Prior to Admission medications  Medication Sig Start Date End Date Taking? Authorizing Provider  amLODipine  (NORVASC ) 5 MG tablet Take 1 tablet (5 mg total) by mouth daily. 07/25/23   [name], MD  Multiple Vitamins-Minerals (MULTIVITAMIN WITH MINERALS) tablet Take 1 tablet by mouth daily.    [provider]  potassium chloride  (KLOR-CON  M10) 10 MEQ tablet Take 1 tablet (10 mEq total) by mouth daily. 07/25/23   [name], MD  sertraline  (ZOLOFT ) 100 MG tablet Take 1 tablet (100 mg total) by mouth daily. 07/25/23   [name], MD  valsartan -hydrochlorothiazide  (DIOVAN -HCT) 160-25 MG tablet Take  1 tablet by mouth daily. 07/25/23   [name], MD    Allergies: Patient has no known allergies.    Review of Systems  Genitourinary:  Positive for decreased urine volume.    Updated Vital Signs BP (!) 148/64 (BP Location: Left Arm)   Pulse 78   Temp 98.3 F (36.8 C)   Resp 17   Ht 5' 9 (1.753 m)   Wt 108.4 kg   SpO2 96%   BMI 35.29 kg/m   Physical Exam Vitals and nursing note reviewed.  Constitutional:      General: He is not in acute distress.    Appearance: Normal appearance.  HENT:     Head: Normocephalic and atraumatic.  Eyes:     Extraocular Movements: Extraocular movements intact.     Conjunctiva/sclera: Conjunctivae normal.     Pupils: Pupils are equal, round, and reactive to light.  Cardiovascular:     Rate and Rhythm: Normal rate and regular rhythm.     Pulses: Normal pulses.  Pulmonary:     Effort: Pulmonary effort is normal. No respiratory distress.  Abdominal:     General: Abdomen is flat.     Palpations: Abdomen is soft.     Tenderness: There is no abdominal tenderness.     Comments: Patient denies any tenderness to suprapubic palpation.  There is no CVA tenderness, guarding, rebound or distention.  Musculoskeletal:        General: Normal range of motion.     Cervical back: Normal range  of motion.  Skin:    General: Skin is warm and dry.     Capillary Refill: Capillary refill takes less than 2 seconds.  Neurological:     General: No focal deficit present.     Mental Status: He is alert. Mental status is at baseline.  Psychiatric:        Mood and Affect: Mood normal.     (all labs ordered are listed, but only abnormal results are displayed) Labs Reviewed  URINALYSIS, ROUTINE W REFLEX MICROSCOPIC - Abnormal; Notable for the following components:      Result Value   APPearance CLOUDY (*)    Protein, ur 30 (*)    Bacteria, UA RARE (*)    All other components within normal limits  BASIC METABOLIC PANEL WITH GFR - Abnormal; Notable for the  following components:   Glucose, Bld 101 (*)    All other components within normal limits  CBC - Abnormal; Notable for the following components:   WBC 23.2 (*)    All other components within normal limits  HIV ANTIBODY (ROUTINE TESTING W REFLEX)  BASIC METABOLIC PANEL WITH GFR  CBC    EKG: None  Radiology: CT ABDOMEN PELVIS W CONTRAST Result Date: 01/23/2024 EXAM: CT ABDOMEN AND PELVIS WITH CONTRAST 01/23/2024 07:03:10 PM TECHNIQUE: CT of the abdomen and pelvis was performed with the administration of 100 mL of iohexol  (OMNIPAQUE ) 300 MG/ML solution. Multiplanar reformatted images are provided for review. Automated exposure control, iterative reconstruction, and/or weight-based adjustment of the mA/kV was utilized to reduce the radiation dose to as low as reasonably achievable. COMPARISON: 03/21/2012 CLINICAL HISTORY: Abdominal pain, acute, nonlocalized. FINDINGS: LOWER CHEST: The bases are clear. LIVER: The liver is unremarkable. GALLBLADDER AND BILE DUCTS: Gallbladder is unremarkable. No biliary ductal dilatation. SPLEEN: No acute abnormality. PANCREAS: No acute abnormality. ADRENAL GLANDS: No acute abnormality. KIDNEYS, URETERS AND BLADDER: Tiny left renal cysts are noted. Per consensus, no follow-up is needed for simple Bosniak type 1 and 2 renal cysts, unless the patient has a malignancy history or risk factors. No obstructive changes are seen. No stones in the kidneys or ureters. The bladder is partially distended. GI AND BOWEL: Small bowel and stomach are unremarkable. No obstructive changes of the colon are noted. The appendix is within normal limits. Inflammatory changes are noted deep in the pelvis in the perirectal fat without significant wall thickening. Some decreased attenuation is noted along the posterior rectum/anus best seen on image number 81 of series 2, which measures approximately 2.6 x 2.4 cm. This likely represents a small retrorectal abscess. Correlate with physical exam.  Alternatively, thrombosed hemorrhoids could present in this fashion. PERITONEUM AND RETROPERITONEUM: No ascites. No free air. VASCULATURE: Aorta is normal in caliber. LYMPH NODES: No lymphadenopathy. REPRODUCTIVE ORGANS: The prostate is within normal limits. BONES AND SOFT TISSUES: No acute osseous abnormality. No focal soft tissue abnormality. IMPRESSION: 1. Decreased attenuation along the distal rectum/anus measuring approximately 2.6 x 2.4 cm, favored to represent a small retrorectal abscess; thrombosed hemorrhoids could have a similar appearance, and correlation with physical examination is recommended. 2. Inflammatory changes in the deep pelvis perirectal fat without significant wall thickening. Electronically signed by: Oneil Devonshire MD MD 01/23/2024 08:17 PM EST RP Workstation: [id number]     Procedures   Medications Ordered in the ED  HYDROmorphone  (DILAUDID ) injection 1 mg (0 mg Intravenous Hold 01/23/24 2136)  ondansetron  (ZOFRAN ) injection 4 mg (has no administration in time range)  enoxaparin  (LOVENOX ) injection 55 mg (has no administration  in time range)  amLODipine  (NORVASC ) tablet 5 mg (has no administration in time range)  sertraline  (ZOLOFT ) tablet 100 mg (has no administration in time range)  valsartan -hydrochlorothiazide  (DIOVAN -HCT) 160-25 MG per tablet 1 tablet (has no administration in time range)  acetaminophen  (TYLENOL ) tablet 650 mg (has no administration in time range)  ondansetron  (ZOFRAN ) injection 4 mg (has no administration in time range)  morphine  (PF) 2 MG/ML injection 2 mg (has no administration in time range)  oxyCODONE -acetaminophen  (PERCOCET/ROXICET) 5-325 MG per tablet 1 tablet (has no administration in time range)  vancomycin  (VANCOREADY) IVPB 2000 mg/400 mL (has no administration in time range)  piperacillin -tazobactam (ZOSYN ) IVPB 3.375 g (has no administration in time range)  ibuprofen  (ADVIL ) tablet 800 mg (800 mg Oral Given 01/23/24 1640)  iohexol   (OMNIPAQUE ) 300 MG/ML solution 100 mL (100 mLs Intravenous Contrast Given 01/23/24 1847)  oxyCODONE -acetaminophen  (PERCOCET/ROXICET) 5-325 MG per tablet 1 tablet (1 tablet Oral Given 01/23/24 1932)  piperacillin -tazobactam (ZOSYN ) IVPB 3.375 g (3.375 g Intravenous New Bag/Given 01/23/24 2114)  sodium chloride  0.9 % bolus 1,000 mL (1,000 mLs Intravenous New Bag/Given 01/23/24 2108)                                  Medical Decision Making Amount and/or Complexity of Data Reviewed Labs: ordered. Decision-making details documented in ED Course. Radiology: ordered. Decision-making details documented in ED Course.  Risk Prescription drug management. Decision regarding hospitalization.   Patient presents to the ED for: Urinary retention This involves an extensive number of treatment options Differential diagnosis includes:  Prostatitis, BPH UTI Obstruction Co-morbid conditions: IBS, hypertension, nephrolithiasis, gout  Additional history/records obtained and reviewed: Additional history obtained from  outside medical records External records from outside source obtained and reviewed including several PCP records.  Clinical Course as of 01/23/24 2202  Mon Jan 23, 2024  1708 Temp: 100 F (37.8 C) Afebrile, vital stable, patient in no acute distress [ML]  1709 CBC(!) Large leukocytosis-23.2 [ML]  1739 Basic metabolic panel(!) No acute findings [ML]  1740 Urinalysis, Routine w reflex microscopic -Urine, Clean Catch(!) No acute findings [ML]  1821 150cc bladder scan post-void [ML]  2024 CT ABDOMEN PELVIS W CONTRAST Possible retrorectal abscess [ML]  2047 Jenkins -surgery will see in the a.m. [ML]  2152 Patient given ibuprofen , oxycodone , hydromorphone  for symptomatic relief-well-tolerated [ML]  2152 Patient started on Zosyn  per general surgery [ML]    Clinical Course User Index [ML] Willma Duwaine CROME, PA    Data Reviewed / Actions Taken: Labs ordered/reviewed with my independent  interpretation in ED course above. Imaging ordered/reviewed with my independent interpretation in ED course above. I agree with the radiologists interpretation.   Management / Treatments: See ED course above for medications, treatments administered, and clinical rationale.   Reevaluation of the patient after these medicines showed that the patient improved I have reviewed the patients home medicines and have made adjustments as needed  ED Course / Reassessments: Problem List:  44 year old male presented for urinary retention and rectal pain. Initial assessment included history, physical exam, and review of prior medical records. Laboratory evaluation including urinalysis demonstrated no acute finding, and additional labs including CBC showed large leukocytosis. Imaging including CT abdomen demonstrated retrorectal abscess. The patient received analgesia and empiric antibiotic as indicated.  Based on CT findings, patient's urinary retention could be from mild prostate irritation by rectal abscess, patient was able to urinate during visit with some postvoid residual.  Patient may need urology follow-up if this continues post-abscess treatment. Given patients clinical presentation, laboratory findings, and imaging results, the patient to be admitted for IV antibiotics and further evaluation by general surgery tomorrow morning.   Consultations:  General Surgery-Jenkins MD Consult recommendations incorporated into plan: Will see patient in the morning for further evaluation Consultations:  Hospitalist - Djan MD Consult recommendations incorporated into plan: To admit patient for further evaluation.  Disposition: Disposition: Admission  Rationale for disposition: Need for further evaluation by general surgery The disposition plan and rationale were discussed with the patient at the bedside, all questions were addressed, and the patient demonstrated understanding.  This note was produced using Scientist, Forensic. While I have reviewed and verified all clinical information, transcription errors may remain.      Final diagnoses:  None    ED Discharge Orders     None          Willma Duwaine CROME, [place] 01/23/24 2202    Cleotilde Rogue, MD 01/23/24 2244  "

## 2024-01-23 NOTE — Progress Notes (Signed)
" °   01/23/24 1547  Spiritual Encounters  Type of Visit Initial  Care provided to: Patient;Significant other (Wife Diane)  Referral source Family  Reason for visit Routine spiritual support  OnCall Visit No   Reason for Visit: Chaplain responding to referral from Unit Director that her husband was In the ED  Description of Visit: Upon entering the room I found the Karen in bed with Diane there for support.  We spoke briefly about the symptoms and his suspicion that it's a kidney stone.   Chaplain provided compassionate care and ultimately offered the ritual of prayer   Plan of Care: No further Spiritual Care interventions planned unless Pt is admitted.  Maude Roll, MDiv Chaplain, [facility] [email or web address] [phone] 01/23/2024 3:49 PM  "

## 2024-01-23 NOTE — H&P (Signed)
 " History and Physical    Patient: [name] West DOB: Sep 16, 1980 DOA: 01/23/2024 DOS: the patient was seen and examined on 01/23/2024 PCP: [name], MD  Patient coming from: Home  Chief Complaint: Rectal pain Chief Complaint  Patient presents with   Urinary Retention   Rectal Pain   HPI: [name] West is a 44 y.o. male with medical history significant of anxiety, gout, hypertension, IBS, OSA has been otherwise well until the 31st December when when he started experiencing some rectal pain.  Rectal pain of intensity AV2 with pain making it difficult for him to walk or even sit down.  Pain was relieved by pain medication administered in the emergency room.  According to patient he has been suffering from rectal hemorrhoid for the last 15 years.  His hemorrhoids have been otherwise controlled.  He has not had any recent constipation or diarrhea.  Denies nausea vomiting chest pain cough or abdominal pain.  The rectal pain has become more persistent within the last 1 week and became unbearable today and therefore decided to come to the emergency room for further management.   ED course: Upon arrival to the emergency room patient had temperature 100, respiratory rate 18, pulse 66 and blood pressure 157/87 saturating 97% on room air.  CT scan of the abdomen and pelvis showing findings of retrorectal abscess.  ED physician subsequently discussed the case with general surgeon who recommended admission and they will consult tomorrow.  Given above concerns hospitalist service was therefore contacted to admit patient for further management.  Review of Systems: As mentioned in the history of present illness. All other systems reviewed and are negative. Past Medical History:  Diagnosis Date   Anxiety    Gout    History of kidney stones    passed 1   HNP (herniated nucleus pulposus), lumbar    HTN (hypertension)    Hx of low back pain    IBS (irritable bowel syndrome)    Joint pain     Leg edema    Sleep apnea    Past Surgical History:  Procedure Laterality Date   ANTERIOR CERVICAL DECOMP/DISCECTOMY FUSION N/A 07/07/2022   Procedure: ACDF - C5-C6 - C6-C7;  Surgeon: Onetha Kuba, MD;  Location: [facility] OR;  Service: Neurosurgery;  Laterality: N/A;  3C   BACK SURGERY     CARPAL TUNNEL RELEASE Right 07/07/2022   Procedure: RIGHT CARPAL TUNNEL RELEASE;  Surgeon: Onetha Kuba, MD;  Location: [facility] OR;  Service: Neurosurgery;  Laterality: Right;   KIDNEY STONE SURGERY     LUMBAR LAMINECTOMY/DECOMPRESSION MICRODISCECTOMY Right 02/21/2018   Procedure: Right Lumbar Three-Four Lumbar Four-Five Microdiscectomy;  Surgeon: Unice Pac, MD;  Location: [facility] OR;  Service: Neurosurgery;  Laterality: Right;  Right Lumbar Three-Four Lumbar Four-Five Microdiscectomy   Social History:  reports that he has never smoked. He has never used smokeless tobacco. He reports current alcohol use. He reports that he does not use drugs.  Allergies[1]  Family History  Problem Relation Age of Onset   Cancer Mother        Lung   Hyperlipidemia Father    Heart disease Father    Kidney Stones Father    Heart disease Paternal Grandfather 56       died of MI    Prior to Admission medications  Medication Sig Start Date End Date Taking? Authorizing Provider  amLODipine  (NORVASC ) 5 MG tablet Take 1 tablet (5 mg total) by mouth daily. 07/25/23   Luking,  Glendia LABOR, MD  Multiple Vitamins-Minerals (MULTIVITAMIN WITH MINERALS) tablet Take 1 tablet by mouth daily.    [provider]  potassium chloride  (KLOR-CON  M10) 10 MEQ tablet Take 1 tablet (10 mEq total) by mouth daily. 07/25/23   [name], MD  sertraline  (ZOLOFT ) 100 MG tablet Take 1 tablet (100 mg total) by mouth daily. 07/25/23   [name], MD  valsartan -hydrochlorothiazide  (DIOVAN -HCT) 160-25 MG tablet Take 1 tablet by mouth daily. 07/25/23   [name], MD    Physical Exam: Vitals:   01/23/24 1442 01/23/24 1443 01/23/24 1645 01/23/24  1815  BP:  (!) 157/87 (!) 158/76 131/72  Pulse:  66 64 (!) 56  Resp:  18 18 17   Temp:  100 F (37.8 C)    TempSrc:  Oral    SpO2:  97% 98% 98%  Weight: 108.4 kg     Height: 5' 9 (1.753 m)      General: Middle-age male, in no acute distress CNS: S1, S2 are normal CVS: Alert and oriented x 3 Respiratory: Clear to auscultation bilaterally Musculoskeletal: Lower extremities without edema Abdomen: Nontender no masses palpable, mild tenderness noted in the left flank area  Data Reviewed: CT scan of the abdomen showing retrorectal abscess measuring 2.6 x 2.4 cm    Latest Ref Rng & Units 01/23/2024    4:30 PM 07/07/2022    5:41 AM 08/11/2018   10:41 AM  CBC  WBC 4.0 - 10.5 K/uL 23.2  11.0  10.0   Hemoglobin 13.0 - 17.0 g/dL 86.6  86.5  84.7   Hematocrit 39.0 - 52.0 % 41.4  41.3  46.7   Platelets 150 - 400 K/uL 295  270  293        Latest Ref Rng & Units 01/23/2024    4:30 PM 07/07/2022    5:41 AM 09/01/2021    8:04 AM  BMP  Glucose 70 - 99 mg/dL 898  96  95   BUN 6 - 20 mg/dL 11  17  14    Creatinine 0.61 - 1.24 mg/dL 9.17  9.17  9.23   BUN/Creat Ratio 9 - 20   18   Sodium 135 - 145 mmol/L 141  138  145   Potassium 3.5 - 5.1 mmol/L 4.2  3.9  4.2   Chloride 98 - 111 mmol/L 103  103  103   CO2 22 - 32 mmol/L 24  28  23    Calcium 8.9 - 10.3 mg/dL 9.2  8.9  9.4       Assessment and Plan:  Retrorectal abscess CT scan of the abdomen and pelvis confirmed the above Case discussed with general surgeon Dr. Mavis Continue broad-spectrum antibiotics We will keep n.p.o. after midnight for possible abscess drainage tomorrow Continue as needed pain medication  Essential hypertension Resume home dose of amlodipine , valsartan  as well as hydrochlorothiazide   Anxiety disorder Continue sertraline   OSA Ordered CPAP at night  DVT prophylaxis-placed on Lovenox    Advance Care Planning:   Code Status: Prior full code  Consults: General Surgery  Family Communication: Discussed with  patient's wife present at bedside  Severity of Illness: The appropriate patient status for this patient is INPATIENT. Inpatient status is judged to be reasonable and necessary in order to provide the required intensity of service to ensure the patient's safety. The patient's presenting symptoms, physical exam findings, and initial radiographic and laboratory data in the context of their chronic comorbidities is felt to place them at high risk  for further clinical deterioration. Furthermore, it is not anticipated that the patient will be medically stable for discharge from the hospital within 2 midnights of admission.   * I certify that at the point of admission it is my clinical judgment that the patient will require inpatient hospital care spanning beyond 2 midnights from the point of admission due to high intensity of service, high risk for further deterioration and high frequency of surveillance required.*  Author: Drue ONEIDA Potter, MD 01/23/2024 8:50 PM  For on call review [email or web address].      [1] No Known Allergies  "

## 2024-01-24 ENCOUNTER — Inpatient Hospital Stay (HOSPITAL_COMMUNITY)

## 2024-01-24 DIAGNOSIS — K612 Anorectal abscess: Secondary | ICD-10-CM | POA: Diagnosis not present

## 2024-01-24 LAB — BASIC METABOLIC PANEL WITH GFR
Anion gap: 9 (ref 5–15)
BUN: 12 mg/dL (ref 6–20)
CO2: 29 mmol/L (ref 22–32)
Calcium: 8.5 mg/dL — ABNORMAL LOW (ref 8.9–10.3)
Chloride: 104 mmol/L (ref 98–111)
Creatinine, Ser: 0.68 mg/dL (ref 0.61–1.24)
GFR, Estimated: >60 mL/min
Glucose, Bld: 105 mg/dL — ABNORMAL HIGH (ref 70–99)
Potassium: 3.9 mmol/L (ref 3.5–5.1)
Sodium: 142 mmol/L (ref 135–145)

## 2024-01-24 LAB — CBC
HCT: 41.6 % (ref 39.0–52.0)
Hemoglobin: 13.3 g/dL (ref 13.0–17.0)
MCH: 27.9 pg (ref 26.0–34.0)
MCHC: 32 g/dL (ref 30.0–36.0)
MCV: 87.4 fL (ref 80.0–100.0)
Platelets: 289 K/uL (ref 150–400)
RBC: 4.76 MIL/uL (ref 4.22–5.81)
RDW: 13.6 % (ref 11.5–15.5)
WBC: 20.5 K/uL — ABNORMAL HIGH (ref 4.0–10.5)
nRBC: 0 % (ref 0.0–0.2)

## 2024-01-24 LAB — HIV ANTIBODY (ROUTINE TESTING W REFLEX): HIV Screen 4th Generation wRfx: NONREACTIVE

## 2024-01-24 LAB — LIPID PANEL

## 2024-01-24 MED ORDER — SODIUM CHLORIDE 0.9 % IV SOLN: INTRAVENOUS | Status: AC

## 2024-01-24 MED ORDER — CHLORHEXIDINE GLUCONATE CLOTH 2 % EX PADS
6.0000 | MEDICATED_PAD | Freq: Every day | CUTANEOUS | Status: DC
  Administered 2024-01-24 – 2024-01-25 (×2): 6 via TOPICAL

## 2024-01-24 MED ORDER — VANCOMYCIN HCL 1500 MG/300ML IV SOLN
1500.0000 mg | Freq: Two times a day (BID) | INTRAVENOUS | Status: DC
  Administered 2024-01-24 – 2024-01-25 (×2): 1500 mg via INTRAVENOUS
  Filled 2024-01-24 (×2): qty 300

## 2024-01-24 MED ORDER — HYDRALAZINE HCL 20 MG/ML IJ SOLN: 10.0000 mg | INTRAMUSCULAR | Status: DC | PRN

## 2024-01-24 MED ORDER — MUPIROCIN 2 % EX OINT
1.0000 | TOPICAL_OINTMENT | Freq: Two times a day (BID) | CUTANEOUS | Status: DC
  Administered 2024-01-25 (×2): 1 via NASAL
  Filled 2024-01-24 (×2): qty 22

## 2024-01-24 MED ORDER — GLUCAGON HCL RDNA (DIAGNOSTIC) 1 MG IJ SOLR: 1.0000 mg | INTRAMUSCULAR | Status: DC | PRN

## 2024-01-24 MED ORDER — METOPROLOL TARTRATE 5 MG/5ML IV SOLN: 5.0000 mg | INTRAVENOUS | Status: DC | PRN

## 2024-01-24 MED ORDER — IPRATROPIUM-ALBUTEROL 0.5-2.5 (3) MG/3ML IN SOLN: 3.0000 mL | RESPIRATORY_TRACT | Status: DC | PRN

## 2024-01-24 NOTE — Progress Notes (Addendum)
 Nurse at bedside,patient alert and oriented times four. Patient c/o rectal pain rated a 8,a discomfort,pressure type of pain that's intermittent.Patient received Oxycodone  one tablet by mouth per MAR prn for pain.Plan of care on going.

## 2024-01-24 NOTE — Progress Notes (Signed)
 " PROGRESS NOTE    [name] West  FMW:[id number] DOB: 1980/08/31 DOA: 01/23/2024 PCP: [name], MD    Brief Narrative:  44 year old with history of anxiety, gout, HTN, IBS, OSA has been experiencing rectal pain for almost 2 weeks prior to admission.  CT scan showed concerns of retrorectal abscess.  General surgery was consulted.   Assessment & Plan:   Retrorectal abscess CT scan confirms retrorectal abscess.  Continue empiric antibiotics, general surgery has been consulted.  Dysuria - Having difficulty to void.  Bladder scan shows greater than 600 cc.  When attempted straight cath, nursing staff reports no return.  Will order renal ultrasound.  UA is negative   Essential hypertension Resume home dose of amlodipine , valsartan   IV fluids.  Hold HCTZ   Anxiety disorder Continue sertraline    OSA Ordered CPAP at night   DVT prophylaxis: Lovenox     Code Status: Full Code Family Communication:   Status is: Inpatient Remains inpatient appropriate because: Ongoing management for rectal abscess.   PT Follow up Recs:   Subjective: Seen at bedside, tells me rectal pain is little better.   Examination:  General exam: Appears calm and comfortable  Respiratory system: Clear to auscultation. Respiratory effort normal. Cardiovascular system: S1 & S2 heard, RRR. No JVD, murmurs, rubs, gallops or clicks. No pedal edema. Gastrointestinal system: Abdomen is nondistended, soft and nontender. No organomegaly or masses felt. Normal bowel sounds heard. Central nervous system: Alert and oriented. No focal neurological deficits. Extremities: Symmetric 5 x 5 power. Skin: No rashes, lesions or ulcers Psychiatry: Judgement and insight appear normal. Mood & affect appropriate.                Diet Orders (From admission, onward)     Start     Ordered   01/24/24 0838  Diet regular Fluid consistency: Thin  Diet effective now       Question:  Fluid consistency:  Answer:  Thin    01/24/24 0837            Objective: Vitals:   01/23/24 2204 01/24/24 0213 01/24/24 0528 01/24/24 0855  BP: (!) 159/64 (!) 118/59 134/69 (!) 145/72  Pulse: (!) 56 (!) 51 (!) 50 (!) 51  Resp: 18     Temp: 98.2 F (36.8 C) 98 F (36.7 C) 98.4 F (36.9 C) 97.9 F (36.6 C)  TempSrc: Oral Oral Oral Oral  SpO2: 98% 98% 98% 96%  Weight: 111.6 kg     Height: 5' 9 (1.753 m)       Intake/Output Summary (Last 24 hours) at 01/24/2024 1140 Last data filed at 01/24/2024 0300 Gross per 24 hour  Intake 409.5 ml  Output --  Net 409.5 ml   Filed Weights   01/23/24 1442 01/23/24 2204  Weight: 108.4 kg 111.6 kg    Scheduled Meds:  amLODipine   5 mg Oral Daily   enoxaparin  (LOVENOX ) injection  0.5 mg/kg Subcutaneous Q24H   irbesartan   150 mg Oral Daily   And   hydrochlorothiazide   25 mg Oral Daily    HYDROmorphone  (DILAUDID ) injection  1 mg Intravenous Once   ondansetron  (ZOFRAN ) IV  4 mg Intravenous Once   sertraline   100 mg Oral Daily   Continuous Infusions:  sodium chloride  75 mL/hr at 01/24/24 0859   piperacillin -tazobactam (ZOSYN )  IV 3.375 g (01/24/24 0220)   vancomycin       Nutritional status     Body mass index is 36.33 kg/m.  Data Reviewed:  CBC: Recent Labs  Lab 01/23/24 1630 01/24/24 0500  WBC 23.2* 20.5*  HGB 13.3 13.3  HCT 41.4 41.6  MCV 87.3 87.4  PLT 295 289   Basic Metabolic Panel: Recent Labs  Lab 01/23/24 1630 01/24/24 0500  NA 141 142  K 4.2 3.9  CL 103 104  CO2 24 29  GLUCOSE 101* 105*  BUN 11 12  CREATININE 0.82 0.68  CALCIUM 9.2 8.5*   GFR: Estimated Creatinine Clearance: 145.2 mL/min (by C-G formula based on SCr of 0.68 mg/dL). Liver Function Tests: No results for input(s): AST, ALT, ALKPHOS, BILITOT, PROT, ALBUMIN in the last 168 hours. No results for input(s): LIPASE, AMYLASE in the last 168 hours. No results for input(s): AMMONIA in the last 168 hours. Coagulation Profile: No results for input(s):  INR, PROTIME in the last 168 hours. Cardiac Enzymes: No results for input(s): CKTOTAL, CKMB, CKMBINDEX, TROPONINI in the last 168 hours. BNP (last 3 results) No results for input(s): PROBNP in the last 8760 hours. HbA1C: No results for input(s): HGBA1C in the last 72 hours. CBG: No results for input(s): GLUCAP in the last 168 hours. Lipid Profile: No results for input(s): CHOL, HDL, LDLCALC, TRIG, CHOLHDL, LDLDIRECT in the last 72 hours. Thyroid  Function Tests: No results for input(s): TSH, T4TOTAL, FREET4, T3FREE, THYROIDAB in the last 72 hours. Anemia Panel: No results for input(s): VITAMINB12, FOLATE, FERRITIN, TIBC, IRON, RETICCTPCT in the last 72 hours. Sepsis Labs: No results for input(s): PROCALCITON, LATICACIDVEN in the last 168 hours.  No results found for this or any previous visit (from the past 240 hours).       Radiology Studies: CT ABDOMEN PELVIS W CONTRAST Result Date: 01/23/2024 EXAM: CT ABDOMEN AND PELVIS WITH CONTRAST 01/23/2024 07:03:10 PM TECHNIQUE: CT of the abdomen and pelvis was performed with the administration of 100 mL of iohexol  (OMNIPAQUE ) 300 MG/ML solution. Multiplanar reformatted images are provided for review. Automated exposure control, iterative reconstruction, and/or weight-based adjustment of the mA/kV was utilized to reduce the radiation dose to as low as reasonably achievable. COMPARISON: 03/21/2012 CLINICAL HISTORY: Abdominal pain, acute, nonlocalized. FINDINGS: LOWER CHEST: The bases are clear. LIVER: The liver is unremarkable. GALLBLADDER AND BILE DUCTS: Gallbladder is unremarkable. No biliary ductal dilatation. SPLEEN: No acute abnormality. PANCREAS: No acute abnormality. ADRENAL GLANDS: No acute abnormality. KIDNEYS, URETERS AND BLADDER: Tiny left renal cysts are noted. Per consensus, no follow-up is needed for simple Bosniak type 1 and 2 renal cysts, unless the patient has a malignancy  history or risk factors. No obstructive changes are seen. No stones in the kidneys or ureters. The bladder is partially distended. GI AND BOWEL: Small bowel and stomach are unremarkable. No obstructive changes of the colon are noted. The appendix is within normal limits. Inflammatory changes are noted deep in the pelvis in the perirectal fat without significant wall thickening. Some decreased attenuation is noted along the posterior rectum/anus best seen on image number 81 of series 2, which measures approximately 2.6 x 2.4 cm. This likely represents a small retrorectal abscess. Correlate with physical exam. Alternatively, thrombosed hemorrhoids could present in this fashion. PERITONEUM AND RETROPERITONEUM: No ascites. No free air. VASCULATURE: Aorta is normal in caliber. LYMPH NODES: No lymphadenopathy. REPRODUCTIVE ORGANS: The prostate is within normal limits. BONES AND SOFT TISSUES: No acute osseous abnormality. No focal soft tissue abnormality. IMPRESSION: 1. Decreased attenuation along the distal rectum/anus measuring approximately 2.6 x 2.4 cm, favored to represent a small retrorectal abscess; thrombosed hemorrhoids could have a similar appearance, and  correlation with physical examination is recommended. 2. Inflammatory changes in the deep pelvis perirectal fat without significant wall thickening. Electronically signed by: Oneil Devonshire MD MD 01/23/2024 08:17 PM EST RP Workstation: [id number]           LOS: 1 day   Time spent= 35 mins    Burgess JAYSON Dare, MD [facility]  If 7PM-7AM, please contact night-coverage  01/24/2024, 11:40 AM  "

## 2024-01-24 NOTE — Hospital Course (Addendum)
 Brief Narrative:  44 year old with history of anxiety, gout, HTN, IBS, OSA has been experiencing rectal pain for almost 2 weeks prior to admission.  CT scan showed concerns of retrorectal abscess.  General surgery was consulted.   Assessment & Plan:   Retrorectal abscess CT scan confirms retrorectal abscess.  Continue empiric antibiotics, general surgery has been consulted.  Dysuria - Having difficulty to void.  Bladder scan shows greater than 600 cc.  When attempted straight cath, nursing staff reports no return.  Will order renal ultrasound.  UA is negative   Essential hypertension Resume home dose of amlodipine , valsartan   IV fluids.  Hold HCTZ   Anxiety disorder Continue sertraline    OSA Ordered CPAP at night   DVT prophylaxis: Lovenox     Code Status: Full Code Family Communication:   Status is: Inpatient Remains inpatient appropriate because: Ongoing management for rectal abscess.   PT Follow up Recs:   Subjective: Seen at bedside, tells me rectal pain is little better.   Examination:  General exam: Appears calm and comfortable  Respiratory system: Clear to auscultation. Respiratory effort normal. Cardiovascular system: S1 & S2 heard, RRR. No JVD, murmurs, rubs, gallops or clicks. No pedal edema. Gastrointestinal system: Abdomen is nondistended, soft and nontender. No organomegaly or masses felt. Normal bowel sounds heard. Central nervous system: Alert and oriented. No focal neurological deficits. Extremities: Symmetric 5 x 5 power. Skin: No rashes, lesions or ulcers Psychiatry: Judgement and insight appear normal. Mood & affect appropriate.

## 2024-01-24 NOTE — Consult Note (Signed)
 Reason for Consult:Abscess of anal and rectal regions Referring Physician: Drue Potter, MD    HPI: [name] West is a 44 year old male with past medical history of HTN, anxiety, gout, IBS, and OSA who presented to the ED yesterday with anal pain, trouble urinating, and straining with BM. Patient reports that on NYE of 2025, he had large BM and had anal pain since then. Patient used prep H wipes and gel which helped alleviate and resolve pain. However, patient had another large BM last Thursday-Friday and pain began again. Patient tried prep H gel again but has not helped relieve symptoms like before. Patient describes pain as consistent and heavy pressure in bottom that impacts his ability to walk and sit down. Reports pain is at 5-6 overall but 10/10 when attempting to have BM. Patient endorses constipation, and also reports urinary retention for the past couple days. Was able to urinate yesterday, but has not urinated today even though he experienced urge. Reports fatigue since symptoms began. Denies fever, chills, nausea, vomiting, blood in stool. Reports one possible occurrence of mucus in stool, but was not sure if it was mucus vs gel he was using.   Past Medical History:  Diagnosis Date   Anxiety    Gout    History of kidney stones    passed 1   HNP (herniated nucleus pulposus), lumbar    HTN (hypertension)    Hx of low back pain    IBS (irritable bowel syndrome)    Joint pain    Leg edema    Sleep apnea     Past Surgical History:  Procedure Laterality Date   ANTERIOR CERVICAL DECOMP/DISCECTOMY FUSION N/A 07/07/2022   Procedure: ACDF - C5-C6 - C6-C7;  Surgeon: Onetha Kuba, MD;  Location: [facility] OR;  Service: Neurosurgery;  Laterality: N/A;  3C   BACK SURGERY     CARPAL TUNNEL RELEASE Right 07/07/2022   Procedure: RIGHT CARPAL TUNNEL RELEASE;  Surgeon: Onetha Kuba, MD;  Location: [facility] OR;  Service: Neurosurgery;  Laterality: Right;   KIDNEY STONE SURGERY     LUMBAR LAMINECTOMY/DECOMPRESSION  MICRODISCECTOMY Right 02/21/2018   Procedure: Right Lumbar Three-Four Lumbar Four-Five Microdiscectomy;  Surgeon: Unice Pac, MD;  Location: [facility] OR;  Service: Neurosurgery;  Laterality: Right;  Right Lumbar Three-Four Lumbar Four-Five Microdiscectomy    Family History  Problem Relation Age of Onset   Cancer Mother        Lung   Hyperlipidemia Father    Heart disease Father    Kidney Stones Father    Heart disease Paternal Grandfather 62       died of MI    Social History:  reports that he has never smoked. He has never used smokeless tobacco. He reports current alcohol use. He reports that he does not use drugs.  Allergies: Allergies[1]  Medications: I have reviewed the patient's current medications.  Results for orders placed or performed during the hospital encounter of 01/23/24 (from the past 48 hours)  Urinalysis, Routine w reflex microscopic -Urine, Clean Catch     Status: Abnormal   Collection Time: 01/23/24  2:45 PM  Result Value Ref Range   Color, Urine YELLOW YELLOW   APPearance CLOUDY (A) CLEAR   Specific Gravity, Urine 1.020 1.005 - 1.030   pH 6.0 5.0 - 8.0   Glucose, UA NEGATIVE NEGATIVE mg/dL   Hgb urine dipstick NEGATIVE NEGATIVE   Bilirubin Urine NEGATIVE NEGATIVE   Ketones, ur NEGATIVE NEGATIVE mg/dL   Protein, ur 30 (  A) NEGATIVE mg/dL   Nitrite NEGATIVE NEGATIVE   Leukocytes,Ua NEGATIVE NEGATIVE   RBC / HPF 0-5 0 - 5 RBC/hpf   WBC, UA 0-5 0 - 5 WBC/hpf   Bacteria, UA RARE (A) NONE SEEN   Squamous Epithelial / HPF 0-5 0 - 5 /HPF   Mucus PRESENT     Comment: Performed at [facility], [address] [place], [place] [address]  Basic metabolic panel     Status: Abnormal   Collection Time: 01/23/24  4:30 PM  Result Value Ref Range   Sodium 141 135 - 145 mmol/L   Potassium 4.2 3.5 - 5.1 mmol/L   Chloride 103 98 - 111 mmol/L   CO2 24 22 - 32 mmol/L   Glucose, Bld 101 (H) 70 - 99 mg/dL    Comment: Glucose reference range applies only to samples taken after  fasting for at least 8 hours.   BUN 11 6 - 20 mg/dL   Creatinine, Ser 9.17 0.61 - 1.24 mg/dL   Calcium 9.2 8.9 - 89.6 mg/dL   GFR, Estimated >39 >39 mL/min    Comment: (NOTE) Calculated using the CKD-EPI Creatinine Equation (2021)    Anion gap 14 5 - 15    Comment: Performed at [facility], [address], [place], [place] [address]  CBC     Status: Abnormal   Collection Time: 01/23/24  4:30 PM  Result Value Ref Range   WBC 23.2 (H) 4.0 - 10.5 K/uL   RBC 4.74 4.22 - 5.81 MIL/uL   Hemoglobin 13.3 13.0 - 17.0 g/dL   HCT 58.5 60.9 - 47.9 %   MCV 87.3 80.0 - 100.0 fL   MCH 28.1 26.0 - 34.0 pg   MCHC 32.1 30.0 - 36.0 g/dL   RDW 86.3 88.4 - 84.4 %   Platelets 295 150 - 400 K/uL   nRBC 0.0 0.0 - 0.2 %    Comment: Performed at [facility], [address] [place], [place] [address]  HIV Antibody (routine testing w rflx)     Status: None   Collection Time: 01/24/24  5:00 AM  Result Value Ref Range   HIV Screen 4th Generation wRfx Non Reactive Non Reactive    Comment: Performed at [facility], [address], [place], [place] [address]  Basic metabolic panel     Status: Abnormal   Collection Time: 01/24/24  5:00 AM  Result Value Ref Range   Sodium 142 135 - 145 mmol/L   Potassium 3.9 3.5 - 5.1 mmol/L   Chloride 104 98 - 111 mmol/L   CO2 29 22 - 32 mmol/L   Glucose, Bld 105 (H) 70 - 99 mg/dL    Comment: Glucose reference range applies only to samples taken after fasting for at least 8 hours.   BUN 12 6 - 20 mg/dL   Creatinine, Ser 9.31 0.61 - 1.24 mg/dL   Calcium 8.5 (L) 8.9 - 10.3 mg/dL   GFR, Estimated >39 >39 mL/min    Comment: (NOTE) Calculated using the CKD-EPI Creatinine Equation (2021)    Anion gap 9 5 - 15    Comment: Performed at [facility], [address], [place], [place] [address]  CBC     Status: Abnormal   Collection Time: 01/24/24  5:00 AM  Result Value Ref Range   WBC 20.5 (H) 4.0 - 10.5 K/uL   RBC 4.76 4.22 - 5.81 MIL/uL   Hemoglobin 13.3 13.0 - 17.0  g/dL   HCT 58.3 60.9 -  52.0 %   MCV 87.4 80.0 - 100.0 fL   MCH 27.9 26.0 - 34.0 pg   MCHC 32.0 30.0 - 36.0 g/dL   RDW 86.3 88.4 - 84.4 %   Platelets 289 150 - 400 K/uL   nRBC 0.0 0.0 - 0.2 %    Comment: Performed at [facility], [address] [place], [place] [address]    CT ABDOMEN PELVIS W CONTRAST Result Date: 01/23/2024 EXAM: CT ABDOMEN AND PELVIS WITH CONTRAST 01/23/2024 07:03:10 PM TECHNIQUE: CT of the abdomen and pelvis was performed with the administration of 100 mL of iohexol  (OMNIPAQUE ) 300 MG/ML solution. Multiplanar reformatted images are provided for review. Automated exposure control, iterative reconstruction, and/or weight-based adjustment of the mA/kV was utilized to reduce the radiation dose to as low as reasonably achievable. COMPARISON: 03/21/2012 CLINICAL HISTORY: Abdominal pain, acute, nonlocalized. FINDINGS: LOWER CHEST: The bases are clear. LIVER: The liver is unremarkable. GALLBLADDER AND BILE DUCTS: Gallbladder is unremarkable. No biliary ductal dilatation. SPLEEN: No acute abnormality. PANCREAS: No acute abnormality. ADRENAL GLANDS: No acute abnormality. KIDNEYS, URETERS AND BLADDER: Tiny left renal cysts are noted. Per consensus, no follow-up is needed for simple Bosniak type 1 and 2 renal cysts, unless the patient has a malignancy history or risk factors. No obstructive changes are seen. No stones in the kidneys or ureters. The bladder is partially distended. GI AND BOWEL: Small bowel and stomach are unremarkable. No obstructive changes of the colon are noted. The appendix is within normal limits. Inflammatory changes are noted deep in the pelvis in the perirectal fat without significant wall thickening. Some decreased attenuation is noted along the posterior rectum/anus best seen on image number 81 of series 2, which measures approximately 2.6 x 2.4 cm. This likely represents a small retrorectal abscess. Correlate with physical exam. Alternatively, thrombosed hemorrhoids  could present in this fashion. PERITONEUM AND RETROPERITONEUM: No ascites. No free air. VASCULATURE: Aorta is normal in caliber. LYMPH NODES: No lymphadenopathy. REPRODUCTIVE ORGANS: The prostate is within normal limits. BONES AND SOFT TISSUES: No acute osseous abnormality. No focal soft tissue abnormality. IMPRESSION: 1. Decreased attenuation along the distal rectum/anus measuring approximately 2.6 x 2.4 cm, favored to represent a small retrorectal abscess; thrombosed hemorrhoids could have a similar appearance, and correlation with physical examination is recommended. 2. Inflammatory changes in the deep pelvis perirectal fat without significant wall thickening. Electronically signed by: Oneil Devonshire MD MD 01/23/2024 08:17 PM EST RP Workstation: [id number]    ROS:  Pertinent items are noted in HPI.  Blood pressure (!) 145/72, pulse (!) 51, temperature 97.9 F (36.6 C), temperature source Oral, resp. rate 18, height 5' 9 (1.753 m), weight 111.6 kg, SpO2 96%. Physical Exam:   General: Well appearing male lying comfortably in no acute distress Cardio: RRR, no rubs, murmurs, or gallops Pulm: Breathing comfortably on room air, no respiratory distress  Anal: Normal external appearance of anus. Tender, firm bulge inside dentate line   Assessment/Plan: Retrorectal abscess Patient continues to report consistent pain with trouble voiding and strain with BM. Afebrile. WBC elevated but has decreased from yesterday. CT abdomen pelvis revealed 2.6 x 2.4 cm retrorectal abscess. Given small size of abscess, no surgical intervention recommended today and recommend continuing broad-spectrum antibiotics and continue as needed pain medication. After another day of broad-spectrum antibiotics and as needed pain medication, will reassess patient tomorrow morning to determine of drainage of abscess is necessary.    Cozetta JINNY Pereyra 01/24/2024, 10:18 AM          [  1] No Known Allergies

## 2024-01-24 NOTE — Progress Notes (Signed)
 Patient was bladder scanned per Dr Burgess Amin's orders, results showed 609 ml's of urine in the bladder. New orders received. Plan of care on going.

## 2024-01-24 NOTE — Plan of Care (Signed)
   Problem: Education: Goal: Knowledge of General Education information will improve Description Including pain rating scale, medication(s)/side effects and non-pharmacologic comfort measures Outcome: Progressing   Problem: Education: Goal: Knowledge of General Education information will improve Description Including pain rating scale, medication(s)/side effects and non-pharmacologic comfort measures Outcome: Progressing

## 2024-01-24 NOTE — Progress Notes (Signed)
 Nurse at bedside, In and Out straight catheter size 16 french attempted,no urine to return. Patient did c/o some burning, also resistance was met.Patient statesI feel like I can go now on my own. Dr Burgess Dare notified.Plan of care on going.

## 2024-01-24 NOTE — Progress Notes (Addendum)
 Pharmacy Antibiotic Note  [name] West is a 44 y.o. male admitted on 01/23/2024 with sepsis and retrorectal abscess.  Pharmacy has been consulted for vancomycin  and Zosyn  dosing. BMI 36%, Scr 0.68   Plan: Change Vancomycin  1500 mg IV Q 12 hrs. Goal AUC 400-550. Expected AUC: 527 SCr used: 0.8( actual is 0.68) Zosyn  3.375 g EI q 8 hours F/U renal function, plans for antibiotics    Height: 5' 9 (175.3 cm) Weight: 111.6 kg (246 lb 0.5 oz) IBW/kg (Calculated) : 70.7  Temp (24hrs), Avg:98.5 F (36.9 C), Min:97.9 F (36.6 C), Max:100 F (37.8 C)  Recent Labs  Lab 01/23/24 1630 01/24/24 0500  WBC 23.2* 20.5*  CREATININE 0.82 0.68    Estimated Creatinine Clearance: 145.2 mL/min (by C-G formula based on SCr of 0.68 mg/dL).    Allergies[1]  Thank you for allowing pharmacy to be a part of this patients care.  Chaniya Genter L 01/24/2024 10:35 AM      [1] No Known Allergies

## 2024-01-24 NOTE — Progress Notes (Signed)
" °  Transition of Care (TOC) Screening Note   Patient Details  Name: [name] West Date of Birth: 1980/09/05   Transition of Care [facility]) CM/SW Contact:    Hoy DELENA Bigness, LCSW Phone Number: 01/24/2024, 9:01 AM    Transition of Care Department [facility]) has reviewed patient and no TOC needs have been identified at this time. We will continue to monitor patient advancement through interdisciplinary progression rounds. If new patient transition needs arise, please place a TOC consult.    01/24/24 0901  TOC Brief Assessment  Insurance and Status Reviewed  Patient has primary care physician Yes  Home environment has been reviewed From home w/ spouse  Prior level of function: Independent  Prior/Current Home Services No current home services  Social Drivers of Health Review SDOH reviewed no interventions necessary  Readmission risk has been reviewed Yes  Transition of care needs no transition of care needs at this time    "

## 2024-01-25 ENCOUNTER — Other Ambulatory Visit: Payer: Self-pay

## 2024-01-25 ENCOUNTER — Ambulatory Visit: Admitting: Family Medicine

## 2024-01-25 ENCOUNTER — Other Ambulatory Visit (HOSPITAL_COMMUNITY): Payer: Self-pay

## 2024-01-25 ENCOUNTER — Other Ambulatory Visit (HOSPITAL_BASED_OUTPATIENT_CLINIC_OR_DEPARTMENT_OTHER): Payer: Self-pay

## 2024-01-25 DIAGNOSIS — K612 Anorectal abscess: Secondary | ICD-10-CM | POA: Diagnosis not present

## 2024-01-25 DIAGNOSIS — E66812 Obesity, class 2: Secondary | ICD-10-CM

## 2024-01-25 LAB — BASIC METABOLIC PANEL WITH GFR
Anion gap: 12 (ref 5–15)
BUN: 10 mg/dL (ref 6–20)
CO2: 26 mmol/L (ref 22–32)
Calcium: 8.8 mg/dL — ABNORMAL LOW (ref 8.9–10.3)
Chloride: 104 mmol/L (ref 98–111)
Creatinine, Ser: 0.69 mg/dL (ref 0.61–1.24)
GFR, Estimated: >60 mL/min
Glucose, Bld: 99 mg/dL (ref 70–99)
Potassium: 3.7 mmol/L (ref 3.5–5.1)
Sodium: 141 mmol/L (ref 135–145)

## 2024-01-25 LAB — CBC
HCT: 39.3 % (ref 39.0–52.0)
Hemoglobin: 12.7 g/dL — ABNORMAL LOW (ref 13.0–17.0)
MCH: 27.9 pg (ref 26.0–34.0)
MCHC: 32.3 g/dL (ref 30.0–36.0)
MCV: 86.2 fL (ref 80.0–100.0)
Platelets: 312 K/uL (ref 150–400)
RBC: 4.56 MIL/uL (ref 4.22–5.81)
RDW: 13.7 % (ref 11.5–15.5)
WBC: 15.3 K/uL — ABNORMAL HIGH (ref 4.0–10.5)
nRBC: 0 % (ref 0.0–0.2)

## 2024-01-25 LAB — MAGNESIUM: Magnesium: 2.6 mg/dL — ABNORMAL HIGH (ref 1.7–2.4)

## 2024-01-25 LAB — SURGICAL PCR SCREEN
MRSA, PCR: POSITIVE — AB
Staphylococcus aureus: POSITIVE — AB

## 2024-01-25 MED ORDER — OXYCODONE-ACETAMINOPHEN 5-325 MG PO TABS
1.0000 | ORAL_TABLET | Freq: Three times a day (TID) | ORAL | 0 refills | Status: AC | PRN
  Filled 2024-01-25 (×2): qty 20, 7d supply, fill #0

## 2024-01-25 MED ORDER — METRONIDAZOLE 500 MG PO TABS
500.0000 mg | ORAL_TABLET | Freq: Two times a day (BID) | ORAL | 0 refills | Status: DC
  Filled 2024-01-25 (×2): qty 14, 7d supply, fill #0

## 2024-01-25 MED ORDER — CIPROFLOXACIN HCL 500 MG PO TABS
500.0000 mg | ORAL_TABLET | Freq: Two times a day (BID) | ORAL | 0 refills | Status: DC
  Filled 2024-01-25 (×2): qty 14, 7d supply, fill #0

## 2024-01-25 NOTE — Progress Notes (Signed)
 Lab called, Pt nasal swab came back +MRSA and +Staphoria bacteria. Bactroban  protocol initiated.

## 2024-01-25 NOTE — Progress Notes (Signed)
 Nurse at bedside,patient alert and oriented times four.Patient states I feel a lot better today.Patient did c/o rectal pain rated a 5,a pressure type of pain that's intermittent,refused pain medications at this time.Plan of care on going.Family at bedside.

## 2024-01-25 NOTE — Progress Notes (Signed)
 "   Subjective: Patient states he is feeling much better. Pain has significantly improved and patient is able to move around better. Sitting down still causes little pain but endorses improvement. Denies fevers and chills.   Objective: Vital signs in last 24 hours: Temp:  [98 F (36.7 C)-99.3 F (37.4 C)] 99.3 F (37.4 C) (01/14 0816) Pulse Rate:  [52-62] 52 (01/14 0816) BP: (122-149)/(56-80) 130/56 (01/14 0816) SpO2:  [96 %-98 %] 96 % (01/14 0816) Last BM Date : 01/23/24  Intake/Output from previous day: 01/13 0701 - 01/14 0700 In: 1496.6 [I.V.:682.5; IV Piggyback:814.1] Out: 1950 [Urine:1950] Intake/Output this shift: Total I/O In: -  Out: 450 [Urine:450]  General appearance: alert, cooperative, and no distress Resp: Patient breathing comfortably on room air in no respiratory distress.  Cardio: regular rate and rhythm, S1, S2 normal, no murmur, click, rub or gallop  Lab Results:  Recent Labs    01/24/24 0500 01/25/24 0501  WBC 20.5* 15.3*  HGB 13.3 12.7*  HCT 41.6 39.3  PLT 289 312   BMET Recent Labs    01/24/24 0500 01/25/24 0501  NA 142 141  K 3.9 3.7  CL 104 104  CO2 29 26  GLUCOSE 105* 99  BUN 12 10  CREATININE 0.68 0.69  CALCIUM 8.5* 8.8*   PT/INR No results for input(s): LABPROT, INR in the last 72 hours.  Studies/Results: US  RENAL Result Date: 01/24/2024 CLINICAL DATA:  Dysuria and difficulty voiding with urinary retention. EXAM: RENAL / URINARY TRACT ULTRASOUND COMPLETE COMPARISON:  None Available. FINDINGS: Right Kidney: Renal measurements: 11.3 x 6.0 x 6.0 cm = volume: 214 mL. Echogenicity within normal limits. No mass or hydronephrosis visualized. Left Kidney: Renal measurements: 12.5 x 6.6 x 6.3 cm = volume: 268 mL. Echogenicity within normal limits. No mass or hydronephrosis visualized. Bladder: Moderate bladder distention with urine. Estimated urinary bladder volume is 649 mL. Other: None. IMPRESSION: 1. Moderate bladder distention with  urine. Estimated urinary bladder volume is 649 mL. 2. Normal appearance of both kidneys. Electronically Signed   By: Marcey Moan M.D.   On: 01/24/2024 14:07   CT ABDOMEN PELVIS W CONTRAST Result Date: 01/23/2024 EXAM: CT ABDOMEN AND PELVIS WITH CONTRAST 01/23/2024 07:03:10 PM TECHNIQUE: CT of the abdomen and pelvis was performed with the administration of 100 mL of iohexol  (OMNIPAQUE ) 300 MG/ML solution. Multiplanar reformatted images are provided for review. Automated exposure control, iterative reconstruction, and/or weight-based adjustment of the mA/kV was utilized to reduce the radiation dose to as low as reasonably achievable. COMPARISON: 03/21/2012 CLINICAL HISTORY: Abdominal pain, acute, nonlocalized. FINDINGS: LOWER CHEST: The bases are clear. LIVER: The liver is unremarkable. GALLBLADDER AND BILE DUCTS: Gallbladder is unremarkable. No biliary ductal dilatation. SPLEEN: No acute abnormality. PANCREAS: No acute abnormality. ADRENAL GLANDS: No acute abnormality. KIDNEYS, URETERS AND BLADDER: Tiny left renal cysts are noted. Per consensus, no follow-up is needed for simple Bosniak type 1 and 2 renal cysts, unless the patient has a malignancy history or risk factors. No obstructive changes are seen. No stones in the kidneys or ureters. The bladder is partially distended. GI AND BOWEL: Small bowel and stomach are unremarkable. No obstructive changes of the colon are noted. The appendix is within normal limits. Inflammatory changes are noted deep in the pelvis in the perirectal fat without significant wall thickening. Some decreased attenuation is noted along the posterior rectum/anus best seen on image number 81 of series 2, which measures approximately 2.6 x 2.4 cm. This likely represents a small retrorectal abscess.  Correlate with physical exam. Alternatively, thrombosed hemorrhoids could present in this fashion. PERITONEUM AND RETROPERITONEUM: No ascites. No free air. VASCULATURE: Aorta is normal in  caliber. LYMPH NODES: No lymphadenopathy. REPRODUCTIVE ORGANS: The prostate is within normal limits. BONES AND SOFT TISSUES: No acute osseous abnormality. No focal soft tissue abnormality. IMPRESSION: 1. Decreased attenuation along the distal rectum/anus measuring approximately 2.6 x 2.4 cm, favored to represent a small retrorectal abscess; thrombosed hemorrhoids could have a similar appearance, and correlation with physical examination is recommended. 2. Inflammatory changes in the deep pelvis perirectal fat without significant wall thickening. Electronically signed by: Oneil Devonshire MD MD 01/23/2024 08:17 PM EST RP Workstation: [id number]    Anti-infectives: Anti-infectives (From admission, onward)    Start     Dose/Rate Route Frequency Ordered Stop   01/24/24 2200  vancomycin  (VANCOREADY) IVPB 1500 mg/300 mL        1,500 mg 150 mL/hr over 120 Minutes Intravenous Every 12 hours 01/24/24 1038     01/24/24 0300  piperacillin -tazobactam (ZOSYN ) IVPB 3.375 g        3.375 g 12.5 mL/hr over 240 Minutes Intravenous Every 8 hours 01/23/24 2149     01/23/24 2200  vancomycin  (VANCOREADY) IVPB 2000 mg/400 mL  Status:  Discontinued        2,000 mg 200 mL/hr over 120 Minutes Intravenous Every 12 hours 01/23/24 2149 01/24/24 1038   01/23/24 2115  piperacillin -tazobactam (ZOSYN ) IVPB 3.375 g  Status:  Discontinued        3.375 g 100 mL/hr over 30 Minutes Intravenous  Once 01/23/24 2112 01/23/24 2115   01/23/24 2115  vancomycin  (VANCOREADY) IVPB 1750 mg/350 mL  Status:  Discontinued        1,750 mg 175 mL/hr over 120 Minutes Intravenous  Once 01/23/24 2112 01/23/24 2149   01/23/24 2030  piperacillin -tazobactam (ZOSYN ) IVPB 3.375 g        3.375 g 100 mL/hr over 30 Minutes Intravenous  Once 01/23/24 2029 01/23/24 2222       Assessment/Plan: Patient overall improving. Pain has improved significantly. Afebrile. WBC decreasing. After conversation with patient, decision was made to forgo I&D due to symptom  improvements. No surgical intervention, recommend ciprofloxacin  and flagyl  for antibiotic treatment. Patient should be good to discharge once foley out and patient is able to void.   Surgery signing off, thank you   Cozetta JINNY Pereyra 01/25/2024  "

## 2024-01-25 NOTE — Progress Notes (Signed)
 Patient was able to void in urinal,patient statesIt was a slight burn when I started to pee,other than that ,I had no issues.Patient was able to void 200 ml's of straw colored urine in urinal. Plan of care on going.

## 2024-01-25 NOTE — Discharge Summary (Signed)
 " Physician Discharge Summary   Patient: [name] West MRN: [id number] DOB: [birth year]  Admit date:     01/23/2024  Discharge date: 01/25/2024  Discharge Physician: Eric Nunnery   PCP: [name], MD   Recommendations at discharge:  Repeat basic metabolic panel to follow ultralights renal function Repeat CBC to assess complete resolution of patient WBCs and assess hemoglobin stability. Reassess blood pressure and adjust antihypertensive as needed Continue assisting patient with weight loss management.  Discharge Diagnoses: Principal Problem:   Abscess of anal and rectal regions Class II obesity Hypertension History of anxiety disorder Obstructive sleep apnea Urinary retention   Brief Hospital admission narrative: 44 year old with history of anxiety, gout, HTN, IBS, OSA has been experiencing rectal pain for almost 2 weeks prior to admission.  CT scan showed concerns of retrorectal abscess.  General surgery was consulted.   Assessment and Plan: 1-retrorectal abscess - No surgical intervention required - Excellent response to antibiotic therapy - Patient will follow-up with general surgery as an outpatient - Continue to maintain adequate hydration and the use of as needed laxative for good bowel regimen.  2-essential hypertension - Resume home antihypertensive agent - Heart healthy/low-sodium diet discussed with patient.  3-class II obesity - Low calorie diet, portion control and increase physical activity discussed with patient -Body mass index is 36.33 kg/m.   4-history of anxiety Continue sertraline .  5-obstructive sleep apnea Continue CPAP nightly.  6-acute urinary retention - Most likely associated with rectal pain/urinary retention from pain medications - Patient in the requiring transient use of Foley catheter; but this was successfully removed and patient voiding without any difficulties at time of discharge. - Advised keeping himself  well-hydrated.  Consultants: General Surgery Procedures performed: See below for x-ray report. Disposition: Home Diet recommendation: Heart healthy/low-sodium diet.  DISCHARGE MEDICATION: Allergies as of 01/25/2024   No Known Allergies      Medication List     TAKE these medications    amLODipine  5 MG tablet Commonly known as: NORVASC  Take 1 tablet (5 mg total) by mouth daily.   cetirizine 5 MG tablet Commonly known as: ZYRTEC Take 5 mg by mouth daily.   ciprofloxacin  500 MG tablet Commonly known as: Cipro  Take 1 tablet (500 mg total) by mouth 2 (two) times daily for 7 days.   metroNIDAZOLE  500 MG tablet Commonly known as: FLAGYL  Take 1 tablet (500 mg total) by mouth 2 (two) times daily for 7 days.   multivitamin with minerals tablet Take 1 tablet by mouth daily.   oxyCODONE -acetaminophen  5-325 MG tablet Commonly known as: PERCOCET/ROXICET Take 1 tablet by mouth every 8 (eight) hours as needed for severe pain (pain score 7-10).   potassium chloride  10 MEQ tablet Commonly known as: Klor-Con  M10 Take 1 tablet (10 mEq total) by mouth daily.   sertraline  100 MG tablet Commonly known as: ZOLOFT  Take 1 tablet (100 mg total) by mouth daily.   valsartan -hydrochlorothiazide  160-25 MG tablet Commonly known as: DIOVAN -HCT Take 1 tablet by mouth daily.        Follow-up Information     [name], MD Follow up.   Specialty: General Surgery Why: As needed Contact information: [address] [place] [address] [phone]         [name], MD. Schedule an appointment as soon as possible for a visit in 10 day(s).   Specialty: Family Medicine Contact information: [address] [place] [address] [phone]  Discharge Exam: Filed Weights   01/23/24 1442 01/23/24 2204  Weight: 108.4 kg 111.6 kg   General exam: Alert, awake, oriented x 3; in no acute distress.  Tolerating diet and expressed moving  bowels without significant discomfort and voiding without difficulties. Respiratory system: Clear to auscultation. Respiratory effort normal. Cardiovascular system:RRR. No murmurs, rubs, gallops. Gastrointestinal system: Abdomen is obese, nondistended, soft and nontender. No organomegaly or masses felt. Normal bowel sounds heard. Central nervous system:No focal neurological deficits. Extremities: No C/C/E, +pedal pulses Skin: No rashes, lesions or ulcers Psychiatry: Judgement and insight appear normal. Mood & affect appropriate.    Condition at discharge: Stable and improved.  The results of significant diagnostics from this hospitalization (including imaging, microbiology, ancillary and laboratory) are listed below for reference.   Imaging Studies: US  RENAL Result Date: 01/24/2024 CLINICAL DATA:  Dysuria and difficulty voiding with urinary retention. EXAM: RENAL / URINARY TRACT ULTRASOUND COMPLETE COMPARISON:  None Available. FINDINGS: Right Kidney: Renal measurements: 11.3 x 6.0 x 6.0 cm = volume: 214 mL. Echogenicity within normal limits. No mass or hydronephrosis visualized. Left Kidney: Renal measurements: 12.5 x 6.6 x 6.3 cm = volume: 268 mL. Echogenicity within normal limits. No mass or hydronephrosis visualized. Bladder: Moderate bladder distention with urine. Estimated urinary bladder volume is 649 mL. Other: None. IMPRESSION: 1. Moderate bladder distention with urine. Estimated urinary bladder volume is 649 mL. 2. Normal appearance of both kidneys. Electronically Signed   By: Marcey Moan M.D.   On: 01/24/2024 14:07   CT ABDOMEN PELVIS W CONTRAST Result Date: 01/23/2024 EXAM: CT ABDOMEN AND PELVIS WITH CONTRAST 01/23/2024 07:03:10 PM TECHNIQUE: CT of the abdomen and pelvis was performed with the administration of 100 mL of iohexol  (OMNIPAQUE ) 300 MG/ML solution. Multiplanar reformatted images are provided for review. Automated exposure control, iterative reconstruction, and/or  weight-based adjustment of the mA/kV was utilized to reduce the radiation dose to as low as reasonably achievable. COMPARISON: 03/21/2012 CLINICAL HISTORY: Abdominal pain, acute, nonlocalized. FINDINGS: LOWER CHEST: The bases are clear. LIVER: The liver is unremarkable. GALLBLADDER AND BILE DUCTS: Gallbladder is unremarkable. No biliary ductal dilatation. SPLEEN: No acute abnormality. PANCREAS: No acute abnormality. ADRENAL GLANDS: No acute abnormality. KIDNEYS, URETERS AND BLADDER: Tiny left renal cysts are noted. Per consensus, no follow-up is needed for simple Bosniak type 1 and 2 renal cysts, unless the patient has a malignancy history or risk factors. No obstructive changes are seen. No stones in the kidneys or ureters. The bladder is partially distended. GI AND BOWEL: Small bowel and stomach are unremarkable. No obstructive changes of the colon are noted. The appendix is within normal limits. Inflammatory changes are noted deep in the pelvis in the perirectal fat without significant wall thickening. Some decreased attenuation is noted along the posterior rectum/anus best seen on image number 81 of series 2, which measures approximately 2.6 x 2.4 cm. This likely represents a small retrorectal abscess. Correlate with physical exam. Alternatively, thrombosed hemorrhoids could present in this fashion. PERITONEUM AND RETROPERITONEUM: No ascites. No free air. VASCULATURE: Aorta is normal in caliber. LYMPH NODES: No lymphadenopathy. REPRODUCTIVE ORGANS: The prostate is within normal limits. BONES AND SOFT TISSUES: No acute osseous abnormality. No focal soft tissue abnormality. IMPRESSION: 1. Decreased attenuation along the distal rectum/anus measuring approximately 2.6 x 2.4 cm, favored to represent a small retrorectal abscess; thrombosed hemorrhoids could have a similar appearance, and correlation with physical examination is recommended. 2. Inflammatory changes in the deep pelvis perirectal fat without significant  wall thickening. Electronically signed  by: Oneil Devonshire MD MD 01/23/2024 08:17 PM EST RP Workstation: [id number]    Microbiology: Results for orders placed or performed during the hospital encounter of 01/23/24  Surgical PCR screen     Status: Abnormal   Collection Time: 01/24/24 11:20 PM   Specimen: Nasal Mucosa; Nasal Swab  Result Value Ref Range Status   MRSA, PCR POSITIVE (A) NEGATIVE Final    Comment: RESULT CALLED TO, READ BACK BY AND VERIFIED WITH: S. STONE [id number], VIRAY,J    Staphylococcus aureus POSITIVE (A) NEGATIVE Final    Comment: RESULT CALLED TO, READ BACK BY AND VERIFIED WITH: S. STONE [id number], VIRAY,J (NOTE) The Xpert SA Assay (FDA approved for NASAL specimens in patients 54 years of age and older), is one component of a comprehensive surveillance program. It is not intended to diagnose infection nor to guide or monitor treatment. Performed at [facility], [address], [place], [place] [address]     Labs: CBC: Recent Labs  Lab 01/23/24 1630 01/24/24 0500 01/25/24 0501  WBC 23.2* 20.5* 15.3*  HGB 13.3 13.3 12.7*  HCT 41.4 41.6 39.3  MCV 87.3 87.4 86.2  PLT 295 289 312   Basic Metabolic Panel: Recent Labs  Lab 01/23/24 1630 01/23/24 1643 01/24/24 0500 01/25/24 0501  NA 141 142 142 141  K 4.2 4.4 3.9 3.7  CL 103 101 104 104  CO2 24 25 29 26   GLUCOSE 101* 106* 105* 99  BUN 11 14 12 10   CREATININE 0.82 0.88 0.68 0.69  CALCIUM 9.2 9.5 8.5* 8.8*  MG  --   --   --  2.6*   Liver Function Tests: Recent Labs  Lab 01/23/24 1643  AST 17  ALT 20  ALKPHOS 99  BILITOT 0.4  PROT 7.4  ALBUMIN 4.2   CBG: No results for input(s): GLUCAP in the last 168 hours.  Discharge time spent:  35 minutes.  Signed: Eric Nunnery, MD [facility] 01/25/2024 "

## 2024-01-25 NOTE — Progress Notes (Addendum)
 Nurse at bedside,16 french coude foley catheter discontinued, per Dr Eric Madera's orders.Balloon was deflated with 10 ml's of saline to return to the syringe,patient tolerated procedure.Urinal provided to patient Plan of care on going.

## 2024-01-25 NOTE — Plan of Care (Signed)
" °  Problem: Education: Goal: Knowledge of General Education information will improve Description: Including pain rating scale, medication(s)/side effects and non-pharmacologic comfort measures Outcome: Progressing   Problem: Clinical Measurements: Goal: Ability to maintain clinical measurements within normal limits will improve Outcome: Progressing Goal: Will remain free from infection Outcome: Progressing Goal: Diagnostic test results will improve Outcome: Progressing Goal: Respiratory complications will improve Outcome: Progressing Goal: Cardiovascular complication will be avoided Outcome: Progressing   Problem: Coping: Goal: Level of anxiety will decrease Outcome: Progressing   Problem: Elimination: Goal: Will not experience complications related to bowel motility Outcome: Progressing Goal: Will not experience complications related to urinary retention Outcome: Progressing   Problem: Pain Managment: Goal: General experience of comfort will improve and/or be controlled Outcome: Progressing   Problem: Skin Integrity: Goal: Risk for impaired skin integrity will decrease Outcome: Progressing   "

## 2024-01-25 NOTE — Progress Notes (Signed)
 Pt concerned he may need to have a stool softener, pts states he hasn't had a BM since 1/12 and with pain meds being given pt concerned his bowels may become backed up, however pt slept well throughout the night despite sacrum discomfort, pt only requested pain med once throughout the shift.

## 2024-01-26 ENCOUNTER — Telehealth: Payer: Self-pay

## 2024-01-26 ENCOUNTER — Ambulatory Visit: Payer: Self-pay | Admitting: Family Medicine

## 2024-01-26 LAB — BASIC METABOLIC PANEL WITH GFR
BUN/Creatinine Ratio: 16 (ref 9–20)
BUN: 14 mg/dL (ref 6–24)
CO2: 25 mmol/L (ref 20–29)
Calcium: 9.5 mg/dL (ref 8.7–10.2)
Chloride: 101 mmol/L (ref 96–106)
Creatinine, Ser: 0.88 mg/dL (ref 0.76–1.27)
Glucose: 106 mg/dL — AB (ref 70–99)
Potassium: 4.4 mmol/L (ref 3.5–5.2)
Sodium: 142 mmol/L (ref 134–144)
eGFR: 109 mL/min/1.73

## 2024-01-26 LAB — MICROALBUMIN / CREATININE URINE RATIO

## 2024-01-26 LAB — HEMOGLOBIN A1C
Est. average glucose Bld gHb Est-mCnc: 105 mg/dL
Hgb A1c MFr Bld: 5.3 % (ref 4.8–5.6)

## 2024-01-26 LAB — LIPID PANEL
Cholesterol, Total: 178 mg/dL (ref 100–199)
HDL: 54 mg/dL
LDL CALC COMMENT:: 3.3 ratio (ref 0.0–5.0)
LDL Chol Calc (NIH): 112 mg/dL — AB (ref 0–99)
Triglycerides: 63 mg/dL (ref 0–149)
VLDL Cholesterol Cal: 12 mg/dL (ref 5–40)

## 2024-01-26 LAB — SPECIMEN STATUS REPORT

## 2024-01-26 LAB — HEPATIC FUNCTION PANEL
ALT: 20 IU/L (ref 0–44)
AST: 17 IU/L (ref 0–40)
Albumin: 4.2 g/dL (ref 4.1–5.1)
Alkaline Phosphatase: 99 IU/L (ref 47–123)
Bilirubin Total: 0.4 mg/dL (ref 0.0–1.2)
Bilirubin, Direct: 0.12 mg/dL (ref 0.00–0.40)
Total Protein: 7.4 g/dL (ref 6.0–8.5)

## 2024-01-26 NOTE — Transitions of Care (Post Inpatient/ED Visit) (Signed)
" ° °  01/26/2024  Name: [name] West MRN: [id number] DOB: [birth year]  Today's TOC FU Call Status: Today's TOC FU Call Status:: Unsuccessful Call (1st Attempt) Unsuccessful Call (1st Attempt) Date: 01/26/24  Attempted to reach the patient regarding the most recent Inpatient/ED visit.  Follow Up Plan: Additional outreach attempts will be made to reach the patient to complete the Transitions of Care (Post Inpatient/ED visit) call.   Signature Julian Lemmings, LPN [facility] Nurse Health Advisor Direct Dial [phone]  "

## 2024-01-27 ENCOUNTER — Telehealth: Payer: Self-pay | Admitting: Emergency Medicine

## 2024-01-27 ENCOUNTER — Other Ambulatory Visit (HOSPITAL_BASED_OUTPATIENT_CLINIC_OR_DEPARTMENT_OTHER): Payer: Self-pay

## 2024-01-27 ENCOUNTER — Ambulatory Visit

## 2024-01-27 ENCOUNTER — Telehealth: Payer: Self-pay | Admitting: Family Medicine

## 2024-01-27 ENCOUNTER — Telehealth: Payer: Self-pay | Admitting: *Deleted

## 2024-01-27 MED ORDER — AMOXICILLIN-POT CLAVULANATE 875-125 MG PO TABS
1.0000 | ORAL_TABLET | Freq: Two times a day (BID) | ORAL | 0 refills | Status: AC
  Filled 2024-01-27: qty 10, 5d supply, fill #0

## 2024-01-27 NOTE — Telephone Encounter (Signed)
 Received call from patient spouse, Diane [phone]~ telephone.   Patient reports severe joint pain in L elbow, L wrist, L ankle, and B shoulders. States that pain began last night.   Patient was discharged home from hospital on Cipro  and Flagyl .   Advised that joint pain can occur as a side effect of certain antibiotics, such as fluoroquinolones.   Discussed with Dr. Mavis. New orders obtained.  Stop Cipro  and Flagyl .  Begin Augmentin  875/ 125 mg PO BID x5 days.  Push fluids to wash out joints.   Call placed to patient and patient/ spouse made aware. Prescription sent to pharmacy.   Advised to follow up as needed.

## 2024-01-27 NOTE — Transitions of Care (Post Inpatient/ED Visit) (Signed)
" ° °  01/27/2024  Name: [name] West MRN: [id number] DOB: Nov 16, 1980  Today's TOC FU Call Status: Today's TOC FU Call Status:: Successful TOC FU Call Completed Unsuccessful Call (1st Attempt) Date: 01/26/24 [facility] FU Call Complete Date: 01/27/24  Patient's Name and Date of Birth confirmed. Name, DOB  Transition Care Management Follow-up Telephone Call Date of Discharge: 01/25/24 [facility] Type of Discharge: Inpatient Admission Primary Inpatient Discharge Diagnosis:: abscess How have you been since you were released from the hospital?: Better Any questions or concerns?: No  Items Reviewed: Did you receive and understand the discharge instructions provided?: Yes Medications obtained,verified, and reconciled?: Yes (Medications Reviewed) Any new allergies since your discharge?: Yes Dietary orders reviewed?: NA Do you have support at home?: Yes People in Home [RPT]: spouse  Medications Reviewed Today: Medications Reviewed Today     Reviewed by [name], LPN (Licensed Practical Nurse) on 01/27/24 at 1141  Med List Status: <None>   Medication Order Taking? Sig Documenting Provider Last Dose Status Informant  amLODipine  (NORVASC ) 5 MG tablet [id number] Yes Take 1 tablet (5 mg total) by mouth daily. [name], MD  Active Self, Spouse/Significant Other, Pharmacy Records, Multiple Informants  amoxicillin -clavulanate (AUGMENTIN ) 875-125 MG tablet [id number] Yes Take 1 tablet by mouth 2 (two) times daily for 5 days. [name], MD  Active   cetirizine (ZYRTEC) 5 MG tablet [id number] Yes Take 5 mg by mouth daily. [provider]  Active Self, Spouse/Significant Other, Pharmacy Records, Multiple Informants  Multiple Vitamins-Minerals (MULTIVITAMIN WITH MINERALS) tablet [id number] Yes Take 1 tablet by mouth daily. [provider]  Active Self, Spouse/Significant Other, Pharmacy Records, Multiple Informants  oxyCODONE -acetaminophen   (PERCOCET/ROXICET) 5-325 MG tablet [id number] Yes Take 1 tablet by mouth every 8 (eight) hours as needed for severe pain (pain score 7-10). [name], MD  Active   potassium chloride  (KLOR-CON  M10) 10 MEQ tablet [id number] Yes Take 1 tablet (10 mEq total) by mouth daily. [name], MD  Active Self, Spouse/Significant Other, Pharmacy Records, Multiple Informants  sertraline  (ZOLOFT ) 100 MG tablet [id number] Yes Take 1 tablet (100 mg total) by mouth daily. [name], MD  Active Self, Spouse/Significant Other, Pharmacy Records, Multiple Informants  valsartan -hydrochlorothiazide  (DIOVAN -HCT) 160-25 MG tablet [id number] Yes Take 1 tablet by mouth daily. [name], MD  Active Self, Spouse/Significant Other, Pharmacy Records, Multiple Informants            Home Care and Equipment/Supplies: Were Home Health Services Ordered?: NA Any new equipment or medical supplies ordered?: NA  Functional Questionnaire: Do you need assistance with bathing/showering or dressing?: No Do you need assistance with meal preparation?: No Do you need assistance with eating?: No Do you have difficulty maintaining continence: No Do you need assistance with getting out of bed/getting out of a chair/moving?: No Do you have difficulty managing or taking your medications?: No  Follow up appointments reviewed: PCP Follow-up appointment confirmed?: Yes Date of PCP follow-up appointment?: 02/03/24 Follow-up Provider: [facility] Follow-up appointment confirmed?: NA Do you need transportation to your follow-up appointment?: No Do you understand care options if your condition(s) worsen?: Yes-patient verbalized understanding    SIGNATURE Pan Emmitt, LPN [facility] Nurse Health Advisor Direct Dial [phone]  "

## 2024-01-27 NOTE — Telephone Encounter (Signed)
 FMLA Paperwork completed and faxed to [employer] at [phone]. Confirmation received.   Patient out of work starting 01/23/2024 and may return unrestricted on 02/06/2024.

## 2024-01-27 NOTE — Telephone Encounter (Signed)
 Encounter opened in error during appointment chart review.

## 2024-01-29 ENCOUNTER — Encounter: Payer: Self-pay | Admitting: Emergency Medicine

## 2024-01-29 ENCOUNTER — Ambulatory Visit
Admission: EM | Admit: 2024-01-29 | Discharge: 2024-01-29 | Disposition: A | Discharge status: Home / Self Care | Attending: Nurse Practitioner | Admitting: Nurse Practitioner

## 2024-01-29 DIAGNOSIS — M2550 Pain in unspecified joint: Secondary | ICD-10-CM | POA: Diagnosis not present

## 2024-01-29 DIAGNOSIS — M2540 Effusion, unspecified joint: Secondary | ICD-10-CM | POA: Diagnosis not present

## 2024-01-29 DIAGNOSIS — T50905A Adverse effect of unspecified drugs, medicaments and biological substances, initial encounter: Secondary | ICD-10-CM | POA: Diagnosis not present

## 2024-01-29 DIAGNOSIS — M791 Myalgia, unspecified site: Secondary | ICD-10-CM

## 2024-01-29 MED ORDER — KETOROLAC TROMETHAMINE 30 MG/ML IJ SOLN
30.0000 mg | Freq: Once | INTRAMUSCULAR | Status: AC
  Administered 2024-01-29: 30 mg via INTRAMUSCULAR

## 2024-01-29 MED ORDER — DEXAMETHASONE SOD PHOSPHATE PF 10 MG/ML IJ SOLN
10.0000 mg | Freq: Once | INTRAMUSCULAR | Status: AC
  Administered 2024-01-29: 10 mg via INTRAMUSCULAR

## 2024-01-29 NOTE — ED Provider Notes (Addendum)
 " [facility]    CSN: [id number] Arrival date & time: 01/29/24  1311      History   Chief Complaint No chief complaint on file.   HPI [name] West is a 44 y.o. male.   The history is provided by the patient.   Patient presents for complaints of generalized joint pain to his entire body with increased pain in the right shoulder and joint swelling and pain of the left wrist.  Patient states symptoms started over the past 2 to 3 days.  States that he was started on Cipro  and Flagyl  for a rectal abscess.  He states shortly after taking 2 doses of Cipro , he developed joint pain.  States that he did reach out to the surgeon who prescribed a medication and was changed to Augmentin .  Patient's wife states the patient started Augmentin  on yesterday.  Patient states that the left wrist has become more swollen over the past 24 hours.  He also states that he has pain in the right shoulder and upper arm, states that he can only raise the right arm to a certain point.  Patient denies injury, trauma, bruising, erythema, chest pain, shortness of breath, difficulty breathing, tongue swelling, throat swelling, abdominal pain, nausea, vomiting, diarrhea, or rash.  Patient spouse states that he has been taking Percocet for pain along with ibuprofen  and Tylenol .  Patient states that he is planning to see his PCP this week. Past Medical History:  Diagnosis Date   Anxiety    Gout    History of kidney stones    passed 1   HNP (herniated nucleus pulposus), lumbar    HTN (hypertension)    Hx of low back pain    IBS (irritable bowel syndrome)    Joint pain    Leg edema    Sleep apnea     Patient Active Problem List   Diagnosis Date Noted   Class 2 obesity 01/25/2024   Abscess of anal and rectal regions 01/23/2024   Spondylosis of cervical joint 07/07/2022   Herniated lumbar disc without myelopathy 02/21/2018   Absolute anemia 02/07/2018   Primary hypertension 02/07/2018   Other  hyperlipidemia 02/07/2018   Vitamin D  deficiency 12/29/2017   Prediabetes 12/29/2017   Sleep apnea 12/22/2016   Obesity (BMI 35.0-39.9 without comorbidity) 05/07/2016   History of kidney stones 11/07/2015   Anxiety as acute reaction to exceptional stress 09/14/2014    Past Surgical History:  Procedure Laterality Date   ANTERIOR CERVICAL DECOMP/DISCECTOMY FUSION N/A 07/07/2022   Procedure: ACDF - C5-C6 - C6-C7;  Surgeon: Onetha Kuba, MD;  Location: [facility] OR;  Service: Neurosurgery;  Laterality: N/A;  3C   BACK SURGERY     CARPAL TUNNEL RELEASE Right 07/07/2022   Procedure: RIGHT CARPAL TUNNEL RELEASE;  Surgeon: Onetha Kuba, MD;  Location: [facility] OR;  Service: Neurosurgery;  Laterality: Right;   KIDNEY STONE SURGERY     LUMBAR LAMINECTOMY/DECOMPRESSION MICRODISCECTOMY Right 02/21/2018   Procedure: Right Lumbar Three-Four Lumbar Four-Five Microdiscectomy;  Surgeon: Unice Pac, MD;  Location: [facility] OR;  Service: Neurosurgery;  Laterality: Right;  Right Lumbar Three-Four Lumbar Four-Five Microdiscectomy       Home Medications    Prior to Admission medications  Medication Sig Start Date End Date Taking? Authorizing Provider  amLODipine  (NORVASC ) 5 MG tablet Take 1 tablet (5 mg total) by mouth daily. 07/25/23   [name], MD  amoxicillin -clavulanate (AUGMENTIN ) 875-125 MG tablet Take 1 tablet by mouth 2 (two) times daily  for 5 days. 01/27/24 02/01/24  [name], MD  cetirizine (ZYRTEC) 5 MG tablet Take 5 mg by mouth daily.    [provider]  Multiple Vitamins-Minerals (MULTIVITAMIN WITH MINERALS) tablet Take 1 tablet by mouth daily.    [provider]  oxyCODONE -acetaminophen  (PERCOCET/ROXICET) 5-325 MG tablet Take 1 tablet by mouth every 8 (eight) hours as needed for severe pain (pain score 7-10). 01/25/24   [name], MD  potassium chloride  (KLOR-CON  M10) 10 MEQ tablet Take 1 tablet (10 mEq total) by mouth daily. 07/25/23   [name], MD  sertraline  (ZOLOFT ) 100 MG  tablet Take 1 tablet (100 mg total) by mouth daily. 07/25/23   [name], MD  valsartan -hydrochlorothiazide  (DIOVAN -HCT) 160-25 MG tablet Take 1 tablet by mouth daily. 07/25/23   [name], MD    Family History Family History  Problem Relation Age of Onset   Cancer Mother        Lung   Hyperlipidemia Father    Heart disease Father    Kidney Stones Father    Heart disease Paternal Grandfather 27       died of MI    Social History Social History[1]   Allergies   Ciprofloxacin    Review of Systems Review of Systems Per HPI  Physical Exam Triage Vital Signs ED Triage Vitals  Encounter Vitals Group     BP 01/29/24 1331 (!) 172/93     Girls Systolic BP Percentile --      Girls Diastolic BP Percentile --      Boys Systolic BP Percentile --      Boys Diastolic BP Percentile --      Pulse Rate 01/29/24 1331 (!) 58     Resp 01/29/24 1331 18     Temp 01/29/24 1331 98.5 F (36.9 C)     Temp Source 01/29/24 1331 Oral     SpO2 01/29/24 1331 95 %     Weight --      Height --      Head Circumference --      Peak Flow --      Pain Score 01/29/24 1334 8     Pain Loc --      Pain Education --      Exclude from Growth Chart --    No data found.  Updated Vital Signs BP (!) 172/93 (BP Location: Right Arm)   Pulse (!) 58   Temp 98.5 F (36.9 C) (Oral)   Resp 18   SpO2 95%   Visual Acuity Right Eye Distance:   Left Eye Distance:   Bilateral Distance:    Right Eye Near:   Left Eye Near:    Bilateral Near:     Physical Exam Vitals and nursing note reviewed.  Constitutional:      General: He is not in acute distress.    Appearance: Normal appearance.  HENT:     Head: Normocephalic.  Eyes:     Extraocular Movements: Extraocular movements intact.     Conjunctiva/sclera: Conjunctivae normal.     Pupils: Pupils are equal, round, and reactive to light.  Cardiovascular:     Rate and Rhythm: Normal rate and regular rhythm.     Pulses: Normal pulses.      Heart sounds: Normal heart sounds.  Pulmonary:     Effort: Pulmonary effort is normal. No respiratory distress.     Breath sounds: Normal breath sounds. No stridor. No wheezing, rhonchi or rales.  Abdominal:  General: Bowel sounds are normal.     Palpations: Abdomen is soft.     Tenderness: There is no abdominal tenderness.  Musculoskeletal:     Right shoulder: Tenderness present. No swelling or deformity. Decreased range of motion. Decreased strength (d/t pain). Normal pulse.     Left wrist: Swelling and tenderness present. No deformity or crepitus. Decreased range of motion. Normal pulse.     Cervical back: Normal range of motion.  Skin:    General: Skin is warm and dry.  Neurological:     General: No focal deficit present.     Mental Status: He is alert and oriented to person, place, and time.  Psychiatric:        Mood and Affect: Mood normal.        Behavior: Behavior normal.      UC Treatments / Results  Labs (all labs ordered are listed, but only abnormal results are displayed) Labs Reviewed - No data to display  EKG   Radiology No results found.  Procedures Procedures (including critical care time)  Medications Ordered in UC Medications  dexamethasone  (DECADRON ) injection 10 mg (10 mg Intramuscular Given 01/29/24 1358)  ketorolac  (TORADOL ) 30 MG/ML injection 30 mg (30 mg Intramuscular Given 01/29/24 1358)    Initial Impression / Assessment and Plan / UC Course  I have reviewed the triage vital signs and the nursing notes.  Pertinent labs & imaging results that were available during my care of the patient were reviewed by me and considered in my medical decision making (see chart for details).  Patient presents for complaints of joint pain and joint swelling after starting Cipro .  On exam, his lung sounds are clear throughout, room air sats are at 95%.  He is well-appearing, and is in no acute distress.  Repeat blood pressure at time of discharge 159/85.   Patient has discontinued Cipro  for the past 2 days he is now taking Augmentin .  He presents today with generalized arthralgia and myalgia.  Will cover for a possible acute inflammatory reaction with Toradol  30 mg IM and Decadron  10 mg IM.  Supportive care recommendations were provided and discussed with the patient to include continuing over-the-counter analgesics, increasing fluids, and rest.  An Ace wrap was provided to the left wrist to help with compression.  Patient was given strict ER follow-up precautions if symptoms fail to improve over the next 12 to 24 hours, or worsen before that time.  Patient is planning to follow-up with his PCP this week.  Patient and spouse were in agreement with this plan of care and verbalized understanding.  All questions were answered.  Patient stable for discharge.  Final Clinical Impressions(s) / UC Diagnoses   Final diagnoses:  Arthralgia, unspecified joint  Joint swelling  Myalgia  Medication reaction, initial encounter     Discharge Instructions      You were given an injection of Decadron  10 mg and Toradol  30 mg.  Do not take any additional NSAIDs today to include ibuprofen , Aleve, Motrin , naproxen, or Advil .  You may take Tylenol  for breakthrough pain or discomfort.  Recommend arthritis strength Tylenol  as this is a higher dosing than the extra strength. Continue to drink plenty of fluids. Recommend the use of ice or heat as needed to the affected areas.  Apply ice for pain or swelling, heat for spasm or stiffness.  Apply for 20 minutes, remove for 1 hour, repeat as needed. As discussed, if your symptoms are not improving over the next  12 to 24 hours, or suddenly worsen before that time, recommend follow-up in the emergency department for further evaluation. Follow-up with your primary care physician this week as scheduled. Follow-up as needed.     ED Prescriptions   None    PDMP not reviewed this encounter.    Gilmer Etta PARAS,  NP 01/29/24 1412     [1]  Social History Tobacco Use   Smoking status: Never   Smokeless tobacco: Never  Vaping Use   Vaping status: Never Used  Substance Use Topics   Alcohol use: Yes    Comment: occas social   Drug use: No     Gilmer Etta PARAS, NP 01/29/24 1413  "

## 2024-01-29 NOTE — Discharge Instructions (Signed)
 You were given an injection of Decadron  10 mg and Toradol  30 mg.  Do not take any additional NSAIDs today to include ibuprofen , Aleve, Motrin , naproxen, or Advil .  You may take Tylenol  for breakthrough pain or discomfort.  Recommend arthritis strength Tylenol  as this is a higher dosing than the extra strength. Continue to drink plenty of fluids. Recommend the use of ice or heat as needed to the affected areas.  Apply ice for pain or swelling, heat for spasm or stiffness.  Apply for 20 minutes, remove for 1 hour, repeat as needed. As discussed, if your symptoms are not improving over the next 12 to 24 hours, or suddenly worsen before that time, recommend follow-up in the emergency department for further evaluation. Follow-up with your primary care physician this week as scheduled. Follow-up as needed.

## 2024-01-29 NOTE — ED Triage Notes (Signed)
 Had allergic reaction to Cipro  while being in the hospital.  States joints are hurting.  Started with ankle pain, wrist, shoulders and elbows.  States left wrist is swollen and can't lift right arm due to right shoulder pain.  Is currently taking Augmentin 

## 2024-02-03 ENCOUNTER — Ambulatory Visit: Admitting: Family Medicine

## 2024-02-03 VITALS — BP 150/90 | HR 63 | Temp 98.4°F | Ht 69.0 in | Wt 259.4 lb

## 2024-02-03 DIAGNOSIS — K612 Anorectal abscess: Secondary | ICD-10-CM

## 2024-02-03 DIAGNOSIS — R1031 Right lower quadrant pain: Secondary | ICD-10-CM | POA: Diagnosis not present

## 2024-02-03 DIAGNOSIS — I10 Essential (primary) hypertension: Secondary | ICD-10-CM

## 2024-02-03 NOTE — Progress Notes (Addendum)
" ° °  Subjective:    Patient ID: [name] West, male    DOB: [birth year], 44 y.o.   MRN: [id number]  HPI Patient is here for a hospital follow up Recently hospitalized Had a rectal abscess Have a lot of lower pelvic pain discomfort or pain with bowel movements and inability to urinate No high fever chills or sweats since being out of the hospital Feels like he is doing better He did have allergic reaction/side effects to Cipro  and was switched over to Augmentin  Patient stated he was there because of having a mass right above his rectum area that was causing his nerve to swell and making it difficult to urinate.   Patient was prescribed an antibiotic and it had cleared up    Review of Systems     Objective:   Physical Exam General-in no acute distress Eyes-no discharge Lungs-respiratory rate normal, CTA CV-no murmurs,RRR Extremities skin warm dry no edema Neuro grossly normal Behavior normal, alert Increased pain in right groin region with lateral movement more than likely ligament strain rectal area does not show any sign of abscess       Assessment & Plan:  1. Essential hypertension (Primary) Continue medication check blood pressure at home send us  some readings may need to adjust medicine follow-up by summertime - Basic metabolic panel with GFR  2. Abscess of anal and rectal regions Labs ordered Referral to gastroenterology for colonoscopy  - CBC with Differential/Platelet - Ambulatory referral to Gastroenterology  3. Right groin pain Gentle stretching exercises check lab work - Sedimentation rate  Patient was educated that if he starts having symptoms again to notify us  immediately to be placed on antibiotics possible scan and if he has a reoccurrence of the abscess more than likely would need surgery "

## 2024-02-04 LAB — CBC WITH DIFFERENTIAL/PLATELET
Basophils Absolute: 0.1 10*3/uL (ref 0.0–0.2)
Basos: 0 %
EOS (ABSOLUTE): 0.5 10*3/uL — ABNORMAL HIGH (ref 0.0–0.4)
Eos: 3 %
Hematocrit: 37.6 % (ref 37.5–51.0)
Hemoglobin: 12 g/dL — ABNORMAL LOW (ref 13.0–17.7)
Immature Grans (Abs): 0.2 10*3/uL — ABNORMAL HIGH (ref 0.0–0.1)
Immature Granulocytes: 1 %
Lymphocytes Absolute: 2.1 10*3/uL (ref 0.7–3.1)
Lymphs: 14 %
MCH: 27.9 pg (ref 26.6–33.0)
MCHC: 31.9 g/dL (ref 31.5–35.7)
MCV: 87 fL (ref 79–97)
Monocytes Absolute: 1 10*3/uL — ABNORMAL HIGH (ref 0.1–0.9)
Monocytes: 7 %
Neutrophils Absolute: 10.7 10*3/uL — ABNORMAL HIGH (ref 1.4–7.0)
Neutrophils: 75 %
Platelets: 305 10*3/uL (ref 150–450)
RBC: 4.3 x10E6/uL (ref 4.14–5.80)
RDW: 13.2 % (ref 11.6–15.4)
WBC: 14.4 10*3/uL — ABNORMAL HIGH (ref 3.4–10.8)

## 2024-02-04 LAB — BASIC METABOLIC PANEL WITH GFR
BUN/Creatinine Ratio: 16 (ref 9–20)
BUN: 14 mg/dL (ref 6–24)
CO2: 26 mmol/L (ref 20–29)
Calcium: 8.9 mg/dL (ref 8.7–10.2)
Chloride: 101 mmol/L (ref 96–106)
Creatinine, Ser: 0.87 mg/dL (ref 0.76–1.27)
Glucose: 79 mg/dL (ref 70–99)
Potassium: 4.9 mmol/L (ref 3.5–5.2)
Sodium: 138 mmol/L (ref 134–144)
eGFR: 108 mL/min/{1.73_m2}

## 2024-02-04 LAB — SEDIMENTATION RATE: Sed Rate: 39 mm/h — ABNORMAL HIGH (ref 0–15)

## 2024-02-06 ENCOUNTER — Ambulatory Visit: Payer: Self-pay | Admitting: Family Medicine

## 2024-02-08 NOTE — Telephone Encounter (Signed)
 STD Paperwork completed and faxed to [employer] at [phone]. Confirmation received.   Patient out of work starting 01/23/2024 and may return unrestricted on 02/06/2024.

## 2024-08-03 ENCOUNTER — Ambulatory Visit: Admitting: Family Medicine
